# Patient Record
Sex: Male | Born: 1945 | Race: White | Hispanic: No | Marital: Married | Smoking: Former smoker
Health system: Southern US, Community
[De-identification: ages and names within clinical notes are randomized; demographics above are authoritative.]

## PROBLEM LIST (undated history)

## (undated) DIAGNOSIS — J449 Chronic obstructive pulmonary disease, unspecified: Secondary | ICD-10-CM

## (undated) DIAGNOSIS — I10 Essential (primary) hypertension: Secondary | ICD-10-CM

## (undated) DIAGNOSIS — I219 Acute myocardial infarction, unspecified: Secondary | ICD-10-CM

## (undated) DIAGNOSIS — G4730 Sleep apnea, unspecified: Secondary | ICD-10-CM

## (undated) DIAGNOSIS — I509 Heart failure, unspecified: Secondary | ICD-10-CM

## (undated) DIAGNOSIS — C3490 Malignant neoplasm of unspecified part of unspecified bronchus or lung: Secondary | ICD-10-CM

## (undated) DIAGNOSIS — K219 Gastro-esophageal reflux disease without esophagitis: Secondary | ICD-10-CM

## (undated) DIAGNOSIS — K2980 Duodenitis without bleeding: Secondary | ICD-10-CM

## (undated) DIAGNOSIS — M1990 Unspecified osteoarthritis, unspecified site: Secondary | ICD-10-CM

## (undated) DIAGNOSIS — I2510 Atherosclerotic heart disease of native coronary artery without angina pectoris: Secondary | ICD-10-CM

## (undated) DIAGNOSIS — C801 Malignant (primary) neoplasm, unspecified: Secondary | ICD-10-CM

## (undated) DIAGNOSIS — K5790 Diverticulosis of intestine, part unspecified, without perforation or abscess without bleeding: Secondary | ICD-10-CM

## (undated) DIAGNOSIS — E7800 Pure hypercholesterolemia, unspecified: Secondary | ICD-10-CM

## (undated) DIAGNOSIS — T8859XA Other complications of anesthesia, initial encounter: Secondary | ICD-10-CM

## (undated) DIAGNOSIS — K635 Polyp of colon: Secondary | ICD-10-CM

## (undated) DIAGNOSIS — I499 Cardiac arrhythmia, unspecified: Secondary | ICD-10-CM

## (undated) DIAGNOSIS — K648 Other hemorrhoids: Secondary | ICD-10-CM

## (undated) DIAGNOSIS — E119 Type 2 diabetes mellitus without complications: Secondary | ICD-10-CM

## (undated) HISTORY — PX: BACK SURGERY: SHX140

## (undated) HISTORY — PX: CORONARY ANGIOPLASTY: SHX604

---

## 2009-08-08 ENCOUNTER — Ambulatory Visit: Payer: Self-pay | Admitting: Family Medicine

## 2009-09-14 ENCOUNTER — Ambulatory Visit: Payer: Self-pay | Admitting: Oncology

## 2009-09-17 HISTORY — PX: OTHER SURGICAL HISTORY: SHX169

## 2009-09-17 HISTORY — PX: CORONARY ARTERY BYPASS GRAFT: SHX141

## 2009-09-19 ENCOUNTER — Ambulatory Visit: Payer: Self-pay | Admitting: Cardiovascular Disease

## 2009-10-10 LAB — CBC WITH DIFFERENTIAL (CANCER CENTER ONLY)
BASO#: 0 10*3/uL (ref 0.0–0.2)
BASO%: 0.5 % (ref 0.0–2.0)
EOS%: 4 % (ref 0.0–7.0)
Eosinophils Absolute: 0.3 10*3/uL (ref 0.0–0.5)
HCT: 36.5 % — ABNORMAL LOW (ref 38.7–49.9)
HGB: 12.6 g/dL — ABNORMAL LOW (ref 13.0–17.1)
LYMPH#: 1.4 10*3/uL (ref 0.9–3.3)
LYMPH%: 22.6 % (ref 14.0–48.0)
MCH: 29.6 pg (ref 28.0–33.4)
MCHC: 34.6 g/dL (ref 32.0–35.9)
MCV: 86 fL (ref 82–98)
MONO#: 0.4 10*3/uL (ref 0.1–0.9)
MONO%: 6.2 % (ref 0.0–13.0)
NEUT#: 4.2 10*3/uL (ref 1.5–6.5)
NEUT%: 66.7 % (ref 40.0–80.0)
Platelets: 361 10*3/uL (ref 145–400)
RBC: 4.26 10*6/uL (ref 4.20–5.70)
RDW: 13.6 % (ref 10.5–14.6)
WBC: 6.3 10*3/uL (ref 4.0–10.0)

## 2009-10-10 LAB — CMP (CANCER CENTER ONLY)
ALT(SGPT): 40 U/L (ref 10–47)
AST: 29 U/L (ref 11–38)
Albumin: 3.7 g/dL (ref 3.3–5.5)
Alkaline Phosphatase: 93 U/L — ABNORMAL HIGH (ref 26–84)
BUN, Bld: 25 mg/dL — ABNORMAL HIGH (ref 7–22)
CO2: 29 mEq/L (ref 18–33)
Calcium: 9.2 mg/dL (ref 8.0–10.3)
Chloride: 100 mEq/L (ref 98–108)
Creat: 0.8 mg/dl (ref 0.6–1.2)
Glucose, Bld: 142 mg/dL — ABNORMAL HIGH (ref 73–118)
Potassium: 4.8 mEq/L — ABNORMAL HIGH (ref 3.3–4.7)
Sodium: 147 mEq/L — ABNORMAL HIGH (ref 128–145)
Total Bilirubin: 0.4 mg/dl (ref 0.20–1.60)
Total Protein: 7.6 g/dL (ref 6.4–8.1)

## 2009-10-10 LAB — LACTATE DEHYDROGENASE: LDH: 172 U/L (ref 94–250)

## 2009-12-20 ENCOUNTER — Ambulatory Visit: Payer: Self-pay | Admitting: Oncology

## 2010-01-03 LAB — CBC WITH DIFFERENTIAL (CANCER CENTER ONLY)
BASO#: 0 10*3/uL (ref 0.0–0.2)
BASO%: 0.5 % (ref 0.0–2.0)
EOS%: 2.3 % (ref 0.0–7.0)
Eosinophils Absolute: 0.1 10*3/uL (ref 0.0–0.5)
HCT: 34.3 % — ABNORMAL LOW (ref 38.7–49.9)
HGB: 12.1 g/dL — ABNORMAL LOW (ref 13.0–17.1)
LYMPH#: 1.4 10*3/uL (ref 0.9–3.3)
LYMPH%: 23.9 % (ref 14.0–48.0)
MCH: 30.1 pg (ref 28.0–33.4)
MCHC: 35.3 g/dL (ref 32.0–35.9)
MCV: 85 fL (ref 82–98)
MONO#: 0.5 10*3/uL (ref 0.1–0.9)
MONO%: 9 % (ref 0.0–13.0)
NEUT#: 3.7 10*3/uL (ref 1.5–6.5)
NEUT%: 64.3 % (ref 40.0–80.0)
Platelets: 229 10*3/uL (ref 145–400)
RBC: 4.02 10*6/uL — ABNORMAL LOW (ref 4.20–5.70)
RDW: 13.5 % (ref 10.5–14.6)
WBC: 5.7 10*3/uL (ref 4.0–10.0)

## 2010-01-03 LAB — CMP (CANCER CENTER ONLY)
ALT(SGPT): 23 U/L (ref 10–47)
AST: 21 U/L (ref 11–38)
Albumin: 3.9 g/dL (ref 3.3–5.5)
Alkaline Phosphatase: 73 U/L (ref 26–84)
BUN, Bld: 18 mg/dL (ref 7–22)
CO2: 27 mEq/L (ref 18–33)
Calcium: 8.9 mg/dL (ref 8.0–10.3)
Chloride: 103 mEq/L (ref 98–108)
Creat: 0.9 mg/dl (ref 0.6–1.2)
Glucose, Bld: 263 mg/dL — ABNORMAL HIGH (ref 73–118)
Potassium: 4.7 mEq/L (ref 3.3–4.7)
Sodium: 137 mEq/L (ref 128–145)
Total Bilirubin: 0.5 mg/dl (ref 0.20–1.60)
Total Protein: 7 g/dL (ref 6.4–8.1)

## 2010-01-15 ENCOUNTER — Ambulatory Visit: Payer: Self-pay | Admitting: Oncology

## 2011-12-01 ENCOUNTER — Ambulatory Visit: Payer: Self-pay | Admitting: Rheumatology

## 2011-12-12 ENCOUNTER — Other Ambulatory Visit: Payer: Self-pay | Admitting: Neurological Surgery

## 2011-12-15 NOTE — Pre-Procedure Instructions (Signed)
19 Erik Collins  12/15/2011   Your procedure is scheduled on:  Wednesday October 30   Report to [facility] at 1:00 PM.  Call this number if you have problems the morning of surgery: [phone]   Remember:   Do not eat or drink:After Midnight.    Take these medicines the morning of surgery with A SIP OF WATER: Carvedilol (Coreg), omeprazole (Prilosec). Flexeril (cyclobenzaprine) and Ultram (tramadol) if needed.    Do not wear jewelry, make-up or nail polish.  Do not wear lotions, powders, or perfumes. You may wear deodorant.  Do not shave 48 hours prior to surgery. Men may shave face and neck.  Do not bring valuables to the hospital.  Contacts, dentures or bridgework may not be worn into surgery.  Leave suitcase in the car. After surgery it may be brought to your room.  For patients admitted to the hospital, checkout time is 11:00 AM the day of discharge.   Patients discharged the day of surgery will not be allowed to drive home.  Name and phone number of your driver: NA  Special Instructions: Shower using CHG 2 nights before surgery and the night before surgery.  If you shower the day of surgery use CHG.  Use special wash - you have one bottle of CHG for all showers.  You should use approximately 1/3 of the bottle for each shower.   Please read over the following fact sheets that you were given: Pain Booklet, Coughing and Deep Breathing and Surgical Site Infection Prevention

## 2011-12-16 ENCOUNTER — Encounter (HOSPITAL_COMMUNITY)
Admission: RE | Admit: 2011-12-16 | Discharge: 2011-12-16 | Disposition: A | Discharge status: Home / Self Care | Payer: Medicare Other | Source: Ambulatory Visit | Attending: Neurological Surgery | Admitting: Neurological Surgery

## 2011-12-16 ENCOUNTER — Encounter (HOSPITAL_COMMUNITY): Payer: Self-pay

## 2011-12-16 HISTORY — DX: Type 2 diabetes mellitus without complications: E11.9

## 2011-12-16 HISTORY — DX: Pure hypercholesterolemia, unspecified: E78.00

## 2011-12-16 HISTORY — DX: Chronic obstructive pulmonary disease, unspecified: J44.9

## 2011-12-16 HISTORY — DX: Atherosclerotic heart disease of native coronary artery without angina pectoris: I25.10

## 2011-12-16 HISTORY — DX: Essential (primary) hypertension: I10

## 2011-12-16 HISTORY — DX: Gastro-esophageal reflux disease without esophagitis: K21.9

## 2011-12-16 HISTORY — DX: Malignant (primary) neoplasm, unspecified: C80.1

## 2011-12-16 HISTORY — DX: Acute myocardial infarction, unspecified: I21.9

## 2011-12-16 LAB — CBC WITH DIFFERENTIAL/PLATELET
Basophils Absolute: 0 10*3/uL (ref 0.0–0.1)
Basophils Relative: 0 % (ref 0–1)
Eosinophils Absolute: 0.1 10*3/uL (ref 0.0–0.7)
Eosinophils Relative: 1 % (ref 0–5)
HCT: 36.9 % — ABNORMAL LOW (ref 39.0–52.0)
Hemoglobin: 12.4 g/dL — ABNORMAL LOW (ref 13.0–17.0)
Lymphocytes Relative: 29 % (ref 12–46)
Lymphs Abs: 1.8 10*3/uL (ref 0.7–4.0)
MCH: 30.2 pg (ref 26.0–34.0)
MCHC: 33.6 g/dL (ref 30.0–36.0)
MCV: 89.8 fL (ref 78.0–100.0)
Monocytes Absolute: 0.5 10*3/uL (ref 0.1–1.0)
Monocytes Relative: 8 % (ref 3–12)
Neutro Abs: 3.9 10*3/uL (ref 1.7–7.7)
Neutrophils Relative %: 62 % (ref 43–77)
Platelets: 188 10*3/uL (ref 150–400)
RBC: 4.11 MIL/uL — ABNORMAL LOW (ref 4.22–5.81)
RDW: 13.4 % (ref 11.5–15.5)
WBC: 6.2 10*3/uL (ref 4.0–10.5)

## 2011-12-16 LAB — PROTIME-INR
INR: 0.97 (ref 0.00–1.49)
Prothrombin Time: 12.8 seconds (ref 11.6–15.2)

## 2011-12-16 LAB — BASIC METABOLIC PANEL
BUN: 18 mg/dL (ref 6–23)
CO2: 26 mEq/L (ref 19–32)
Calcium: 9.3 mg/dL (ref 8.4–10.5)
Chloride: 99 mEq/L (ref 96–112)
Creatinine, Ser: 0.94 mg/dL (ref 0.50–1.35)
GFR calc Af Amer: >90 mL/min (ref >90)
GFR calc non Af Amer: 85 mL/min — ABNORMAL LOW (ref >90)
Glucose, Bld: 233 mg/dL — ABNORMAL HIGH (ref 70–99)
Potassium: 4.4 mEq/L (ref 3.5–5.1)
Sodium: 134 mEq/L — ABNORMAL LOW (ref 135–145)

## 2011-12-16 LAB — SURGICAL PCR SCREEN
MRSA, PCR: NEGATIVE
Staphylococcus aureus: POSITIVE — AB

## 2011-12-16 MED ORDER — DEXAMETHASONE SODIUM PHOSPHATE 10 MG/ML IJ SOLN: 10.0000 mg | INTRAMUSCULAR | Status: DC

## 2011-12-16 MED ORDER — CEFAZOLIN SODIUM-DEXTROSE 2-3 GM-% IV SOLR: 2.0000 g | INTRAVENOUS | Status: DC

## 2011-12-16 NOTE — Progress Notes (Addendum)
Refaxed request for medical records - [facility], Drs. Welton Flakes (PCP and cardiologist).  Will leave chart for review by anesthesia.   Records were eventually received from [facility], however EKG and CXR were both more than one year old.

## 2011-12-16 NOTE — Progress Notes (Addendum)
Pt states EKG and CXR done within last year. Requested records from [facility]: EKG, CXR, last OV, cardiac cath, ECHO, stress test as available.   Pt reported brief episode of sharp right sided chest pain several days ago, denied any sweating, SOB, or feeling unwell in any other way. States did not feel like his heart attack in 09/2009.

## 2011-12-16 NOTE — Consult Note (Signed)
Anesthesia Chart Review:  Patient is a 66 year old male scheduled for a right L5-S1 microdiskectomy on 12/17/11 (afternoon) by Dr. Yetta Barre.  His PAT appointment was on 12/16/11.  I was not asked to see him at his PAT visit.  I was given his chart to review at 1400; however I still do not have any cardiac records, EKG, or CXR to review.  (Records re-requested and a message was left for Erie Noe at Dr. Yetta Barre' office.)  There is no answer at the phone number listed for the patient.   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    Labs noted.  Glucose is 233.  I will follow-up with records when available.  Shonna Chock, PA-C 12/16/11 1401  Update: 12/16/11 1527 I just received most recent records from [facility] ([facility])/(Cardiologist Dr. Welton Flakes).  Patient was last seen on 03/11/11.  Notes indicate patient underwent CABG X 3 (LIMA to LAD, SVG to OM1, SVG to RCA) and "lobectomy" for stage I adenocarcinoma of the lung in August 2011 at [facility].  He had a stress and echo done at [facility] in January 2013 (see below).  Echo on 03/11/11 showed mildly dilated left atrium with rest of the chambers of normal size. Borderline normal LV systolic function, EF 50%. Normal wall motion. Mild left ventricular hypertrophy with mild diastolic dysfunction. Mild tricuspid regurgitation. Normal pulmonary artery pressure. Mild mitral regurgitation. Fibro-calcified aortic valve without evidence of stenosis. Mild aortic regurgitation. No pericardial effusion.  Nuclear stress test on 03/10/11 showed EF 50%. Fixed infero-apical defect most likely due to prior infarct versus diaphragmatic attenuation.  His last cardiac cath on 09/19/09 was done at [facility] prior to his CABG and showed three-vessel CAD with occluded proximal left circumflex with collaterals from the LAD, which itself has eccentric proximal high-grade lesion. RCA also  had 2 mid high-grade lesions with LVEF 40%.  His EKG and CXR from [facility] were both > 62 year old, so he will need both repeated on arrival.  I reviewed above with Anesthesiologist Dr. Katrinka Blazing.  Clinical correlation on the day of surgery.  If he is asymptomatic or at baseline from a cardiopulmonary standpoint then anticipate he can proceed.  (There is no record that he sees a Pulmonologist, but I did ask his Short Stay nurse to request these records tomorrow, if applicable.)  Shonna Chock, PA-C

## 2011-12-17 ENCOUNTER — Inpatient Hospital Stay (HOSPITAL_COMMUNITY): Payer: Medicare Other

## 2011-12-17 ENCOUNTER — Encounter (HOSPITAL_COMMUNITY): Payer: Self-pay | Admitting: Vascular Surgery

## 2011-12-17 ENCOUNTER — Inpatient Hospital Stay (HOSPITAL_COMMUNITY): Payer: Medicare Other | Admitting: Vascular Surgery

## 2011-12-17 ENCOUNTER — Encounter (HOSPITAL_COMMUNITY): Payer: Self-pay | Admitting: *Deleted

## 2011-12-17 ENCOUNTER — Encounter (HOSPITAL_COMMUNITY)
Admission: RE | Disposition: A | Discharge status: Home / Self Care | Payer: Self-pay | Source: Ambulatory Visit | Attending: Neurological Surgery

## 2011-12-17 ENCOUNTER — Inpatient Hospital Stay (HOSPITAL_COMMUNITY)
Admission: RE | Admit: 2011-12-17 | Discharge: 2011-12-17 | Disposition: A | Discharge status: Home / Self Care | Payer: Medicare Other | Source: Ambulatory Visit | Attending: Neurological Surgery | Admitting: Neurological Surgery

## 2011-12-17 ENCOUNTER — Inpatient Hospital Stay (HOSPITAL_COMMUNITY)
Admission: RE | Admit: 2011-12-17 | Discharge: 2011-12-17 | DRG: 491 | Disposition: A | Discharge status: Home / Self Care | Payer: Medicare Other | Source: Ambulatory Visit | Attending: Neurological Surgery | Admitting: Neurological Surgery

## 2011-12-17 ENCOUNTER — Encounter (HOSPITAL_COMMUNITY): Payer: Self-pay | Admitting: Neurological Surgery

## 2011-12-17 DIAGNOSIS — I10 Essential (primary) hypertension: Secondary | ICD-10-CM | POA: Diagnosis present

## 2011-12-17 DIAGNOSIS — J4489 Other specified chronic obstructive pulmonary disease: Secondary | ICD-10-CM | POA: Diagnosis present

## 2011-12-17 DIAGNOSIS — Z7982 Long term (current) use of aspirin: Secondary | ICD-10-CM

## 2011-12-17 DIAGNOSIS — Z85118 Personal history of other malignant neoplasm of bronchus and lung: Secondary | ICD-10-CM

## 2011-12-17 DIAGNOSIS — J449 Chronic obstructive pulmonary disease, unspecified: Secondary | ICD-10-CM | POA: Diagnosis present

## 2011-12-17 DIAGNOSIS — K219 Gastro-esophageal reflux disease without esophagitis: Secondary | ICD-10-CM | POA: Diagnosis present

## 2011-12-17 DIAGNOSIS — Z951 Presence of aortocoronary bypass graft: Secondary | ICD-10-CM

## 2011-12-17 DIAGNOSIS — E119 Type 2 diabetes mellitus without complications: Secondary | ICD-10-CM | POA: Diagnosis present

## 2011-12-17 DIAGNOSIS — I2510 Atherosclerotic heart disease of native coronary artery without angina pectoris: Secondary | ICD-10-CM | POA: Diagnosis present

## 2011-12-17 DIAGNOSIS — M5126 Other intervertebral disc displacement, lumbar region: Principal | ICD-10-CM | POA: Diagnosis present

## 2011-12-17 DIAGNOSIS — E7800 Pure hypercholesterolemia, unspecified: Secondary | ICD-10-CM | POA: Diagnosis present

## 2011-12-17 DIAGNOSIS — Z79899 Other long term (current) drug therapy: Secondary | ICD-10-CM

## 2011-12-17 DIAGNOSIS — Z01812 Encounter for preprocedural laboratory examination: Secondary | ICD-10-CM

## 2011-12-17 DIAGNOSIS — I252 Old myocardial infarction: Secondary | ICD-10-CM

## 2011-12-17 HISTORY — PX: LUMBAR LAMINECTOMY/DECOMPRESSION MICRODISCECTOMY: SHX5026

## 2011-12-17 LAB — GLUCOSE, CAPILLARY
Glucose-Capillary: 125 mg/dL — ABNORMAL HIGH (ref 70–99)
Glucose-Capillary: 83 mg/dL (ref 70–99)

## 2011-12-17 SURGERY — LUMBAR LAMINECTOMY/DECOMPRESSION MICRODISCECTOMY 1 LEVEL
Anesthesia: General | Site: Spine Lumbar | Laterality: Right | Wound class: Clean

## 2011-12-17 MED ORDER — MEPERIDINE HCL 25 MG/ML IJ SOLN: 6.2500 mg | INTRAMUSCULAR | Status: DC | PRN

## 2011-12-17 MED ORDER — ACETAMINOPHEN 325 MG PO TABS: 650.0000 mg | ORAL_TABLET | ORAL | Status: DC | PRN

## 2011-12-17 MED ORDER — OXYCODONE HCL 5 MG PO TABS: 5.0000 mg | ORAL_TABLET | Freq: Once | ORAL | Status: DC | PRN

## 2011-12-17 MED ORDER — ACETAMINOPHEN 650 MG RE SUPP: 650.0000 mg | RECTAL | Status: DC | PRN

## 2011-12-17 MED ORDER — ENALAPRIL MALEATE 5 MG PO TABS: 5.0000 mg | ORAL_TABLET | Freq: Every day | ORAL | Status: DC

## 2011-12-17 MED ORDER — PHENOL 1.4 % MT LIQD: 1.0000 | OROMUCOSAL | Status: DC | PRN

## 2011-12-17 MED ORDER — SODIUM CHLORIDE 0.9 % IV SOLN
INTRAVENOUS | Status: AC
  Filled 2011-12-17: qty 500

## 2011-12-17 MED ORDER — HYDROMORPHONE HCL PF 1 MG/ML IJ SOLN
0.2500 mg | INTRAMUSCULAR | Status: DC | PRN
  Administered 2011-12-17: 0.5 mg via INTRAVENOUS

## 2011-12-17 MED ORDER — SODIUM CHLORIDE 0.9 % IR SOLN
Status: DC | PRN
  Administered 2011-12-17: 15:00:00

## 2011-12-17 MED ORDER — CARVEDILOL 25 MG PO TABS
25.0000 mg | ORAL_TABLET | Freq: Two times a day (BID) | ORAL | Status: DC
  Filled 2011-12-17: qty 1

## 2011-12-17 MED ORDER — ONDANSETRON HCL 4 MG/2ML IJ SOLN
INTRAMUSCULAR | Status: DC | PRN
  Administered 2011-12-17: 4 mg via INTRAVENOUS

## 2011-12-17 MED ORDER — ARTIFICIAL TEARS OP OINT
TOPICAL_OINTMENT | OPHTHALMIC | Status: DC | PRN
  Administered 2011-12-17: 1 via OPHTHALMIC

## 2011-12-17 MED ORDER — METFORMIN HCL 500 MG PO TABS
1000.0000 mg | ORAL_TABLET | Freq: Two times a day (BID) | ORAL | Status: DC
  Filled 2011-12-17: qty 2

## 2011-12-17 MED ORDER — CEFAZOLIN SODIUM 1-5 GM-% IV SOLN
1.0000 g | Freq: Three times a day (TID) | INTRAVENOUS | Status: DC
  Filled 2011-12-17 (×2): qty 50

## 2011-12-17 MED ORDER — DEXAMETHASONE 4 MG PO TABS: 4.0000 mg | ORAL_TABLET | Freq: Four times a day (QID) | ORAL | Status: DC

## 2011-12-17 MED ORDER — HYDROCODONE-ACETAMINOPHEN 5-325 MG PO TABS
1.0000 | ORAL_TABLET | ORAL | Status: DC | PRN
  Administered 2011-12-17: 2 via ORAL
  Filled 2011-12-17: qty 2

## 2011-12-17 MED ORDER — MIDAZOLAM HCL 5 MG/5ML IJ SOLN
INTRAMUSCULAR | Status: DC | PRN
  Administered 2011-12-17: 1 mg via INTRAVENOUS

## 2011-12-17 MED ORDER — LACTATED RINGERS IV SOLN
INTRAVENOUS | Status: DC | PRN
  Administered 2011-12-17 (×2): via INTRAVENOUS

## 2011-12-17 MED ORDER — PROMETHAZINE HCL 25 MG/ML IJ SOLN: 6.2500 mg | INTRAMUSCULAR | Status: DC | PRN

## 2011-12-17 MED ORDER — ONDANSETRON HCL 4 MG/2ML IJ SOLN: 4.0000 mg | INTRAMUSCULAR | Status: DC | PRN

## 2011-12-17 MED ORDER — GLYCOPYRROLATE 0.2 MG/ML IJ SOLN
INTRAMUSCULAR | Status: DC | PRN
  Administered 2011-12-17: .6 mg via INTRAVENOUS

## 2011-12-17 MED ORDER — HYDROMORPHONE HCL PF 1 MG/ML IJ SOLN
INTRAMUSCULAR | Status: AC
  Filled 2011-12-17: qty 1

## 2011-12-17 MED ORDER — LIDOCAINE HCL (CARDIAC) 20 MG/ML IV SOLN
INTRAVENOUS | Status: DC | PRN
  Administered 2011-12-17: 50 mg via INTRAVENOUS

## 2011-12-17 MED ORDER — SODIUM CHLORIDE 0.9 % IJ SOLN: 3.0000 mL | Freq: Two times a day (BID) | INTRAMUSCULAR | Status: DC

## 2011-12-17 MED ORDER — ROCURONIUM BROMIDE 100 MG/10ML IV SOLN
INTRAVENOUS | Status: DC | PRN
  Administered 2011-12-17: 50 mg via INTRAVENOUS

## 2011-12-17 MED ORDER — MORPHINE SULFATE 2 MG/ML IJ SOLN
1.0000 mg | INTRAMUSCULAR | Status: DC | PRN
  Administered 2011-12-17: 2 mg via INTRAVENOUS
  Filled 2011-12-17: qty 1

## 2011-12-17 MED ORDER — CLONAZEPAM 0.5 MG PO TABS: 1.0000 mg | ORAL_TABLET | Freq: Every evening | ORAL | Status: DC | PRN

## 2011-12-17 MED ORDER — MENTHOL 3 MG MT LOZG: 1.0000 | LOZENGE | OROMUCOSAL | Status: DC | PRN

## 2011-12-17 MED ORDER — PANTOPRAZOLE SODIUM 40 MG PO TBEC: 40.0000 mg | DELAYED_RELEASE_TABLET | Freq: Every day | ORAL | Status: DC

## 2011-12-17 MED ORDER — ACETAMINOPHEN 10 MG/ML IV SOLN
INTRAVENOUS | Status: AC
  Administered 2011-12-17: 1000 mg via INTRAVENOUS
  Filled 2011-12-17: qty 100

## 2011-12-17 MED ORDER — HEMOSTATIC AGENTS (NO CHARGE) OPTIME
TOPICAL | Status: DC | PRN
  Administered 2011-12-17: 1 via TOPICAL

## 2011-12-17 MED ORDER — FENTANYL CITRATE 0.05 MG/ML IJ SOLN
INTRAMUSCULAR | Status: DC | PRN
  Administered 2011-12-17 (×3): 50 ug via INTRAVENOUS

## 2011-12-17 MED ORDER — NEOSTIGMINE METHYLSULFATE 1 MG/ML IJ SOLN
INTRAMUSCULAR | Status: DC | PRN
  Administered 2011-12-17: 3 mg via INTRAVENOUS

## 2011-12-17 MED ORDER — PROPOFOL 10 MG/ML IV BOLUS
INTRAVENOUS | Status: DC | PRN
  Administered 2011-12-17: 150 mg via INTRAVENOUS
  Administered 2011-12-17: 50 mg via INTRAVENOUS

## 2011-12-17 MED ORDER — OXYCODONE HCL 5 MG/5ML PO SOLN
5.0000 mg | Freq: Once | ORAL | Status: DC | PRN
Start: 2011-12-17 — End: 2011-12-17

## 2011-12-17 MED ORDER — POTASSIUM CHLORIDE IN NACL 20-0.9 MEQ/L-% IV SOLN
INTRAVENOUS | Status: DC
  Filled 2011-12-17 (×2): qty 1000

## 2011-12-17 MED ORDER — SODIUM CHLORIDE 0.9 % IJ SOLN: 3.0000 mL | INTRAMUSCULAR | Status: DC | PRN

## 2011-12-17 MED ORDER — THROMBIN 5000 UNITS EX KIT
PACK | CUTANEOUS | Status: DC | PRN
  Administered 2011-12-17 (×2): 5000 [IU] via TOPICAL

## 2011-12-17 MED ORDER — CEFAZOLIN SODIUM-DEXTROSE 2-3 GM-% IV SOLR
INTRAVENOUS | Status: AC
  Administered 2011-12-17: 2 g via INTRAVENOUS
  Filled 2011-12-17: qty 50

## 2011-12-17 MED ORDER — PHENYLEPHRINE HCL 10 MG/ML IJ SOLN
INTRAMUSCULAR | Status: DC | PRN
  Administered 2011-12-17 (×2): 80 ug via INTRAVENOUS

## 2011-12-17 MED ORDER — ACETAMINOPHEN 10 MG/ML IV SOLN
1000.0000 mg | Freq: Four times a day (QID) | INTRAVENOUS | Status: DC
Start: 2011-12-17 — End: 2011-12-17
  Administered 2011-12-17: 1000 mg via INTRAVENOUS
  Filled 2011-12-17 (×3): qty 100

## 2011-12-17 MED ORDER — DEXAMETHASONE SODIUM PHOSPHATE 4 MG/ML IJ SOLN
4.0000 mg | Freq: Four times a day (QID) | INTRAMUSCULAR | Status: DC
  Administered 2011-12-17: 4 mg via INTRAVENOUS
  Filled 2011-12-17: qty 1

## 2011-12-17 MED ORDER — 0.9 % SODIUM CHLORIDE (POUR BTL) OPTIME
TOPICAL | Status: DC | PRN
  Administered 2011-12-17: 1000 mL

## 2011-12-17 MED ORDER — BACITRACIN 50000 UNITS IM SOLR
INTRAMUSCULAR | Status: AC
  Filled 2011-12-17: qty 1

## 2011-12-17 MED ORDER — BUPIVACAINE HCL (PF) 0.25 % IJ SOLN
INTRAMUSCULAR | Status: DC | PRN
  Administered 2011-12-17: 2 mL
  Administered 2011-12-17: 10 mL

## 2011-12-17 SURGICAL SUPPLY — 45 items
BAG DECANTER FOR FLEXI CONT (MISCELLANEOUS) ×2 IMPLANT
BENZOIN TINCTURE PRP APPL 2/3 (GAUZE/BANDAGES/DRESSINGS) ×2 IMPLANT
BUR MATCHSTICK NEURO 3.0 LAGG (BURR) ×2 IMPLANT
CANISTER SUCTION 2500CC (MISCELLANEOUS) ×2 IMPLANT
CLOTH BEACON ORANGE TIMEOUT ST (SAFETY) ×2 IMPLANT
CONT SPEC 4OZ CLIKSEAL STRL BL (MISCELLANEOUS) ×2 IMPLANT
DERMABOND ADHESIVE PROPEN (GAUZE/BANDAGES/DRESSINGS) ×1
DERMABOND ADVANCED .7 DNX6 (GAUZE/BANDAGES/DRESSINGS) ×1 IMPLANT
DRAPE LAPAROTOMY 100X72X124 (DRAPES) ×2 IMPLANT
DRAPE MICROSCOPE LEICA (MISCELLANEOUS) ×2 IMPLANT
DRAPE MICROSCOPE ZEISS OPMI (DRAPES) ×2 IMPLANT
DRAPE POUCH INSTRU U-SHP 10X18 (DRAPES) ×2 IMPLANT
DRAPE SURG 17X23 STRL (DRAPES) ×2 IMPLANT
DRESSING TELFA 8X3 (GAUZE/BANDAGES/DRESSINGS) ×2 IMPLANT
DRSG OPSITE 4X5.5 SM (GAUZE/BANDAGES/DRESSINGS) ×2 IMPLANT
DURAPREP 26ML APPLICATOR (WOUND CARE) ×2 IMPLANT
ELECT REM PT RETURN 9FT ADLT (ELECTROSURGICAL) ×2
ELECTRODE REM PT RTRN 9FT ADLT (ELECTROSURGICAL) ×1 IMPLANT
GAUZE SPONGE 4X4 16PLY XRAY LF (GAUZE/BANDAGES/DRESSINGS) IMPLANT
GLOVE BIO SURGEON STRL SZ8 (GLOVE) ×2 IMPLANT
GLOVE BIOGEL PI IND STRL 8 (GLOVE) ×3 IMPLANT
GLOVE BIOGEL PI INDICATOR 8 (GLOVE) ×3
GLOVE ECLIPSE 7.5 STRL STRAW (GLOVE) ×8 IMPLANT
GOWN BRE IMP SLV AUR LG STRL (GOWN DISPOSABLE) ×2 IMPLANT
GOWN BRE IMP SLV AUR XL STRL (GOWN DISPOSABLE) ×2 IMPLANT
GOWN STRL REIN 2XL LVL4 (GOWN DISPOSABLE) ×2 IMPLANT
HEMOSTAT POWDER KIT SURGIFOAM (HEMOSTASIS) IMPLANT
KIT BASIN OR (CUSTOM PROCEDURE TRAY) ×2 IMPLANT
KIT ROOM TURNOVER OR (KITS) ×2 IMPLANT
NEEDLE HYPO 25X1 1.5 SAFETY (NEEDLE) ×2 IMPLANT
NEEDLE SPNL 20GX3.5 QUINCKE YW (NEEDLE) ×2 IMPLANT
NS IRRIG 1000ML POUR BTL (IV SOLUTION) ×2 IMPLANT
PACK LAMINECTOMY NEURO (CUSTOM PROCEDURE TRAY) ×2 IMPLANT
PAD ARMBOARD 7.5X6 YLW CONV (MISCELLANEOUS) ×10 IMPLANT
RUBBERBAND STERILE (MISCELLANEOUS) ×4 IMPLANT
SPONGE SURGIFOAM ABS GEL SZ50 (HEMOSTASIS) ×2 IMPLANT
STRIP CLOSURE SKIN 1/2X4 (GAUZE/BANDAGES/DRESSINGS) ×2 IMPLANT
SUT VIC AB 0 CT1 18XCR BRD8 (SUTURE) ×1 IMPLANT
SUT VIC AB 0 CT1 8-18 (SUTURE) ×1
SUT VIC AB 2-0 CP2 18 (SUTURE) ×2 IMPLANT
SUT VIC AB 3-0 SH 8-18 (SUTURE) ×2 IMPLANT
SYR 20ML ECCENTRIC (SYRINGE) ×2 IMPLANT
TOWEL OR 17X24 6PK STRL BLUE (TOWEL DISPOSABLE) ×2 IMPLANT
TOWEL OR 17X26 10 PK STRL BLUE (TOWEL DISPOSABLE) ×2 IMPLANT
WATER STERILE IRR 1000ML POUR (IV SOLUTION) ×2 IMPLANT

## 2011-12-17 NOTE — H&P (Signed)
Subjective: Patient is a 66 y.o. male admitted for R L5-S1 microdiskectomy. Onset of symptoms was several weeks ago, gradually worsening since that time.  The pain is rated severe, and is located at the across the lower back and radiates to RLE. The pain is described as aching and sharp and occurs all day. The symptoms have been progressive. Symptoms are exacerbated by exercise. MRI or CT showed HNP L5-S1 right.   Past Medical History  Diagnosis Date  . Hypertension   . COPD (chronic obstructive pulmonary disease)   . Diabetes mellitus without complication   . GERD (gastroesophageal reflux disease)   . Cancer     hx lung cancer  . Hypercholesterolemia   . Myocardial infarction     09/2009 f/up with Dr. Adrian Blackwater  . Coronary artery disease     s/p CABG    Past Surgical History  Procedure Date  . Left lower lobectomy 09/2009    at [facility]  . Coronary artery bypass graft 09/2009    quadruple, done at [facility]    Prior to Admission medications   Medication Sig Start Date End Date Taking? Authorizing Provider  acetaminophen (TYLENOL) 325 MG tablet Take 650 mg by mouth every 6 (six) hours as needed. For pain   Yes Historical Provider, MD  aspirin EC 81 MG tablet Take 81 mg by mouth daily.   Yes Historical Provider, MD  atorvastatin (LIPITOR) 20 MG tablet Take 20 mg by mouth daily.   Yes Historical Provider, MD  carvedilol (COREG) 25 MG tablet Take 25 mg by mouth 2 (two) times daily with a meal.   Yes Historical Provider, MD  clonazePAM (KLONOPIN) 1 MG tablet Take 1 mg by mouth at bedtime as needed. For sleep   Yes Historical Provider, MD  cyanocobalamin 100 MCG tablet Take 100 mcg by mouth daily.   Yes Historical Provider, MD  cyclobenzaprine (FLEXERIL) 5 MG tablet Take 5 mg by mouth 3 (three) times daily as needed. For muscle spasms   Yes Historical Provider, MD  enalapril (VASOTEC) 5 MG tablet Take 5 mg by mouth daily.   Yes Historical Provider, MD  fish oil-omega-3 fatty acids 1000 MG  capsule Take 1 g by mouth 3 (three) times daily.   Yes Historical Provider, MD  metFORMIN (GLUCOPHAGE) 500 MG tablet Take 1,000 mg by mouth 2 (two) times daily with a meal.   Yes Historical Provider, MD  mupirocin ointment (BACTROBAN) 2 % Place into the nose 2 (two) times daily. 12/16/11 12/20/11 Yes Historical Provider, MD  omeprazole (PRILOSEC) 20 MG capsule Take 20 mg by mouth daily.   Yes Historical Provider, MD  OVER THE COUNTER MEDICATION Take 1 tablet by mouth 2 (two) times daily. Glucosamine Triple Strength   Yes Historical Provider, MD  traMADol (ULTRAM) 50 MG tablet Take 50 mg by mouth 2 (two) times daily as needed. For pain   Yes Historical Provider, MD   No Known Allergies  History  Substance Use Topics  . Smoking status: Former Smoker    Quit date: 09/19/2009  . Smokeless tobacco: Not on file  . Alcohol Use: No    History reviewed. No pertinent family history.   Review of Systems  Positive ROS: neg  All other systems have been reviewed and were otherwise negative with the exception of those mentioned in the HPI and as above.  Objective: Vital signs in last 24 hours: Temp:  [97.9 F (36.6 C)] 97.9 F (36.6 C) (10/30 1215) Pulse Rate:  [67]  67  (10/30 1215) Resp:  [18] 18  (10/30 1215) BP: (128)/(78) 128/78 mmHg (10/30 1215) SpO2:  [95 %] 95 % (10/30 1215)  General Appearance: Alert, cooperative, no distress, appears stated age Head: Normocephalic, without obvious abnormality, atraumatic Eyes: PERRL, conjunctiva/corneas clear, EOM's intact, fundi benign, both eyes      Ears: Normal TM's and external ear canals, both ears Throat: Lips, mucosa, and tongue normal; teeth and gums normal Neck: Supple, symmetrical, trachea midline, no adenopathy; thyroid: No enlargement/tenderness/nodules; no carotid bruit or JVD Back: Symmetric, no curvature, ROM normal, no CVA tenderness Lungs: Clear to auscultation bilaterally, respirations unlabored Heart: Regular rate and rhythm, S1  and S2 normal, no murmur, rub or gallop Abdomen: Soft, non-tender, bowel sounds active all four quadrants, no masses, no organomegaly Extremities: Extremities normal, atraumatic, no cyanosis or edema Pulses: 2+ and symmetric all extremities Skin: Skin color, texture, turgor normal, no rashes or lesions  NEUROLOGIC:   Mental status: Alert and oriented x4,  no aphasia, good attention span, fund of knowledge, and memory Motor Exam - grossly normal Sensory Exam - grossly normal Reflexes: 1+ Coordination - grossly normal Gait - grossly normal Balance - grossly normal Cranial Nerves: I: smell Not tested  II: visual acuity  OS: nl    OD: nl  II: visual fields Full to confrontation  II: pupils Equal, round, reactive to light  III,VII: ptosis None  III,IV,VI: extraocular muscles  Full ROM  V: mastication Normal  V: facial light touch sensation  Normal  V,VII: corneal reflex  Present  VII: facial muscle function - upper  Normal  VII: facial muscle function - lower Normal  VIII: hearing Not tested  IX: soft palate elevation  Normal  IX,X: gag reflex Present  XI: trapezius strength  5/5  XI: sternocleidomastoid strength 5/5  XI: neck flexion strength  5/5  XII: tongue strength  Normal    Data Review Lab Results  Component Value Date   WBC 6.2 12/16/2011   HGB 12.4* 12/16/2011   HCT 36.9* 12/16/2011   MCV 89.8 12/16/2011   PLT 188 12/16/2011   Lab Results  Component Value Date   NA 134* 12/16/2011   K 4.4 12/16/2011   CL 99 12/16/2011   CO2 26 12/16/2011   BUN 18 12/16/2011   CREATININE 0.94 12/16/2011   GLUCOSE 233* 12/16/2011   Lab Results  Component Value Date   INR 0.97 12/16/2011    Assessment/Plan: Patient admitted for R L5-S1 microdiscectomy. Patient has failed conservative therapy.  I explained the condition and procedure to the patient and answered any questions.  Patient wishes to proceed with procedure as planned. Understands risks/ benefits and typical  outcomes of procedure.   Erik Collins S 12/17/2011 2:04 PM

## 2011-12-17 NOTE — Plan of Care (Signed)
Problem: Consults Goal: Diagnosis - Spinal Surgery Outcome: Completed/Met Date Met:  12/17/11 Microdiscectomy

## 2011-12-17 NOTE — Preoperative (Signed)
Beta Blockers   Reason not to administer Beta Blockers:Not Applicable 

## 2011-12-17 NOTE — Op Note (Signed)
12/17/2011  3:51 PM  PATIENT:  Erik Collins  66 y.o. 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 Newell Coral  ANESTHESIA:   General  EBL: Minimal ml  Total I/O In: 1000 [I.V.:1000] Out: 3 [Blood:3]  BLOOD ADMINISTERED:none  DRAINS: None   SPECIMEN:  No Specimen  INDICATION FOR PROCEDURE: This patient presented with a right S1 radiculopathy from a herniated disc L5-S1 the right. He tried medical management without relief. Recommended a microdiscectomy.  Patient understood the risks, benefits, and alternatives and potential outcomes and wished to proceed.  PROCEDURE DETAILS: The patient was taken to the operating room and after induction of adequate generalized endotracheal anesthesia, the patient was rolled into the prone position on the Wilson frame and all pressure points were padded. The lumbar region was cleaned and then prepped with DuraPrep and draped in the usual sterile fashion. 5 cc of local anesthesia was injected and then a dorsal midline incision was made and carried down to the lumbo sacral fascia. The fascia was opened and the paraspinous musculature was taken down in a subperiosteal fashion to expose L5-S1 on the right. Intraoperative x-ray confirmed my level, and then I used a combination of the high-speed drill and the Kerrison punches to perform a hemilaminectomy, medial facetectomy, and foraminotomy at L5-S1 on the right. The underlying yellow ligament was opened and removed in a piecemeal fashion to expose the underlying dura and exiting nerve root. I undercut the lateral recess and dissected down until I was medial to and distal to the pedicle. The nerve root was well decompressed. We then gently retracted the nerve  root medially with a retractor, coagulated the epidural venous vasculature, and incised the disc space. A performed a thorough intradiscal discectomy with pituitary rongeurs and curettes, until I had a nice decompression of the nerve root and the midline. I then palpated with a coronary dilator along the nerve root and into the foramen to assure adequate decompression. I felt no more compression of the nerve root. I irrigated with saline solution containing bacitracin. Achieved hemostasis with bipolar cautery, lined the dura with Gelfoam, and then closed the fascia with 0 Vicryl. I closed the subcutaneous tissues with 2-0 Vicryl and the subcuticular tissues with 3-0 Vicryl. The skin was then closed with benzoin and Steri-Strips. The drapes were removed, a sterile dressing was applied. The patient was awakened from general anesthesia and transferred to the recovery room in stable condition. At the end of the procedure all sponge, needle and instrument counts were correct.   PLAN OF CARE: Admit to inpatient   PATIENT DISPOSITION:  PACU - hemodynamically stable.   Delay start of Pharmacological VTE agent (>24hrs) due to surgical blood loss or risk of bleeding:  yes

## 2011-12-17 NOTE — Transfer of Care (Signed)
Immediate Anesthesia Transfer of Care Note  Patient: Erik Collins  Procedure(s) Performed: Procedure(s) (LRB) with comments: LUMBAR LAMINECTOMY/DECOMPRESSION MICRODISCECTOMY 1 LEVEL (Right) - Right Lumbar five-sacral one microdiscectomy  Patient Location: PACU  Anesthesia Type:General  Level of Consciousness: awake, alert  and oriented  Airway & Oxygen Therapy: Patient Spontanous Breathing and Patient connected to nasal cannula oxygen  Post-op Assessment: Report given to PACU RN, Post -op Vital signs reviewed and stable and Patient moving all extremities X 4  Post vital signs: Reviewed and stable  Complications: No apparent anesthesia complications

## 2011-12-17 NOTE — Anesthesia Preprocedure Evaluation (Addendum)
Anesthesia Evaluation  Patient identified by MRN, date of birth, ID band Patient awake    Reviewed: Allergy & Precautions, H&P , NPO status , Patient's Chart, lab work & pertinent test results, reviewed documented beta blocker date and time   Airway Mallampati: I TM Distance: >3 FB Neck ROM: full    Dental  (+) Edentulous Upper and Edentulous Lower   Pulmonary COPD breath sounds clear to auscultation        Cardiovascular hypertension, On Home Beta Blockers + CAD, + Past MI and + CABG Rhythm:Regular Rate:Normal     Neuro/Psych    GI/Hepatic GERD-  Medicated and Controlled,  Endo/Other  diabetes, Well Controlled, Type 2  Renal/GU      Musculoskeletal   Abdominal   Peds  Hematology   Anesthesia Other Findings   Reproductive/Obstetrics                          Anesthesia Physical Anesthesia Plan  ASA: III  Anesthesia Plan: General   Post-op Pain Management:    Induction: Intravenous  Airway Management Planned: Oral ETT  Additional Equipment:   Intra-op Plan:   Post-operative Plan: Extubation in OR  Informed Consent:   Dental Advisory Given  Plan Discussed with: Anesthesiologist, CRNA and Surgeon  Anesthesia Plan Comments:        Anesthesia Quick Evaluation

## 2011-12-17 NOTE — Anesthesia Postprocedure Evaluation (Signed)
  Anesthesia Post-op Note  Patient: Erik Collins  Procedure(s) Performed: Procedure(s) (LRB) with comments: LUMBAR LAMINECTOMY/DECOMPRESSION MICRODISCECTOMY 1 LEVEL (Right) - Right Lumbar five-sacral one microdiscectomy  Patient Location: PACU  Anesthesia Type:General  Level of Consciousness: awake  Airway and Oxygen Therapy: Patient Spontanous Breathing  Post-op Pain: mild  Post-op Assessment: Post-op Vital signs reviewed  Post-op Vital Signs: stable  Complications: No apparent anesthesia complications

## 2011-12-17 NOTE — Discharge Summary (Signed)
Physician Discharge Summary  Patient ID: Erik Collins MRN: [id number] DOB/AGE: [birth year] 66 y.o.  Admit date: 12/17/2011 Discharge date: 12/17/2011  Admission Diagnoses: Herniated disc L5-S1 on the right    Discharge Diagnoses: Same   Discharged Condition: good  Hospital Course: The patient was admitted on 12/17/2011 and taken to the operating room where the patient underwent right L5-S1 microdiscectomy. The patient tolerated the procedure well and was taken to the recovery room and then to the floor in stable condition. The hospital course was routine. There were no complications. The wound remained clean dry and intact. Pt had appropriate back soreness. No complaints of leg pain or new N/T/W. The patient remained afebrile with stable vital signs, and tolerated a regular diet. The patient continued to increase activities, and pain was well controlled with oral pain medications.   Consults: None  Significant Diagnostic Studies:  Results for orders placed during the hospital encounter of 12/17/11  GLUCOSE, CAPILLARY      Component Value Range   Glucose-Capillary 125 (*) 70 - 99 mg/dL    Chest 2 View  21/30/8657  *RADIOLOGY REPORT*  Clinical Data: Preop  CHEST - 2 VIEW  Comparison: None.  Findings: Cardiomediastinal silhouette is unremarkable.  The patient is status post CABG.  There is elevation of the left hemidiaphragm.  There is left basilar atelectasis or scarring. Mild degenerative changes thoracic spine. No acute infiltrate or pulmonary edema.  Atherosclerotic calcifications of the thoracic aorta.  IMPRESSION:  No active disease.  Elevation of the left hemidiaphragm.  Left basilar atelectasis or scarring.  No acute infiltrate or pulmonary edema.  Degenerative changes thoracic spine.   Original Report Authenticated By: Natasha Mead, M.D.    Dg Lumbar Spine 1 View  12/17/2011  *RADIOLOGY REPORT*  Clinical Data: Lumbar microdiskectomy.  LUMBAR SPINE - 1 VIEW  Comparison: None.   Findings: Single lateral view, labeled #1 at 1500 hours, demonstrates a surgical device projecting posterior to the lumbosacral junction.  Maintenance of vertebral body height.  IMPRESSION: Intraoperative localization of the lumbosacral junction.   Original Report Authenticated By: Consuello Bossier, M.D.     Antibiotics:  Anti-infectives     Start     Dose/Rate Route Frequency Ordered Stop   12/17/11 1504   bacitracin 50,000 Units in sodium chloride irrigation 0.9 % 500 mL irrigation  Status:  Discontinued          As needed 12/17/11 1504 12/17/11 1538   12/17/11 1415   ceFAZolin (ANCEF) 2-3 GM-% IVPB SOLR     Comments: MARTINELLI, JOHN: cabinet override         12/17/11 1415 12/17/11 1426   12/17/11 1401   bacitracin 84696 UNITS injection     Comments: DAY, DORY: cabinet override         12/17/11 1401 12/18/11 0214   12/17/11 0600   ceFAZolin (ANCEF) IVPB 2 g/50 mL premix        2 g 100 mL/hr over 30 Minutes Intravenous On call to O.R. 12/16/11 1443 12/18/11 0559          Discharge Exam: Blood pressure 128/78, pulse 67, temperature 97.8 F (36.6 C), temperature source Oral, resp. rate 18, SpO2 100.00%. Neurologic: Grossly normal Incision clean dry and intact  Discharge Medications:     Medication List     As of 12/17/2011  3:57 PM    ASK your doctor about these medications         acetaminophen 325 MG tablet   Commonly known  as: TYLENOL   Take 650 mg by mouth every 6 (six) hours as needed. For pain      aspirin EC 81 MG tablet   Take 81 mg by mouth daily.      atorvastatin 20 MG tablet   Commonly known as: LIPITOR   Take 20 mg by mouth daily.      carvedilol 25 MG tablet   Commonly known as: COREG   Take 25 mg by mouth 2 (two) times daily with a meal.      clonazePAM 1 MG tablet   Commonly known as: KLONOPIN   Take 1 mg by mouth at bedtime as needed. For sleep      cyanocobalamin 100 MCG tablet   Take 100 mcg by mouth daily.      cyclobenzaprine 5 MG  tablet   Commonly known as: FLEXERIL   Take 5 mg by mouth 3 (three) times daily as needed. For muscle spasms      enalapril 5 MG tablet   Commonly known as: VASOTEC   Take 5 mg by mouth daily.      fish oil-omega-3 fatty acids 1000 MG capsule   Take 1 g by mouth 3 (three) times daily.      metFORMIN 500 MG tablet   Commonly known as: GLUCOPHAGE   Take 1,000 mg by mouth 2 (two) times daily with a meal.      mupirocin ointment 2 %   Commonly known as: BACTROBAN   Place into the nose 2 (two) times daily.      omeprazole 20 MG capsule   Commonly known as: PRILOSEC   Take 20 mg by mouth daily.      OVER THE COUNTER MEDICATION   Take 1 tablet by mouth 2 (two) times daily. Glucosamine Triple Strength      traMADol 50 MG tablet   Commonly known as: ULTRAM   Take 50 mg by mouth 2 (two) times daily as needed. For pain        Disposition: Home  Final Dx: Microdiscectomy       Follow-up Information    Follow up with Oryn Casanova S, MD. Schedule an appointment as soon as possible for a visit in 2 weeks.   Contact information:   [address] [place] [address] [phone]           Signed: Tia Alert 12/17/2011, 3:57 PM

## 2011-12-17 NOTE — Anesthesia Procedure Notes (Signed)
Procedure Name: Intubation Date/Time: 12/17/2011 2:24 PM Performed by: Carmela Rima Pre-anesthesia Checklist: Patient identified, Timeout performed, Emergency Drugs available, Suction available and Patient being monitored Patient Re-evaluated:Patient Re-evaluated prior to inductionOxygen Delivery Method: Circle system utilized Preoxygenation: Pre-oxygenation with 100% oxygen Intubation Type: IV induction Ventilation: Mask ventilation without difficulty Laryngoscope Size: Mac and 3 Grade View: Grade I Tube type: Oral Tube size: 7.5 mm Number of attempts: 1 Placement Confirmation: ETT inserted through vocal cords under direct vision,  positive ETCO2 and breath sounds checked- equal and bilateral Secured at: 23 cm Tube secured with: Tape Dental Injury: Teeth and Oropharynx as per pre-operative assessment

## 2011-12-17 NOTE — Progress Notes (Signed)
Lumbar surgery notes given to patient.at discharged for home safety precautions. Pain meds given per patient's request for pain and discomfort of ride home.

## 2011-12-18 ENCOUNTER — Encounter (HOSPITAL_COMMUNITY): Payer: Self-pay | Admitting: Neurological Surgery

## 2014-09-11 ENCOUNTER — Telehealth: Payer: Self-pay

## 2014-09-11 NOTE — Telephone Encounter (Signed)
Error

## 2014-11-08 ENCOUNTER — Encounter: Payer: Self-pay | Admitting: *Deleted

## 2014-11-09 ENCOUNTER — Ambulatory Visit
Admission: RE | Admit: 2014-11-09 | Discharge: 2014-11-09 | Disposition: A | Discharge status: Home / Self Care | Payer: Medicare Other | Source: Ambulatory Visit | Attending: Gastroenterology | Admitting: Gastroenterology

## 2014-11-09 ENCOUNTER — Ambulatory Visit: Payer: Medicare Other | Admitting: Anesthesiology

## 2014-11-09 ENCOUNTER — Encounter
Admission: RE | Disposition: A | Discharge status: Home / Self Care | Payer: Self-pay | Source: Ambulatory Visit | Attending: Gastroenterology

## 2014-11-09 ENCOUNTER — Encounter: Payer: Self-pay | Admitting: *Deleted

## 2014-11-09 DIAGNOSIS — D123 Benign neoplasm of transverse colon: Secondary | ICD-10-CM | POA: Diagnosis not present

## 2014-11-09 DIAGNOSIS — I252 Old myocardial infarction: Secondary | ICD-10-CM | POA: Diagnosis not present

## 2014-11-09 DIAGNOSIS — Z87891 Personal history of nicotine dependence: Secondary | ICD-10-CM | POA: Diagnosis not present

## 2014-11-09 DIAGNOSIS — K2980 Duodenitis without bleeding: Secondary | ICD-10-CM | POA: Diagnosis not present

## 2014-11-09 DIAGNOSIS — K5730 Diverticulosis of large intestine without perforation or abscess without bleeding: Secondary | ICD-10-CM | POA: Insufficient documentation

## 2014-11-09 DIAGNOSIS — K640 First degree hemorrhoids: Secondary | ICD-10-CM | POA: Diagnosis not present

## 2014-11-09 DIAGNOSIS — Z85118 Personal history of other malignant neoplasm of bronchus and lung: Secondary | ICD-10-CM | POA: Diagnosis not present

## 2014-11-09 DIAGNOSIS — Z902 Acquired absence of lung [part of]: Secondary | ICD-10-CM | POA: Diagnosis not present

## 2014-11-09 DIAGNOSIS — D649 Anemia, unspecified: Secondary | ICD-10-CM | POA: Diagnosis not present

## 2014-11-09 DIAGNOSIS — K921 Melena: Secondary | ICD-10-CM | POA: Insufficient documentation

## 2014-11-09 DIAGNOSIS — I10 Essential (primary) hypertension: Secondary | ICD-10-CM | POA: Insufficient documentation

## 2014-11-09 DIAGNOSIS — Z79899 Other long term (current) drug therapy: Secondary | ICD-10-CM | POA: Insufficient documentation

## 2014-11-09 DIAGNOSIS — Z7982 Long term (current) use of aspirin: Secondary | ICD-10-CM | POA: Diagnosis not present

## 2014-11-09 DIAGNOSIS — E119 Type 2 diabetes mellitus without complications: Secondary | ICD-10-CM | POA: Insufficient documentation

## 2014-11-09 DIAGNOSIS — Z951 Presence of aortocoronary bypass graft: Secondary | ICD-10-CM | POA: Diagnosis not present

## 2014-11-09 DIAGNOSIS — D125 Benign neoplasm of sigmoid colon: Secondary | ICD-10-CM | POA: Diagnosis not present

## 2014-11-09 DIAGNOSIS — J449 Chronic obstructive pulmonary disease, unspecified: Secondary | ICD-10-CM | POA: Diagnosis not present

## 2014-11-09 DIAGNOSIS — E780 Pure hypercholesterolemia: Secondary | ICD-10-CM | POA: Insufficient documentation

## 2014-11-09 DIAGNOSIS — K219 Gastro-esophageal reflux disease without esophagitis: Secondary | ICD-10-CM | POA: Insufficient documentation

## 2014-11-09 DIAGNOSIS — M1990 Unspecified osteoarthritis, unspecified site: Secondary | ICD-10-CM | POA: Insufficient documentation

## 2014-11-09 DIAGNOSIS — I2510 Atherosclerotic heart disease of native coronary artery without angina pectoris: Secondary | ICD-10-CM | POA: Diagnosis not present

## 2014-11-09 HISTORY — DX: Heart failure, unspecified: I50.9

## 2014-11-09 HISTORY — DX: Malignant neoplasm of unspecified part of unspecified bronchus or lung: C34.90

## 2014-11-09 HISTORY — PX: ESOPHAGOGASTRODUODENOSCOPY (EGD) WITH PROPOFOL: SHX5813

## 2014-11-09 HISTORY — PX: COLONOSCOPY WITH PROPOFOL: SHX5780

## 2014-11-09 HISTORY — DX: Unspecified osteoarthritis, unspecified site: M19.90

## 2014-11-09 LAB — GLUCOSE, CAPILLARY: Glucose-Capillary: 140 mg/dL — ABNORMAL HIGH (ref 65–99)

## 2014-11-09 SURGERY — ESOPHAGOGASTRODUODENOSCOPY (EGD) WITH PROPOFOL
Anesthesia: General

## 2014-11-09 MED ORDER — FENTANYL CITRATE (PF) 100 MCG/2ML IJ SOLN
INTRAMUSCULAR | Status: DC | PRN
  Administered 2014-11-09: 50 ug via INTRAVENOUS

## 2014-11-09 MED ORDER — MIDAZOLAM HCL 2 MG/2ML IJ SOLN
INTRAMUSCULAR | Status: DC | PRN
  Administered 2014-11-09: 1 mg via INTRAVENOUS

## 2014-11-09 MED ORDER — PHENYLEPHRINE HCL 10 MG/ML IJ SOLN
INTRAMUSCULAR | Status: DC | PRN
  Administered 2014-11-09: 50 ug via INTRAVENOUS

## 2014-11-09 MED ORDER — SODIUM CHLORIDE 0.9 % IV SOLN
INTRAVENOUS | Status: DC
Start: 2014-11-09 — End: 2014-11-09
  Administered 2014-11-09: 09:00:00 via INTRAVENOUS

## 2014-11-09 MED ORDER — PROPOFOL 10 MG/ML IV BOLUS
INTRAVENOUS | Status: DC | PRN
  Administered 2014-11-09: 20 mg via INTRAVENOUS
  Administered 2014-11-09: 10 mg via INTRAVENOUS
  Administered 2014-11-09: 20 mg via INTRAVENOUS

## 2014-11-09 MED ORDER — EPHEDRINE SULFATE 50 MG/ML IJ SOLN
INTRAMUSCULAR | Status: DC | PRN
  Administered 2014-11-09 (×2): 5 mg via INTRAVENOUS

## 2014-11-09 MED ORDER — SODIUM CHLORIDE 0.9 % IV SOLN: INTRAVENOUS | Status: DC

## 2014-11-09 MED ORDER — PROPOFOL INFUSION 10 MG/ML OPTIME
INTRAVENOUS | Status: DC | PRN
  Administered 2014-11-09: 120 ug/kg/min via INTRAVENOUS

## 2014-11-09 NOTE — Op Note (Signed)
[facility] Gastroenterology Patient Name: Erik Collins Procedure Date: 11/09/2014 8:54 AM MRN: [id number] Account #: [id number] Date of Birth: [birth year] Admit Type: Outpatient Age: 69 Room: [facility] ENDO ROOM 3 Gender: Male Note Status: Finalized Procedure:         Upper GI endoscopy Indications:       Anemia, Heme positive stool, Melena Patient Profile:   This is a 69 year old male. Providers:         Gerrit Heck. Rayann Heman, MD Referring MD:      [facility] Medicines:         Propofol per Anesthesia Complications:     No immediate complications. Procedure:         Pre-Anesthesia Assessment:                    - Prior to the procedure, a History and Physical was                     performed, and patient medications, allergies and                     sensitivities were reviewed. The patient's tolerance of                     previous anesthesia was reviewed.                    After obtaining informed consent, the endoscope was passed                     under direct vision. Throughout the procedure, the                     patient's blood pressure, pulse, and oxygen saturations                     were monitored continuously. The Endoscope was introduced                     through the mouth, and advanced to the second part of                     duodenum. The upper GI endoscopy was accomplished without                     difficulty. The patient tolerated the procedure well. Findings:      The esophagus was normal.      The stomach was normal.      Mildly erythematous mucosa was found in the duodenal bulb.      Biopsies were taken with a cold forceps in the duodenal bulb and in the       2nd part of the duodenum for histology. Impression:        - Normal esophagus.                    - Normal stomach.                    - Erythematous duodenopathy.                    - Biopsies were taken with a cold forceps for histology in                     the duodenal  bulb and  2nd part of the duodenum. Recommendation:    - Resume regular diet.                    - Perform a colonoscopy today.                    - Await pathology results.                    - The findings and recommendations were discussed with the                     patient.                    - The findings and recommendations were discussed with the                     patient's family. Procedure Code(s): --- Professional ---                    [phone], Esophagogastroduodenoscopy, flexible, transoral;                     with biopsy, single or multiple CPT copyright 2014 American Medical Association. All rights reserved. The codes documented in this report are preliminary and upon coder review may  be revised to meet current compliance requirements. Mellody Life, MD 11/09/2014 9:12:21 AM This report has been signed electronically. Number of Addenda: 0 Note Initiated On: 11/09/2014 8:54 AM      [facility]

## 2014-11-09 NOTE — Anesthesia Postprocedure Evaluation (Signed)
  Anesthesia Post-op Note  Patient: Erik Collins  Procedure(s) Performed: Procedure(s): ESOPHAGOGASTRODUODENOSCOPY (EGD) WITH PROPOFOL (N/A) COLONOSCOPY WITH PROPOFOL (N/A)  Anesthesia type:General  Patient location: PACU  Post pain: Pain level controlled  Post assessment: Post-op Vital signs reviewed, Patient's Cardiovascular Status Stable, Respiratory Function Stable, Patent Airway and No signs of Nausea or vomiting  Post vital signs: Reviewed and stable  Last Vitals:  Filed Vitals:   11/09/14 0802  BP: 142/75  Pulse: 65  Temp: 36.6 C  Resp: 14    Level of consciousness: awake, alert  and patient cooperative  Complications: No apparent anesthesia complications

## 2014-11-09 NOTE — Op Note (Addendum)
[facility] Gastroenterology Patient Name: Erik Collins Procedure Date: 11/09/2014 9:12 AM MRN: [id number] Account #: [id number] Date of Birth: 1946/01/03 Admit Type: Outpatient Age: 69 Room: [facility] ENDO ROOM 3 Gender: Male Note Status: Finalized Procedure:         Colonoscopy Indications:       Heme positive stool, Melena, Personal history of colonic                     polyps(hyperplastic only) Patient Profile:   This is a 69 year old male. Providers:         Gerrit Heck. Rayann Heman, MD Referring MD:      [facility] Medicines:         Propofol per Anesthesia Complications:     No immediate complications. Procedure:         Pre-Anesthesia Assessment:                    - Prior to the procedure, a History and Physical was                     performed, and patient medications, allergies and                     sensitivities were reviewed. The patient's tolerance of                     previous anesthesia was reviewed.                    After obtaining informed consent, the colonoscope was                     passed under direct vision. Throughout the procedure, the                     patient's blood pressure, pulse, and oxygen saturations                     were monitored continuously. The Olympus CF-H180AL                     colonoscope ( S#: [id number] ) was introduced through the                     anus and advanced to the the terminal ileum. The                     colonoscopy was performed without difficulty. The patient                     tolerated the procedure well. The quality of the bowel                     preparation was good. Findings:      The perianal and digital rectal examinations were normal.      A 6 mm polyp was found in the proximal sigmoid colon. The polyp was       sessile. The polyp was removed with a hot snare. Resection was complete,       but the polyp tissue was not retrieved.      A 3 mm polyp was found in the distal sigmoid colon.  The polyp was       sessile. The polyp was removed with a jumbo cold forceps. Resection  and       retrieval were complete.      A few large-mouthed diverticula were found in the sigmoid colon.      A single large-mouthed diverticulum was found in the ascending colon.      Internal hemorrhoids were found during retroflexion. The hemorrhoids       were Grade I (internal hemorrhoids that do not prolapse).      The exam was otherwise without abnormality. Impression:        ( ? shatttered on suctioning).                    - One 6 mm polyp in the proximal sigmoid colon. Complete                     resection. Polyp tissue not retrieved.( ? shattered upon                     suctioning).                    - One 3 mm polyp in the distal sigmoid colon. Resected and                     retrieved.                    - Diverticulosis in the sigmoid colon.                    - Diverticulosis in the ascending colon.                    - Internal hemorrhoids.                    - The examination was otherwise normal. Recommendation:    - Observe patient in GI recovery unit.                    - Resume regular diet.                    - Continue present medications.                    - Await pathology results.                    - Repeat colonoscopy in 5 years for surveillance.                    - Return to referring physician.                    - Consider capsule endoscopy for further melena or                     worsening anemia.                    - The findings and recommendations were discussed with the                     patient.                    - The findings and recommendations were discussed with the                     patient's family. Procedure  Code(s): --- Professional ---                    [phone], Colonoscopy, flexible; with removal of tumor(s),                     polyp(s), or other lesion(s) by snare technique                    [id number], 59, Colonoscopy, flexible; with  biopsy, single or                     multiple CPT copyright 2014 American Medical Association. All rights reserved. The codes documented in this report are preliminary and upon coder review may  be revised to meet current compliance requirements. Mellody Life, MD 11/09/2014 9:45:14 AM This report has been signed electronically. Number of Addenda: 0 Note Initiated On: 11/09/2014 9:12 AM Scope Withdrawal Time: 0 hours 13 minutes 40 seconds  Total Procedure Duration: 0 hours 19 minutes 28 seconds       [facility]

## 2014-11-09 NOTE — Discharge Instructions (Signed)

## 2014-11-09 NOTE — Anesthesia Preprocedure Evaluation (Signed)
Anesthesia Evaluation  Patient identified by MRN, date of birth, ID band Patient awake    Reviewed: Allergy & Precautions, NPO status , Patient's Chart, lab work & pertinent test results, reviewed documented beta blocker date and time   Airway Mallampati: II  TM Distance: >3 FB Neck ROM: Full    Dental  (+) Upper Dentures   Pulmonary COPD,  COPD inhaler, former smoker,  lobectomy   Pulmonary exam normal breath sounds clear to auscultation       Cardiovascular hypertension, Pt. on medications and Pt. on home beta blockers + CAD, + Past MI and +CHF  Normal cardiovascular exam     Neuro/Psych negative neurological ROS  negative psych ROS   GI/Hepatic Neg liver ROS, GERD  Medicated and Controlled,  Endo/Other  diabetes, Well Controlled, Type 2, Oral Hypoglycemic Agents  Renal/GU negative Renal ROS     Musculoskeletal  (+) Arthritis , Osteoarthritis,    Abdominal Normal abdominal exam  (+)   Peds negative pediatric ROS (+)  Hematology negative hematology ROS (+)   Anesthesia Other Findings   Reproductive/Obstetrics                             Anesthesia Physical Anesthesia Plan  ASA: III  Anesthesia Plan: General   Post-op Pain Management:    Induction: Intravenous  Airway Management Planned: Nasal Cannula  Additional Equipment:   Intra-op Plan:   Post-operative Plan:   Informed Consent: I have reviewed the patients History and Physical, chart, labs and discussed the procedure including the risks, benefits and alternatives for the proposed anesthesia with the patient or authorized representative who has indicated his/her understanding and acceptance.   Dental advisory given  Plan Discussed with: CRNA and Surgeon  Anesthesia Plan Comments:         Anesthesia Quick Evaluation

## 2014-11-09 NOTE — Transfer of Care (Signed)
Immediate Anesthesia Transfer of Care Note  Patient: Erik Collins  Procedure(s) Performed: Procedure(s): ESOPHAGOGASTRODUODENOSCOPY (EGD) WITH PROPOFOL (N/A) COLONOSCOPY WITH PROPOFOL (N/A)  Patient Location: PACU  Anesthesia Type:General  Level of Consciousness: awake, alert  and oriented  Airway & Oxygen Therapy: Patient connected to nasal cannula oxygen  Post-op Assessment: Report given to RN and Post -op Vital signs reviewed and stable  Post vital signs: Reviewed and stable  Last Vitals:  Filed Vitals:   11/09/14 0802  BP: 142/75  Pulse: 65  Temp: 36.6 C  Resp: 14    Complications: No apparent anesthesia complications

## 2014-11-09 NOTE — H&P (Signed)
Primary Care Physician:  Perrin Maltese, MD  Pre-Procedure History & Physical: HPI:  Erik Collins is a 69 y.o. male is here for an colonoscopy / EGD   Past Medical History  Diagnosis Date  . Hypertension   . COPD (chronic obstructive pulmonary disease)   . Diabetes mellitus without complication   . GERD (gastroesophageal reflux disease)   . Cancer     hx lung cancer  . Hypercholesterolemia   . Myocardial infarction     09/2009 f/up with Dr. Neoma Laming  . Coronary artery disease     s/p CABG  . Arthritis   . Lung cancer   . CHF (congestive heart failure)     Past Surgical History  Procedure Laterality Date  . Left lower lobectomy  09/2009    at [facility]  . Coronary artery bypass graft  09/2009    quadruple, done at [facility]  . Lumbar laminectomy/decompression microdiscectomy  12/17/2011    Procedure: LUMBAR LAMINECTOMY/DECOMPRESSION MICRODISCECTOMY 1 LEVEL;  Surgeon: Eustace Moore, MD;  Location: [facility] NEURO ORS;  Service: Neurosurgery;  Laterality: Right;  Right Lumbar five-sacral one microdiscectomy  . Back surgery    . Coronary angioplasty      Prior to Admission medications   Medication Sig Start Date End Date Taking? Authorizing Provider  acetaminophen (TYLENOL) 325 MG tablet Take 650 mg by mouth every 6 (six) hours as needed. For pain   Yes Historical Provider, MD  aspirin EC 81 MG tablet Take 81 mg by mouth daily.   Yes Historical Provider, MD  atorvastatin (LIPITOR) 20 MG tablet Take 20 mg by mouth daily.   Yes Historical Provider, MD  carvedilol (COREG) 25 MG tablet Take 25 mg by mouth 2 (two) times daily with a meal.   Yes Historical Provider, MD  clonazePAM (KLONOPIN) 1 MG tablet Take 1 mg by mouth at bedtime as needed. For sleep   Yes Historical Provider, MD  cyanocobalamin 100 MCG tablet Take 100 mcg by mouth daily.   Yes Historical Provider, MD  cyclobenzaprine (FLEXERIL) 5 MG tablet Take 5 mg by mouth 3 (three) times daily as needed. For muscle spasms   Yes Historical  Provider, MD  enalapril (VASOTEC) 5 MG tablet Take 5 mg by mouth daily.   Yes Historical Provider, MD  fish oil-omega-3 fatty acids 1000 MG capsule Take 1 g by mouth 3 (three) times daily.   Yes Historical Provider, MD  glimepiride (AMARYL) 4 MG tablet Take 4 mg by mouth 2 (two) times daily.   Yes Historical Provider, MD  loratadine (CLARITIN) 10 MG tablet Take 10 mg by mouth daily.   Yes Historical Provider, MD  losartan (COZAAR) 25 MG tablet Take 25 mg by mouth daily.   Yes Historical Provider, MD  magnesium oxide (MAG-OX) 400 MG tablet Take 400 mg by mouth daily.   Yes Historical Provider, MD  metFORMIN (GLUCOPHAGE) 500 MG tablet Take 1,000 mg by mouth 2 (two) times daily with a meal.   Yes Historical Provider, MD  omeprazole (PRILOSEC) 20 MG capsule Take 20 mg by mouth daily.   Yes Historical Provider, MD  tizanidine (ZANAFLEX) 2 MG capsule Take 2 mg by mouth 3 (three) times daily.   Yes Historical Provider, MD  traMADol (ULTRAM) 50 MG tablet Take 50 mg by mouth 2 (two) times daily as needed. For pain   Yes Historical Provider, MD  OVER THE COUNTER MEDICATION Take 1 tablet by mouth 2 (two) times daily. Glucosamine Triple Strength  Historical Provider, MD    Allergies as of 10/17/2014  . (No Known Allergies)    History reviewed. No pertinent family history.  Social History   Social History  . Marital Status: Married    Spouse Name: N/A  . Number of Children: N/A  . Years of Education: N/A   Occupational History  . Not on file.   Social History Main Topics  . Smoking status: Former Smoker    Quit date: 09/19/2009  . Smokeless tobacco: Never Used  . Alcohol Use: No  . Drug Use: No  . Sexual Activity: Not on file   Other Topics Concern  . Not on file   Social History Narrative     Physical Exam: BP 142/75 mmHg  Pulse 65  Temp(Src) 97.8 F (36.6 C) (Oral)  Resp 14  Ht '5\' 11"'$  (1.803 m)  Wt 89.812 kg (198 lb)  BMI 27.63 kg/m2  SpO2 98% General:   Alert,   pleasant and cooperative in NAD Head:  Normocephalic and atraumatic. Neck:  Supple; no masses or thyromegaly. Lungs:  Clear throughout to auscultation.    Heart:  Regular rate and rhythm. Abdomen:  Soft, nontender and nondistended. Normal bowel sounds, without guarding, and without rebound, + obese abd Neurologic:  Alert and  oriented x4;  grossly normal neurologically.  Impression/Plan: Erik Collins is here for an colonoscopy to be performed for melena, + FOBT, anemia, hx polyps,   EGD for melena, anemia, + FOBT  Risks, benefits, limitations, and alternatives regarding  Colonoscopy / EGD  have been reviewed with the patient.  Questions have been answered.  All parties agreeable.   Josefine Class, MD  11/09/2014, 8:51 AM

## 2014-11-10 LAB — SURGICAL PATHOLOGY

## 2015-09-08 ENCOUNTER — Other Ambulatory Visit
Admission: RE | Admit: 2015-09-08 | Discharge: 2015-09-08 | Disposition: A | Discharge status: Home / Self Care | Payer: Medicare Other | Source: Ambulatory Visit | Attending: Ophthalmology | Admitting: Ophthalmology

## 2015-09-08 DIAGNOSIS — H53139 Sudden visual loss, unspecified eye: Secondary | ICD-10-CM | POA: Insufficient documentation

## 2015-09-08 DIAGNOSIS — R51 Headache: Secondary | ICD-10-CM | POA: Diagnosis present

## 2015-09-08 DIAGNOSIS — H47011 Ischemic optic neuropathy, right eye: Secondary | ICD-10-CM | POA: Insufficient documentation

## 2015-09-08 LAB — CBC WITH DIFFERENTIAL/PLATELET
Basophils Absolute: 0 10*3/uL (ref 0–0.1)
Basophils Relative: 0 %
Eosinophils Absolute: 0.1 10*3/uL (ref 0–0.7)
Eosinophils Relative: 2 %
HCT: 32.8 % — ABNORMAL LOW (ref 40.0–52.0)
Hemoglobin: 11.4 g/dL — ABNORMAL LOW (ref 13.0–18.0)
Lymphocytes Relative: 20 %
Lymphs Abs: 1.3 10*3/uL (ref 1.0–3.6)
MCH: 30.9 pg (ref 26.0–34.0)
MCHC: 34.7 g/dL (ref 32.0–36.0)
MCV: 89.1 fL (ref 80.0–100.0)
Monocytes Absolute: 0.6 10*3/uL (ref 0.2–1.0)
Monocytes Relative: 10 %
Neutro Abs: 4.3 10*3/uL (ref 1.4–6.5)
Neutrophils Relative %: 68 %
Platelets: 173 10*3/uL (ref 150–440)
RBC: 3.69 MIL/uL — ABNORMAL LOW (ref 4.40–5.90)
RDW: 13.9 % (ref 11.5–14.5)
WBC: 6.3 10*3/uL (ref 3.8–10.6)

## 2015-09-08 LAB — C-REACTIVE PROTEIN: CRP: 0.6 mg/dL (ref <1.0)

## 2015-09-08 LAB — SEDIMENTATION RATE: Sed Rate: 17 mm/hr (ref 0–20)

## 2015-09-11 ENCOUNTER — Other Ambulatory Visit: Payer: Self-pay | Admitting: Ophthalmology

## 2015-09-11 DIAGNOSIS — H5340 Unspecified visual field defects: Secondary | ICD-10-CM

## 2015-09-11 DIAGNOSIS — H4710 Unspecified papilledema: Secondary | ICD-10-CM

## 2015-09-24 ENCOUNTER — Ambulatory Visit
Admission: RE | Admit: 2015-09-24 | Discharge: 2015-09-24 | Disposition: A | Discharge status: Home / Self Care | Payer: Medicare Other | Source: Ambulatory Visit | Attending: Ophthalmology | Admitting: Ophthalmology

## 2015-09-24 DIAGNOSIS — I6521 Occlusion and stenosis of right carotid artery: Secondary | ICD-10-CM | POA: Diagnosis not present

## 2015-09-24 DIAGNOSIS — I639 Cerebral infarction, unspecified: Secondary | ICD-10-CM | POA: Diagnosis not present

## 2015-09-24 DIAGNOSIS — H47011 Ischemic optic neuropathy, right eye: Secondary | ICD-10-CM | POA: Diagnosis present

## 2015-09-24 DIAGNOSIS — H4710 Unspecified papilledema: Secondary | ICD-10-CM

## 2015-09-24 DIAGNOSIS — H5340 Unspecified visual field defects: Secondary | ICD-10-CM | POA: Diagnosis present

## 2015-09-24 DIAGNOSIS — J320 Chronic maxillary sinusitis: Secondary | ICD-10-CM | POA: Insufficient documentation

## 2016-10-01 ENCOUNTER — Other Ambulatory Visit (INDEPENDENT_AMBULATORY_CARE_PROVIDER_SITE_OTHER): Payer: Self-pay | Admitting: Vascular Surgery

## 2016-10-01 DIAGNOSIS — I739 Peripheral vascular disease, unspecified: Principal | ICD-10-CM

## 2016-10-01 DIAGNOSIS — I779 Disorder of arteries and arterioles, unspecified: Secondary | ICD-10-CM

## 2016-10-02 ENCOUNTER — Ambulatory Visit (INDEPENDENT_AMBULATORY_CARE_PROVIDER_SITE_OTHER): Payer: Medicare Other | Admitting: Vascular Surgery

## 2016-10-02 ENCOUNTER — Ambulatory Visit (INDEPENDENT_AMBULATORY_CARE_PROVIDER_SITE_OTHER): Payer: Medicare Other

## 2016-10-02 ENCOUNTER — Encounter (INDEPENDENT_AMBULATORY_CARE_PROVIDER_SITE_OTHER): Payer: Self-pay | Admitting: Vascular Surgery

## 2016-10-02 DIAGNOSIS — I6523 Occlusion and stenosis of bilateral carotid arteries: Secondary | ICD-10-CM

## 2016-10-02 DIAGNOSIS — I739 Peripheral vascular disease, unspecified: Principal | ICD-10-CM

## 2016-10-02 DIAGNOSIS — I10 Essential (primary) hypertension: Secondary | ICD-10-CM

## 2016-10-02 DIAGNOSIS — E782 Mixed hyperlipidemia: Secondary | ICD-10-CM

## 2016-10-02 DIAGNOSIS — I779 Disorder of arteries and arterioles, unspecified: Secondary | ICD-10-CM | POA: Diagnosis not present

## 2016-10-02 DIAGNOSIS — E1151 Type 2 diabetes mellitus with diabetic peripheral angiopathy without gangrene: Secondary | ICD-10-CM | POA: Diagnosis not present

## 2016-10-05 DIAGNOSIS — E1122 Type 2 diabetes mellitus with diabetic chronic kidney disease: Secondary | ICD-10-CM | POA: Insufficient documentation

## 2016-10-05 DIAGNOSIS — I6529 Occlusion and stenosis of unspecified carotid artery: Secondary | ICD-10-CM | POA: Insufficient documentation

## 2016-10-05 DIAGNOSIS — E119 Type 2 diabetes mellitus without complications: Secondary | ICD-10-CM | POA: Insufficient documentation

## 2016-10-05 DIAGNOSIS — E785 Hyperlipidemia, unspecified: Secondary | ICD-10-CM | POA: Insufficient documentation

## 2016-10-05 DIAGNOSIS — I10 Essential (primary) hypertension: Secondary | ICD-10-CM | POA: Insufficient documentation

## 2016-10-05 DIAGNOSIS — I152 Hypertension secondary to endocrine disorders: Secondary | ICD-10-CM | POA: Insufficient documentation

## 2016-10-05 NOTE — Progress Notes (Signed)
MRN : [id number]  Erik Collins is a 71 y.o. (Apr 25, 1945) male who presents with chief complaint of  Chief Complaint  Patient presents with  . ultrasound follow up  .  History of Present Illness:The patient is seen for follow up evaluation of carotid stenosis. The carotid stenosis followed by ultrasound.   The patient denies amaurosis fugax. There is no recent history of TIA symptoms or focal motor deficits. There is no prior documented CVA.  The patient is taking enteric-coated aspirin 81 mg daily.  There is no history of migraine headaches. There is no history of seizures.  The patient has a history of coronary artery disease, no recent episodes of angina or shortness of breath. The patient denies PAD or claudication symptoms. There is a history of hyperlipidemia which is being treated with a statin.    Carotid Duplex done today shows known occlusion of the RICA and 94-17% LICA.    Current Meds  Medication Sig  . acetaminophen (TYLENOL) 325 MG tablet Take 650 mg by mouth every 6 (six) hours as needed. For pain  . aspirin EC 81 MG tablet Take 81 mg by mouth daily.  . carvedilol (COREG) 25 MG tablet Take 25 mg by mouth 2 (two) times daily with a meal.  . clonazePAM (KLONOPIN) 1 MG tablet Take 1 mg by mouth at bedtime as needed. For sleep  . fenofibrate 160 MG tablet   . LANTUS SOLOSTAR 100 UNIT/ML Solostar Pen   . metFORMIN (GLUCOPHAGE) 500 MG tablet Take 1,000 mg by mouth 2 (two) times daily with a meal.  . omeprazole (PRILOSEC) 20 MG capsule Take 20 mg by mouth daily.  [name] OVER THE COUNTER MEDICATION Take 1 tablet by mouth 2 (two) times daily. Glucosamine Triple Strength  . tizanidine (ZANAFLEX) 2 MG capsule Take 2 mg by mouth 3 (three) times daily.  . traMADol (ULTRAM) 50 MG tablet Take 50 mg by mouth 2 (two) times daily as needed. For pain    Past Medical History:  Diagnosis Date  . Arthritis   . Cancer ([place])    hx lung cancer  . CHF (congestive heart failure) ([place])     . COPD (chronic obstructive pulmonary disease) ([place])   . Coronary artery disease    s/p CABG  . Diabetes mellitus without complication ([place])   . GERD (gastroesophageal reflux disease)   . Hypercholesterolemia   . Hypertension   . Lung cancer ([place])   . Myocardial infarction [facility])    09/2009 f/up with Dr. Neoma Laming    Past Surgical History:  Procedure Laterality Date  . BACK SURGERY    . COLONOSCOPY WITH PROPOFOL N/A 11/09/2014   Procedure: COLONOSCOPY WITH PROPOFOL;  Surgeon: Josefine Class, MD;  Location: [facility] ENDOSCOPY;  Service: Endoscopy;  Laterality: N/A;  . CORONARY ANGIOPLASTY    . CORONARY ARTERY BYPASS GRAFT  09/2009   quadruple, done at [facility]  . ESOPHAGOGASTRODUODENOSCOPY (EGD) WITH PROPOFOL N/A 11/09/2014   Procedure: ESOPHAGOGASTRODUODENOSCOPY (EGD) WITH PROPOFOL;  Surgeon: Josefine Class, MD;  Location: [facility] ENDOSCOPY;  Service: Endoscopy;  Laterality: N/A;  . left lower lobectomy  09/2009   at [facility]  . LUMBAR LAMINECTOMY/DECOMPRESSION MICRODISCECTOMY  12/17/2011   Procedure: LUMBAR LAMINECTOMY/DECOMPRESSION MICRODISCECTOMY 1 LEVEL;  Surgeon: Eustace Moore, MD;  Location: [place] NEURO ORS;  Service: Neurosurgery;  Laterality: Right;  Right Lumbar five-sacral one microdiscectomy    Social History Social History  Substance Use Topics  . Smoking status: Former Smoker  Quit date: 09/19/2009  . Smokeless tobacco: Never Used  . Alcohol use No    Family History Family History  Problem Relation Age of Onset  . Heart disease Mother   . Diabetes Mother   . Heart disease Father   . Stroke Father   . Heart attack Father     No Known Allergies   REVIEW OF SYSTEMS (Negative unless checked)  Constitutional: [] Weight loss  [] Fever  [] Chills Cardiac: [] Chest pain   [] Chest pressure   [] Palpitations   [] Shortness of breath when laying flat   [] Shortness of breath with exertion. Vascular:  [] Pain in legs with walking   [] Pain in legs at rest  [] History of DVT    [] Phlebitis   [] Swelling in legs   [] Varicose veins   [] Non-healing ulcers Pulmonary:   [] Uses home oxygen   [] Productive cough   [] Hemoptysis   [] Wheeze  [] COPD   [] Asthma Neurologic:  [] Dizziness   [] Seizures   [] History of stroke   [] History of TIA  [] Aphasia   [] Vissual changes   [] Weakness or numbness in arm   [] Weakness or numbness in leg Musculoskeletal:   [] Joint swelling   [] Joint pain   [] Low back pain Hematologic:  [] Easy bruising  [] Easy bleeding   [] Hypercoagulable state   [] Anemic Gastrointestinal:  [] Diarrhea   [] Vomiting  [] Gastroesophageal reflux/heartburn   [] Difficulty swallowing. Genitourinary:  [] Chronic kidney disease   [] Difficult urination  [] Frequent urination   [] Blood in urine Skin:  [] Rashes   [] Ulcers  Psychological:  [] History of anxiety   []  History of major depression.  Physical Examination  Vitals:   10/02/16 1619  BP: 128/66  Pulse: 66  Resp: 16  Weight: 88.5 kg (195 lb)   Body mass index is 27.2 kg/m. Gen: WD/WN, NAD Head: [place]/AT, No temporalis wasting.  Ear/Nose/Throat: Hearing grossly intact, nares w/o erythema or drainage Eyes: PER, EOMI, sclera nonicteric.  Neck: Supple, no large masses.   Pulmonary:  Good air movement, no audible wheezing bilaterally, no use of accessory muscles.  Cardiac: RRR, no JVD Vascular:  Bilateral carotid bruit Vessel Right Left  Radial Palpable Palpable  Ulnar Palpable Palpable  Brachial Palpable Palpable  Carotid Palpable Palpable  Gastrointestinal: Non-distended. No guarding/no peritoneal signs.  Musculoskeletal: M/S 5/5 throughout.  No deformity or atrophy.  Neurologic: CN 2-12 intact. Symmetrical.  Speech is fluent. Motor exam as listed above. Psychiatric: Judgment intact, Mood & affect appropriate for pt's clinical situation. Dermatologic: No rashes or ulcers noted.  No changes consistent with cellulitis. Lymph : No lichenification or skin changes of chronic lymphedema.  CBC Lab Results  Component Value  Date   WBC 6.3 09/08/2015   HGB 11.4 (L) 09/08/2015   HCT 32.8 (L) 09/08/2015   MCV 89.1 09/08/2015   PLT 173 09/08/2015    BMET    Component Value Date/Time   NA 134 (L) 12/16/2011 0835   NA 137 01/03/2010 1019   K 4.4 12/16/2011 0835   K 4.7 01/03/2010 1019   CL 99 12/16/2011 0835   CL 103 01/03/2010 1019   CO2 26 12/16/2011 0835   CO2 27 01/03/2010 1019   GLUCOSE 233 (H) 12/16/2011 0835   GLUCOSE 263 (H) 01/03/2010 1019   BUN 18 12/16/2011 0835   BUN 18 01/03/2010 1019   CREATININE 0.94 12/16/2011 0835   CREATININE 0.9 01/03/2010 1019   CALCIUM 9.3 12/16/2011 0835   CALCIUM 8.9 01/03/2010 1019   GFRNONAA 85 (L) 12/16/2011 0835   GFRAA >90 12/16/2011 6045  CrCl cannot be calculated (Patient's most recent lab result is older than the maximum 21 days allowed.).  COAG Lab Results  Component Value Date   INR 0.97 12/16/2011    Radiology No results found.  Assessment/Plan 1. Bilateral carotid artery stenosis Recommend:  Given the patient's asymptomatic subcritical stenosis no further invasive testing or surgery at this time.  Continue antiplatelet therapy as prescribed Continue management of CAD, HTN and Hyperlipidemia Healthy heart diet,  encouraged exercise at least 4 times per week Follow up in 2 years with duplex ultrasound and physical exam based on <50% stenosis of the LICA carotid artery   - VAS US CAROTID; Future  2. Essential hypertension Continue antihypertensive medications as already ordered, these medications have been reviewed and there are no changes at this time.   3. Mixed hyperlipidemia Continue statin as ordered and reviewed, no changes at this time   4. Type 2 diabetes mellitus with diabetic peripheral angiopathy without gangrene, without long-term current use of insulin (HCC) Continue hypoglycemic medications as already ordered, these medications have been reviewed and there are no changes at this time.  Hgb A1C to be monitored as  already arranged by primary service   Hortencia Pilar, MD  10/05/2016 2:20 PM

## 2017-06-17 ENCOUNTER — Other Ambulatory Visit: Payer: Self-pay | Admitting: Specialist

## 2017-06-25 ENCOUNTER — Encounter
Admission: RE | Admit: 2017-06-25 | Discharge: 2017-06-25 | Disposition: A | Discharge status: Home / Self Care | Payer: Medicare Other | Source: Ambulatory Visit | Attending: Specialist | Admitting: Specialist

## 2017-06-25 ENCOUNTER — Other Ambulatory Visit: Payer: Self-pay

## 2017-06-25 DIAGNOSIS — Z01812 Encounter for preprocedural laboratory examination: Secondary | ICD-10-CM | POA: Insufficient documentation

## 2017-06-25 HISTORY — DX: Sleep apnea, unspecified: G47.30

## 2017-06-25 LAB — CBC WITH DIFFERENTIAL/PLATELET
Basophils Absolute: 0 10*3/uL (ref 0–0.1)
Basophils Relative: 0 %
Eosinophils Absolute: 0.1 10*3/uL (ref 0–0.7)
Eosinophils Relative: 1 %
HCT: 40 % (ref 40.0–52.0)
Hemoglobin: 13.6 g/dL (ref 13.0–18.0)
Lymphocytes Relative: 18 %
Lymphs Abs: 1.3 10*3/uL (ref 1.0–3.6)
MCH: 30.5 pg (ref 26.0–34.0)
MCHC: 34.1 g/dL (ref 32.0–36.0)
MCV: 89.4 fL (ref 80.0–100.0)
Monocytes Absolute: 0.7 10*3/uL (ref 0.2–1.0)
Monocytes Relative: 10 %
Neutro Abs: 5 10*3/uL (ref 1.4–6.5)
Neutrophils Relative %: 71 %
Platelets: 225 10*3/uL (ref 150–440)
RBC: 4.47 MIL/uL (ref 4.40–5.90)
RDW: 14.6 % — ABNORMAL HIGH (ref 11.5–14.5)
WBC: 7 10*3/uL (ref 3.8–10.6)

## 2017-06-25 LAB — URINALYSIS, ROUTINE W REFLEX MICROSCOPIC
Bacteria, UA: NONE SEEN
Bilirubin Urine: NEGATIVE
Glucose, UA: >=500 mg/dL — AB
Hgb urine dipstick: NEGATIVE
Ketones, ur: NEGATIVE mg/dL
Leukocytes, UA: NEGATIVE
Nitrite: NEGATIVE
Protein, ur: NEGATIVE mg/dL
Specific Gravity, Urine: 1.022 (ref 1.005–1.030)
pH: 5 (ref 5.0–8.0)

## 2017-06-25 LAB — COMPREHENSIVE METABOLIC PANEL
ALT: 16 U/L — ABNORMAL LOW (ref 17–63)
AST: 21 U/L (ref 15–41)
Albumin: 4.4 g/dL (ref 3.5–5.0)
Alkaline Phosphatase: 50 U/L (ref 38–126)
Anion gap: 8 (ref 5–15)
BUN: 34 mg/dL — ABNORMAL HIGH (ref 6–20)
CO2: 25 mmol/L (ref 22–32)
Calcium: 9.7 mg/dL (ref 8.9–10.3)
Chloride: 106 mmol/L (ref 101–111)
Creatinine, Ser: 1.16 mg/dL (ref 0.61–1.24)
GFR calc Af Amer: >60 mL/min (ref >60)
GFR calc non Af Amer: >60 mL/min (ref >60)
Glucose, Bld: 113 mg/dL — ABNORMAL HIGH (ref 65–99)
Potassium: 4.2 mmol/L (ref 3.5–5.1)
Sodium: 139 mmol/L (ref 135–145)
Total Bilirubin: 0.5 mg/dL (ref 0.3–1.2)
Total Protein: 8 g/dL (ref 6.5–8.1)

## 2017-06-25 LAB — PROTIME-INR
INR: 0.92
Prothrombin Time: 12.3 seconds (ref 11.4–15.2)

## 2017-06-25 LAB — TYPE AND SCREEN
ABO/RH(D): AB POS
Antibody Screen: NEGATIVE

## 2017-06-25 LAB — SURGICAL PCR SCREEN
MRSA, PCR: NEGATIVE
Staphylococcus aureus: NEGATIVE

## 2017-06-25 LAB — HEMOGLOBIN A1C
Hgb A1c MFr Bld: 6.9 % — ABNORMAL HIGH (ref 4.8–5.6)
Mean Plasma Glucose: 151.33 mg/dL

## 2017-06-25 NOTE — Patient Instructions (Signed)
Your procedure is scheduled on: Wednesday 07/08/17 Report to [place]. To find out your arrival time please call [phone] between 1PM - 3PM on Tuesday 07/07/17.  Remember: Instructions that are not followed completely may result in serious medical risk, up to and including death, or upon the discretion of your surgeon and anesthesiologist your surgery may need to be rescheduled.     _X__ 1. Do not eat food after midnight the night before your procedure.                 No gum chewing or hard candies. You may drink clear liquids up to 2 hours                 before you are scheduled to arrive for your surgery- DO not drink clear                 liquids within 2 hours of the start of your surgery.                 Clear Liquids include:  water, apple juice without pulp, clear carbohydrate                 drink such as Clearfast or Gatorade, Black Coffee or Tea (Do not add                 anything to coffee or tea).  __X__2.  On the morning of surgery brush your teeth with toothpaste and water, you                 may rinse your mouth with mouthwash if you wish.  Do not swallow any              toothpaste of mouthwash.     _X__ 3.  No Alcohol for 24 hours before or after surgery.   _X__ 4.  Do Not Smoke or use e-cigarettes For 24 Hours Prior to Your Surgery.                 Do not use any chewable tobacco products for at least 6 hours prior to                 surgery.  ____  5.  Bring all medications with you on the day of surgery if instructed.   __X__  6.  Notify your doctor if there is any change in your medical condition      (cold, fever, infections).     Do not wear jewelry, make-up, hairpins, clips or nail polish. Do not wear lotions, powders, or perfumes.  Do not shave 48 hours prior to surgery. Men may shave face and neck. Do not bring valuables to the hospital.    [facility] is not responsible for any belongings or  valuables.  Contacts, dentures/partials or body piercings may not be worn into surgery. Bring a case for your contacts, glasses or hearing aids, a denture cup will be supplied. Leave your suitcase in the car. After surgery it may be brought to your room. For patients admitted to the hospital, discharge time is determined by your treatment team.   Patients discharged the day of surgery will not be allowed to drive home.   Please read over the following fact sheets that you were given:   MRSA Information  __X__ Take these medicines the morning of surgery with A SIP OF WATER:  1. CARVEDILOL  2. EZETIMIBE  3. OMEPRAZOLE  4.  5.  6.  ____ Fleet Enema (as directed)   __X__ Use CHG Soap/SAGE wipes as directed  ____ Use inhalers on the day of surgery  __X__ Stop metformin/Janumet/Farxiga 2 days prior to surgery    __X__ Take 1/2 of usual insulin dose the night before surgery. No insulin the morning          of surgery.   __X__ Stop Blood Thinners Coumadin/Plavix/Xarelto/Pleta/Pradaxa/Eliquis/Effient/Aspirin  on 1 WEEK PRIOR TO SURGERY  Or contact your Surgeon, Cardiologist or Medical Doctor regarding  ability to stop your blood thinners  __X__ Stop Anti-inflammatories 7 days before surgery such as Advil, Ibuprofen, Motrin,  BC or Goodies Powder, Naprosyn, Naproxen, Aleve, Aspirin    __X__ Stop all herbal supplements, fish oil or vitamin E until after surgery.    ____ Bring C-Pap to the hospital.

## 2017-07-07 MED ORDER — VANCOMYCIN HCL IN DEXTROSE 1-5 GM/200ML-% IV SOLN
1000.0000 mg | INTRAVENOUS | Status: AC
  Administered 2017-07-08: 1000 mg via INTRAVENOUS

## 2017-07-08 ENCOUNTER — Encounter: Payer: Self-pay | Admitting: *Deleted

## 2017-07-08 ENCOUNTER — Inpatient Hospital Stay: Payer: Medicare Other

## 2017-07-08 ENCOUNTER — Inpatient Hospital Stay: Payer: Medicare Other | Admitting: Certified Registered Nurse Anesthetist

## 2017-07-08 ENCOUNTER — Other Ambulatory Visit: Payer: Self-pay

## 2017-07-08 ENCOUNTER — Inpatient Hospital Stay
Admission: RE | Admit: 2017-07-08 | Discharge: 2017-07-12 | DRG: 470 | Disposition: A | Discharge status: Home Health | Payer: Medicare Other | Attending: Internal Medicine | Admitting: Internal Medicine

## 2017-07-08 ENCOUNTER — Encounter
Admission: RE | Disposition: A | Discharge status: Home Health | Payer: Self-pay | Source: Home / Self Care | Attending: Specialist

## 2017-07-08 DIAGNOSIS — M25762 Osteophyte, left knee: Secondary | ICD-10-CM | POA: Diagnosis present

## 2017-07-08 DIAGNOSIS — Z902 Acquired absence of lung [part of]: Secondary | ICD-10-CM

## 2017-07-08 DIAGNOSIS — Z7952 Long term (current) use of systemic steroids: Secondary | ICD-10-CM

## 2017-07-08 DIAGNOSIS — Z79891 Long term (current) use of opiate analgesic: Secondary | ICD-10-CM

## 2017-07-08 DIAGNOSIS — J449 Chronic obstructive pulmonary disease, unspecified: Secondary | ICD-10-CM | POA: Diagnosis present

## 2017-07-08 DIAGNOSIS — Z96659 Presence of unspecified artificial knee joint: Secondary | ICD-10-CM

## 2017-07-08 DIAGNOSIS — Z79899 Other long term (current) drug therapy: Secondary | ICD-10-CM

## 2017-07-08 DIAGNOSIS — E1151 Type 2 diabetes mellitus with diabetic peripheral angiopathy without gangrene: Secondary | ICD-10-CM | POA: Diagnosis present

## 2017-07-08 DIAGNOSIS — Z7982 Long term (current) use of aspirin: Secondary | ICD-10-CM

## 2017-07-08 DIAGNOSIS — R001 Bradycardia, unspecified: Secondary | ICD-10-CM | POA: Diagnosis not present

## 2017-07-08 DIAGNOSIS — G4730 Sleep apnea, unspecified: Secondary | ICD-10-CM | POA: Diagnosis present

## 2017-07-08 DIAGNOSIS — Z85118 Personal history of other malignant neoplasm of bronchus and lung: Secondary | ICD-10-CM

## 2017-07-08 DIAGNOSIS — M1712 Unilateral primary osteoarthritis, left knee: Secondary | ICD-10-CM | POA: Diagnosis present

## 2017-07-08 DIAGNOSIS — E785 Hyperlipidemia, unspecified: Secondary | ICD-10-CM | POA: Diagnosis present

## 2017-07-08 DIAGNOSIS — I2510 Atherosclerotic heart disease of native coronary artery without angina pectoris: Secondary | ICD-10-CM | POA: Diagnosis present

## 2017-07-08 DIAGNOSIS — Z951 Presence of aortocoronary bypass graft: Secondary | ICD-10-CM | POA: Diagnosis not present

## 2017-07-08 DIAGNOSIS — I110 Hypertensive heart disease with heart failure: Secondary | ICD-10-CM | POA: Diagnosis present

## 2017-07-08 DIAGNOSIS — Z833 Family history of diabetes mellitus: Secondary | ICD-10-CM

## 2017-07-08 DIAGNOSIS — Z8261 Family history of arthritis: Secondary | ICD-10-CM | POA: Diagnosis not present

## 2017-07-08 DIAGNOSIS — K219 Gastro-esophageal reflux disease without esophagitis: Secondary | ICD-10-CM | POA: Diagnosis present

## 2017-07-08 DIAGNOSIS — Z794 Long term (current) use of insulin: Secondary | ICD-10-CM | POA: Diagnosis not present

## 2017-07-08 DIAGNOSIS — Z87891 Personal history of nicotine dependence: Secondary | ICD-10-CM | POA: Diagnosis not present

## 2017-07-08 DIAGNOSIS — Z8249 Family history of ischemic heart disease and other diseases of the circulatory system: Secondary | ICD-10-CM | POA: Diagnosis not present

## 2017-07-08 DIAGNOSIS — R509 Fever, unspecified: Secondary | ICD-10-CM

## 2017-07-08 DIAGNOSIS — I252 Old myocardial infarction: Secondary | ICD-10-CM | POA: Diagnosis not present

## 2017-07-08 DIAGNOSIS — I5022 Chronic systolic (congestive) heart failure: Secondary | ICD-10-CM | POA: Diagnosis present

## 2017-07-08 HISTORY — PX: TOTAL KNEE ARTHROPLASTY: SHX125

## 2017-07-08 LAB — CREATININE, SERUM
Creatinine, Ser: 0.93 mg/dL (ref 0.61–1.24)
GFR calc Af Amer: >60 mL/min (ref >60)
GFR calc non Af Amer: >60 mL/min (ref >60)

## 2017-07-08 LAB — CBC
HCT: 34.3 % — ABNORMAL LOW (ref 40.0–52.0)
Hemoglobin: 11.5 g/dL — ABNORMAL LOW (ref 13.0–18.0)
MCH: 30.2 pg (ref 26.0–34.0)
MCHC: 33.5 g/dL (ref 32.0–36.0)
MCV: 90.3 fL (ref 80.0–100.0)
Platelets: 182 10*3/uL (ref 150–440)
RBC: 3.8 MIL/uL — ABNORMAL LOW (ref 4.40–5.90)
RDW: 14.5 % (ref 11.5–14.5)
WBC: 7.8 10*3/uL (ref 3.8–10.6)

## 2017-07-08 LAB — GLUCOSE, CAPILLARY
Glucose-Capillary: 129 mg/dL — ABNORMAL HIGH (ref 65–99)
Glucose-Capillary: 98 mg/dL (ref 65–99)
Glucose-Capillary: 96 mg/dL (ref 65–99)
Glucose-Capillary: 100 mg/dL — ABNORMAL HIGH (ref 65–99)

## 2017-07-08 LAB — ABO/RH: ABO/RH(D): AB POS

## 2017-07-08 SURGERY — ARTHROPLASTY, KNEE, TOTAL
Anesthesia: Spinal | Site: Knee | Laterality: Left | Wound class: Clean

## 2017-07-08 MED ORDER — MELOXICAM 7.5 MG PO TABS
15.0000 mg | ORAL_TABLET | Freq: Once | ORAL | Status: AC
  Administered 2017-07-08: 15 mg via ORAL
  Filled 2017-07-08: qty 2

## 2017-07-08 MED ORDER — SODIUM CHLORIDE FLUSH 0.9 % IV SOLN
INTRAVENOUS | Status: AC
  Filled 2017-07-08: qty 40

## 2017-07-08 MED ORDER — BUPIVACAINE HCL (PF) 0.5 % IJ SOLN
INTRAMUSCULAR | Status: DC | PRN
  Administered 2017-07-08: 3 mL

## 2017-07-08 MED ORDER — CHLORHEXIDINE GLUCONATE CLOTH 2 % EX PADS: 6.0000 | MEDICATED_PAD | Freq: Once | CUTANEOUS | Status: DC

## 2017-07-08 MED ORDER — ACETAMINOPHEN 10 MG/ML IV SOLN
INTRAVENOUS | Status: DC | PRN
  Administered 2017-07-08: 1000 mg via INTRAVENOUS

## 2017-07-08 MED ORDER — SODIUM CHLORIDE 0.9 % IV SOLN
INTRAVENOUS | Status: DC
  Administered 2017-07-08 (×2): via INTRAVENOUS

## 2017-07-08 MED ORDER — PROPOFOL 10 MG/ML IV BOLUS
INTRAVENOUS | Status: AC
  Filled 2017-07-08: qty 20

## 2017-07-08 MED ORDER — ONDANSETRON HCL 4 MG/2ML IJ SOLN: 4.0000 mg | Freq: Once | INTRAMUSCULAR | Status: DC | PRN

## 2017-07-08 MED ORDER — HYDROCODONE-ACETAMINOPHEN 7.5-325 MG PO TABS
1.0000 | ORAL_TABLET | ORAL | Status: DC | PRN
  Administered 2017-07-08 – 2017-07-09 (×4): 1 via ORAL
  Filled 2017-07-08 (×4): qty 1

## 2017-07-08 MED ORDER — METOCLOPRAMIDE HCL 10 MG PO TABS
5.0000 mg | ORAL_TABLET | Freq: Three times a day (TID) | ORAL | Status: DC | PRN
  Filled 2017-07-08: qty 1

## 2017-07-08 MED ORDER — MENTHOL 3 MG MT LOZG
1.0000 | LOZENGE | OROMUCOSAL | Status: DC | PRN
  Filled 2017-07-08: qty 9

## 2017-07-08 MED ORDER — FENTANYL CITRATE (PF) 100 MCG/2ML IJ SOLN: 25.0000 ug | INTRAMUSCULAR | Status: DC | PRN

## 2017-07-08 MED ORDER — ENOXAPARIN SODIUM 30 MG/0.3ML ~~LOC~~ SOLN
30.0000 mg | SUBCUTANEOUS | Status: DC
  Administered 2017-07-09: 30 mg via SUBCUTANEOUS
  Filled 2017-07-08: qty 0.3

## 2017-07-08 MED ORDER — SODIUM CHLORIDE 0.9 % IV SOLN
INTRAVENOUS | Status: DC | PRN
  Administered 2017-07-08: 60 mL

## 2017-07-08 MED ORDER — VANCOMYCIN HCL IN DEXTROSE 1-5 GM/200ML-% IV SOLN
1000.0000 mg | Freq: Two times a day (BID) | INTRAVENOUS | Status: AC
  Administered 2017-07-08 – 2017-07-09 (×3): 1000 mg via INTRAVENOUS
  Filled 2017-07-08 (×3): qty 200

## 2017-07-08 MED ORDER — CANAGLIFLOZIN 100 MG PO TABS
100.0000 mg | ORAL_TABLET | Freq: Every day | ORAL | Status: DC
  Filled 2017-07-08 (×2): qty 1

## 2017-07-08 MED ORDER — PROPOFOL 500 MG/50ML IV EMUL
INTRAVENOUS | Status: DC | PRN
  Administered 2017-07-08: 60 ug/kg/min via INTRAVENOUS

## 2017-07-08 MED ORDER — CELECOXIB 200 MG PO CAPS
ORAL_CAPSULE | ORAL | Status: AC
  Filled 2017-07-08: qty 1

## 2017-07-08 MED ORDER — PHENOL 1.4 % MT LIQD: 1.0000 | OROMUCOSAL | Status: DC | PRN

## 2017-07-08 MED ORDER — TRANEXAMIC ACID 1000 MG/10ML IV SOLN
INTRAVENOUS | Status: AC
  Filled 2017-07-08: qty 10

## 2017-07-08 MED ORDER — EPHEDRINE SULFATE 50 MG/ML IJ SOLN
INTRAMUSCULAR | Status: DC | PRN
  Administered 2017-07-08: 5 mg via INTRAVENOUS
  Administered 2017-07-08: 10 mg via INTRAVENOUS
  Administered 2017-07-08 (×3): 5 mg via INTRAVENOUS

## 2017-07-08 MED ORDER — PNEUMOCOCCAL VAC POLYVALENT 25 MCG/0.5ML IJ INJ
0.5000 mL | INJECTION | INTRAMUSCULAR | Status: DC
Start: 2017-07-09 — End: 2017-07-12

## 2017-07-08 MED ORDER — MORPHINE SULFATE (PF) 4 MG/ML IV SOLN
INTRAVENOUS | Status: AC
Start: 2017-07-08 — End: ?
  Filled 2017-07-08: qty 1

## 2017-07-08 MED ORDER — GABAPENTIN 300 MG PO CAPS
300.0000 mg | ORAL_CAPSULE | ORAL | Status: AC
  Administered 2017-07-08: 300 mg via ORAL

## 2017-07-08 MED ORDER — FLEET ENEMA 7-19 GM/118ML RE ENEM: 1.0000 | ENEMA | Freq: Once | RECTAL | Status: DC | PRN

## 2017-07-08 MED ORDER — CLONAZEPAM 1 MG PO TABS
1.0000 mg | ORAL_TABLET | Freq: Every day | ORAL | Status: DC
  Administered 2017-07-08 – 2017-07-11 (×4): 1 mg via ORAL
  Filled 2017-07-08 (×4): qty 1

## 2017-07-08 MED ORDER — ONDANSETRON HCL 4 MG/2ML IJ SOLN
INTRAMUSCULAR | Status: DC | PRN
  Administered 2017-07-08: 4 mg via INTRAVENOUS

## 2017-07-08 MED ORDER — KETOROLAC TROMETHAMINE 30 MG/ML IJ SOLN
INTRAMUSCULAR | Status: DC | PRN
  Administered 2017-07-08: 30 mg via INTRAVENOUS

## 2017-07-08 MED ORDER — ALUM & MAG HYDROXIDE-SIMETH 200-200-20 MG/5ML PO SUSP
30.0000 mL | ORAL | Status: DC | PRN
  Administered 2017-07-10: 30 mL via ORAL
  Filled 2017-07-08: qty 30

## 2017-07-08 MED ORDER — BISACODYL 10 MG RE SUPP: 10.0000 mg | Freq: Every day | RECTAL | Status: DC | PRN

## 2017-07-08 MED ORDER — MORPHINE SULFATE (PF) 4 MG/ML IV SOLN
INTRAVENOUS | Status: DC | PRN
  Administered 2017-07-08: 4 mg via INTRAMUSCULAR

## 2017-07-08 MED ORDER — PROPOFOL 500 MG/50ML IV EMUL
INTRAVENOUS | Status: AC
  Filled 2017-07-08: qty 50

## 2017-07-08 MED ORDER — PANTOPRAZOLE SODIUM 40 MG PO TBEC
40.0000 mg | DELAYED_RELEASE_TABLET | Freq: Every day | ORAL | Status: DC
  Administered 2017-07-08 – 2017-07-12 (×5): 40 mg via ORAL
  Filled 2017-07-08 (×5): qty 1

## 2017-07-08 MED ORDER — SODIUM CHLORIDE 0.45 % IV SOLN
INTRAVENOUS | Status: DC
  Administered 2017-07-08 – 2017-07-09 (×3): via INTRAVENOUS

## 2017-07-08 MED ORDER — TRANEXAMIC ACID 1000 MG/10ML IV SOLN
1000.0000 mg | Freq: Once | INTRAVENOUS | Status: AC
  Administered 2017-07-08: 1000 mg via INTRAVENOUS
  Filled 2017-07-08: qty 1100

## 2017-07-08 MED ORDER — BUPIVACAINE HCL (PF) 0.25 % IJ SOLN
INTRAMUSCULAR | Status: DC | PRN
  Administered 2017-07-08: 30 mL

## 2017-07-08 MED ORDER — PHENYLEPHRINE HCL 10 MG/ML IJ SOLN
INTRAMUSCULAR | Status: DC | PRN
  Administered 2017-07-08 (×3): 100 ug via INTRAVENOUS

## 2017-07-08 MED ORDER — BUPIVACAINE HCL (PF) 0.5 % IJ SOLN
INTRAMUSCULAR | Status: AC
Start: 2017-07-08 — End: ?
  Filled 2017-07-08: qty 30

## 2017-07-08 MED ORDER — GABAPENTIN 300 MG PO CAPS
ORAL_CAPSULE | ORAL | Status: AC
  Filled 2017-07-08: qty 1

## 2017-07-08 MED ORDER — DOCUSATE SODIUM 100 MG PO CAPS
100.0000 mg | ORAL_CAPSULE | Freq: Two times a day (BID) | ORAL | Status: DC
  Administered 2017-07-08 – 2017-07-12 (×8): 100 mg via ORAL
  Filled 2017-07-08 (×8): qty 1

## 2017-07-08 MED ORDER — TIZANIDINE HCL 4 MG PO TABS
4.0000 mg | ORAL_TABLET | Freq: Every day | ORAL | Status: DC
  Administered 2017-07-08 – 2017-07-11 (×3): 4 mg via ORAL
  Filled 2017-07-08 (×7): qty 1

## 2017-07-08 MED ORDER — METOCLOPRAMIDE HCL 5 MG/ML IJ SOLN: 5.0000 mg | Freq: Three times a day (TID) | INTRAMUSCULAR | Status: DC | PRN

## 2017-07-08 MED ORDER — CARVEDILOL 25 MG PO TABS
25.0000 mg | ORAL_TABLET | Freq: Two times a day (BID) | ORAL | Status: DC
  Administered 2017-07-09: 25 mg via ORAL
  Filled 2017-07-08: qty 1

## 2017-07-08 MED ORDER — INSULIN ASPART 100 UNIT/ML ~~LOC~~ SOLN
0.0000 [IU] | Freq: Three times a day (TID) | SUBCUTANEOUS | Status: DC
  Administered 2017-07-09: 2 [IU] via SUBCUTANEOUS
  Administered 2017-07-09: 3 [IU] via SUBCUTANEOUS
  Administered 2017-07-10 – 2017-07-11 (×4): 2 [IU] via SUBCUTANEOUS
  Administered 2017-07-12: 3 [IU] via SUBCUTANEOUS
  Filled 2017-07-08 (×7): qty 1

## 2017-07-08 MED ORDER — METHOCARBAMOL 500 MG PO TABS
500.0000 mg | ORAL_TABLET | Freq: Four times a day (QID) | ORAL | Status: DC | PRN
  Administered 2017-07-09 – 2017-07-11 (×3): 500 mg via ORAL
  Filled 2017-07-08 (×3): qty 1

## 2017-07-08 MED ORDER — GLYCOPYRROLATE 0.2 MG/ML IJ SOLN
INTRAMUSCULAR | Status: DC | PRN
  Administered 2017-07-08: 0.2 mg via INTRAVENOUS

## 2017-07-08 MED ORDER — BUPIVACAINE LIPOSOME 1.3 % IJ SUSP
INTRAMUSCULAR | Status: AC
  Filled 2017-07-08: qty 20

## 2017-07-08 MED ORDER — VANCOMYCIN HCL IN DEXTROSE 1-5 GM/200ML-% IV SOLN
INTRAVENOUS | Status: AC
Start: 2017-07-08 — End: 2017-07-08
  Filled 2017-07-08: qty 200

## 2017-07-08 MED ORDER — ACETAMINOPHEN 10 MG/ML IV SOLN
INTRAVENOUS | Status: AC
  Filled 2017-07-08: qty 100

## 2017-07-08 MED ORDER — EZETIMIBE 10 MG PO TABS
10.0000 mg | ORAL_TABLET | Freq: Every day | ORAL | Status: DC
  Administered 2017-07-08 – 2017-07-12 (×5): 10 mg via ORAL
  Filled 2017-07-08 (×6): qty 1

## 2017-07-08 MED ORDER — ZOLPIDEM TARTRATE 5 MG PO TABS: 5.0000 mg | ORAL_TABLET | Freq: Every evening | ORAL | Status: DC | PRN

## 2017-07-08 MED ORDER — NEOMYCIN-POLYMYXIN B GU 40-200000 IR SOLN
Status: AC
  Filled 2017-07-08: qty 20

## 2017-07-08 MED ORDER — METHOCARBAMOL 1000 MG/10ML IJ SOLN: 500.0000 mg | Freq: Four times a day (QID) | INTRAVENOUS | Status: DC | PRN

## 2017-07-08 MED ORDER — CHLORHEXIDINE GLUCONATE CLOTH 2 % EX PADS
6.0000 | MEDICATED_PAD | Freq: Once | CUTANEOUS | Status: AC
  Administered 2017-07-08: 6 via TOPICAL

## 2017-07-08 MED ORDER — METFORMIN HCL 500 MG PO TABS
1000.0000 mg | ORAL_TABLET | Freq: Two times a day (BID) | ORAL | Status: DC
  Administered 2017-07-08 – 2017-07-10 (×4): 1000 mg via ORAL
  Filled 2017-07-08 (×5): qty 2

## 2017-07-08 MED ORDER — ONDANSETRON HCL 4 MG/2ML IJ SOLN
4.0000 mg | Freq: Four times a day (QID) | INTRAMUSCULAR | Status: DC | PRN
Start: 2017-07-08 — End: 2017-07-12

## 2017-07-08 MED ORDER — FENTANYL CITRATE (PF) 100 MCG/2ML IJ SOLN
INTRAMUSCULAR | Status: DC | PRN
  Administered 2017-07-08: 50 ug via INTRAVENOUS

## 2017-07-08 MED ORDER — CYANOCOBALAMIN 500 MCG PO TABS
500.0000 ug | ORAL_TABLET | Freq: Every day | ORAL | Status: DC
  Administered 2017-07-08 – 2017-07-12 (×5): 500 ug via ORAL
  Filled 2017-07-08 (×6): qty 1

## 2017-07-08 MED ORDER — SODIUM CHLORIDE FLUSH 0.9 % IV SOLN
INTRAVENOUS | Status: AC
  Filled 2017-07-08: qty 10

## 2017-07-08 MED ORDER — CELECOXIB 200 MG PO CAPS
200.0000 mg | ORAL_CAPSULE | ORAL | Status: AC
  Administered 2017-07-08: 200 mg via ORAL

## 2017-07-08 MED ORDER — BUPIVACAINE HCL (PF) 0.25 % IJ SOLN
INTRAMUSCULAR | Status: AC
  Filled 2017-07-08: qty 30

## 2017-07-08 MED ORDER — GABAPENTIN 400 MG PO CAPS
400.0000 mg | ORAL_CAPSULE | ORAL | Status: AC
  Administered 2017-07-09: 400 mg via ORAL
  Filled 2017-07-08: qty 1

## 2017-07-08 MED ORDER — ONDANSETRON HCL 4 MG PO TABS
4.0000 mg | ORAL_TABLET | Freq: Four times a day (QID) | ORAL | Status: DC | PRN
Start: 2017-07-08 — End: 2017-07-12

## 2017-07-08 MED ORDER — FERROUS SULFATE 325 (65 FE) MG PO TABS
325.0000 mg | ORAL_TABLET | Freq: Three times a day (TID) | ORAL | Status: DC
  Administered 2017-07-08 – 2017-07-12 (×11): 325 mg via ORAL
  Filled 2017-07-08 (×11): qty 1

## 2017-07-08 MED ORDER — NEOMYCIN-POLYMYXIN B GU 40-200000 IR SOLN
Status: DC | PRN
  Administered 2017-07-08: 14 mL

## 2017-07-08 MED ORDER — DIPHENHYDRAMINE HCL 12.5 MG/5ML PO ELIX
12.5000 mg | ORAL_SOLUTION | ORAL | Status: DC | PRN
  Filled 2017-07-08: qty 10

## 2017-07-08 MED ORDER — INSULIN GLARGINE 100 UNIT/ML ~~LOC~~ SOLN: 0.0000 [IU] | SUBCUTANEOUS | Status: DC

## 2017-07-08 MED ORDER — TRANEXAMIC ACID 1000 MG/10ML IV SOLN
INTRAVENOUS | Status: DC | PRN
  Administered 2017-07-08: 1000 mg via INTRAVENOUS

## 2017-07-08 MED ORDER — FENTANYL CITRATE (PF) 100 MCG/2ML IJ SOLN
INTRAMUSCULAR | Status: AC
  Filled 2017-07-08: qty 2

## 2017-07-08 MED ORDER — MAGNESIUM HYDROXIDE 400 MG/5ML PO SUSP: 30.0000 mL | Freq: Every day | ORAL | Status: DC | PRN

## 2017-07-08 SURGICAL SUPPLY — 60 items
AUTOTRANSFUS HAS 1/8 (MISCELLANEOUS) ×3
BLADE DEBAKEY 8.0 (BLADE) ×4 IMPLANT
BLADE DEBAKEY 8.0MM (BLADE) ×2
BLADE SAGITTAL WIDE XTHICK NO (BLADE) ×3 IMPLANT
CANISTER SUCT 1200ML W/VALVE (MISCELLANEOUS) ×3 IMPLANT
CANISTER SUCT 3000ML PPV (MISCELLANEOUS) ×3 IMPLANT
CAP KNEE TOTAL 3 SIGMA ×3 IMPLANT
CEMENT HV SMART SET (Cement) ×6 IMPLANT
CHLORAPREP W/TINT 26ML (MISCELLANEOUS) ×12 IMPLANT
COOLER POLAR GLACIER W/PUMP (MISCELLANEOUS) ×3 IMPLANT
CUFF TOURN 24 STER (MISCELLANEOUS) IMPLANT
CUFF TOURN 30 STER DUAL PORT (MISCELLANEOUS) ×3 IMPLANT
DRAPE INCISE IOBAN 66X60 STRL (DRAPES) ×3 IMPLANT
DRAPE SHEET LG 3/4 BI-LAMINATE (DRAPES) ×9 IMPLANT
DRSG AQUACEL AG ADV 3.5X10 (GAUZE/BANDAGES/DRESSINGS) IMPLANT
DRSG AQUACEL AG ADV 3.5X14 (GAUZE/BANDAGES/DRESSINGS) ×3 IMPLANT
ELECT REM PT RETURN 9FT ADLT (ELECTROSURGICAL) ×3
ELECTRODE REM PT RTRN 9FT ADLT (ELECTROSURGICAL) ×1 IMPLANT
GLOVE BIO SURGEON STRL SZ7 (GLOVE) ×3 IMPLANT
GLOVE BIO SURGEON STRL SZ7.5 (GLOVE) ×6 IMPLANT
GLOVE BIO SURGEON STRL SZ8 (GLOVE) ×6 IMPLANT
GLOVE BIOGEL PI IND STRL 8 (GLOVE) ×2 IMPLANT
GLOVE BIOGEL PI IND STRL 8.5 (GLOVE) ×2 IMPLANT
GLOVE BIOGEL PI INDICATOR 8 (GLOVE) ×4
GLOVE BIOGEL PI INDICATOR 8.5 (GLOVE) ×4
GLOVE INDICATOR 8.0 STRL GRN (GLOVE) ×3 IMPLANT
GLOVE SURG ORTHO 8.5 STRL (GLOVE) ×3 IMPLANT
GOWN STRL REUS W/ TWL LRG LVL4 (GOWN DISPOSABLE) ×2 IMPLANT
GOWN STRL REUS W/TWL LRG LVL4 (GOWN DISPOSABLE) ×7 IMPLANT
HOLDER FOLEY CATH W/STRAP (MISCELLANEOUS) ×3 IMPLANT
IMMBOLIZER KNEE 19 BLUE UNIV (SOFTGOODS) ×3 IMPLANT
KIT TURNOVER KIT A (KITS) ×3 IMPLANT
NEEDLE SPNL 20GX3.5 QUINCKE YW (NEEDLE) ×3 IMPLANT
NS IRRIG 1000ML POUR BTL (IV SOLUTION) ×3 IMPLANT
PACK TOTAL KNEE (MISCELLANEOUS) ×3 IMPLANT
PAD DE MAYO PRESSURE PROTECT (MISCELLANEOUS) ×3 IMPLANT
PAD WRAPON POLAR KNEE (MISCELLANEOUS) ×1 IMPLANT
PULSAVAC PLUS IRRIG FAN TIP (DISPOSABLE) ×3
SOL .9 NS 3000ML IRR  AL (IV SOLUTION) ×2
SOL .9 NS 3000ML IRR UROMATIC (IV SOLUTION) ×1 IMPLANT
SPONGE DRAIN TRACH 4X4 STRL 2S (GAUZE/BANDAGES/DRESSINGS) ×3 IMPLANT
SPONGE LAP 18X18 RF (DISPOSABLE) IMPLANT
STAPLER SKIN PROX 35W (STAPLE) ×3 IMPLANT
SUCTION FRAZIER HANDLE 10FR (MISCELLANEOUS) ×2
SUCTION TUBE FRAZIER 10FR DISP (MISCELLANEOUS) ×1 IMPLANT
SUT BONE WAX W31G (SUTURE) ×3 IMPLANT
SUT DVC 2 QUILL PDO  T11 36X36 (SUTURE) ×2
SUT DVC 2 QUILL PDO T11 36X36 (SUTURE) ×1 IMPLANT
SUT QUILL PDO 0 36 36 VIOLET (SUTURE) ×3 IMPLANT
SYR 20CC LL (SYRINGE) ×9 IMPLANT
SYR 3ML 18GX1 1/2 (SYRINGE) ×3 IMPLANT
SYSTEM AUTOTRANSFUS DUAL TROCR (MISCELLANEOUS) ×1 IMPLANT
Self adhering, sterile post operative electrodes ×3 IMPLANT
TAPE MICROFOAM 4IN (TAPE) ×3 IMPLANT
TAPE TRANSPORE STRL 2 31045 (GAUZE/BANDAGES/DRESSINGS) ×3 IMPLANT
TIP FAN IRRIG PULSAVAC PLUS (DISPOSABLE) ×1 IMPLANT
TOWER CARTRIDGE SMART MIX (DISPOSABLE) ×3 IMPLANT
TRAY FOLEY MTR SLVR 16FR STAT (SET/KITS/TRAYS/PACK) ×3 IMPLANT
TUBE SUCT KAM VAC (TUBING) ×3 IMPLANT
WRAPON POLAR PAD KNEE (MISCELLANEOUS) ×3

## 2017-07-08 NOTE — NC FL2 (Signed)
[place] LEVEL OF CARE SCREENING TOOL     IDENTIFICATION  Patient Name: Erik Collins Birthdate: 1945/11/30 Sex: male Admission Date (Current Location): 07/08/2017  [place] and [place] Number:  Engineering geologist and Address:  [facility], [address], [place], [place] [address]      Provider Number: [id number]  Attending Physician Name and Address:  Earnestine Leys, MD  Relative Name and Phone Number:       Current Level of Care: Hospital Recommended Level of Care: [place] Prior Approval Number:    Date Approved/Denied:   PASRR Number: [id number])  Discharge Plan: SNF    Current Diagnoses: Patient Active Problem List   Diagnosis Date Noted  . 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 Carotid stenosis 10/05/2016  . Essential hypertension 10/05/2016  . Hyperlipidemia 10/05/2016  . Diabetes ([place]) 10/05/2016    Orientation RESPIRATION BLADDER Height & Weight     Self, Time, Situation, Place  Normal Continent Weight: 189 lb (85.7 kg) Height:  5\' 11"  (180.3 cm)  BEHAVIORAL SYMPTOMS/MOOD NEUROLOGICAL BOWEL NUTRITION STATUS      Continent Diet(Diet: Regular )  AMBULATORY STATUS COMMUNICATION OF NEEDS Skin   Extensive Assist Verbally Surgical wounds(Incision: Left Knee. )                       Personal Care Assistance Level of Assistance  Bathing, Feeding, Dressing Bathing Assistance: Limited assistance Feeding assistance: Independent Dressing Assistance: Limited assistance     Functional Limitations Info  Sight, Hearing, Speech Sight Info: Adequate Hearing Info: Adequate Speech Info: Adequate    SPECIAL CARE FACTORS FREQUENCY  PT (By licensed PT), OT (By licensed OT)     PT Frequency: (5) OT Frequency: (5)            Contractures      Additional Factors Info  Code Status, Allergies Code Status Info: (Full Code. ) Allergies Info: (No Known Allergies. )           Current  Medications (07/08/2017):  This is the current hospital active medication list Current Facility-Administered Medications  Medication Dose Route Frequency Provider Last Rate Last Dose  . 0.45 % sodium chloride infusion   Intravenous Continuous Earnestine Leys, MD 100 mL/hr at 07/08/17 1314    . alum & mag hydroxide-simeth (MAALOX/MYLANTA) 200-200-20 MG/5ML suspension 30 mL  30 mL Oral Q4H PRN Earnestine Leys, MD      . bisacodyl (DULCOLAX) suppository 10 mg  10 mg Rectal Daily PRN Earnestine Leys, MD      . Derrill Memo ON 07/09/2017] canagliflozin [facility]) tablet 100 mg  100 mg Oral QAC breakfast Earnestine Leys, MD      . carvedilol (COREG) tablet 25 mg  25 mg Oral BID Earnestine Leys, MD      . celecoxib (CELEBREX) 200 MG capsule           . clonazePAM (KLONOPIN) tablet 1 mg  1 mg Oral QHS Earnestine Leys, MD      . diphenhydrAMINE (BENADRYL) 12.5 MG/5ML elixir 12.5-25 mg  12.5-25 mg Oral Q4H PRN Earnestine Leys, MD      . docusate sodium (COLACE) capsule 100 mg  100 mg Oral BID Earnestine Leys, MD      . Derrill Memo ON 07/09/2017] enoxaparin (LOVENOX) injection 30 mg  30 mg Subcutaneous Q24H Earnestine Leys, MD      . ezetimibe (ZETIA) tablet 10 mg  10 mg Oral Daily Earnestine Leys, MD      .  ferrous sulfate tablet 325 mg  325 mg Oral TID Loni Dolly, MD      . gabapentin (NEURONTIN) 300 MG capsule           . [START ON 07/09/2017] gabapentin (NEURONTIN) capsule 400 mg  400 mg Oral On Call to OR Earnestine Leys, MD      . HYDROcodone-acetaminophen [facility]) 7.5-325 MG per tablet 1 tablet  1 tablet Oral Q4H PRN Earnestine Leys, MD      . insulin aspart (novoLOG) injection 0-15 Units  0-15 Units Subcutaneous TID WC Earnestine Leys, MD      . insulin glargine (LANTUS) injection 0-40 Units  0-40 Units Subcutaneous See admin instructions Earnestine Leys, MD      . magnesium hydroxide (MILK OF MAGNESIA) suspension 30 mL  30 mL Oral Daily PRN Earnestine Leys, MD      . meloxicam [facility]) tablet 15 mg  15 mg Oral Once  Earnestine Leys, MD      . menthol-cetylpyridinium (CEPACOL) lozenge 3 mg  1 lozenge Oral PRN Earnestine Leys, MD       Or  . phenol (CHLORASEPTIC) mouth spray 1 spray  1 spray Mouth/Throat PRN Earnestine Leys, MD      . metFORMIN (GLUCOPHAGE) tablet 1,000 mg  1,000 mg Oral BID WC Earnestine Leys, MD      . methocarbamol (ROBAXIN) tablet 500 mg  500 mg Oral Q6H PRN Earnestine Leys, MD       Or  . methocarbamol (ROBAXIN) 500 mg in dextrose 5 % 50 mL IVPB  500 mg Intravenous Q6H PRN Earnestine Leys, MD      . metoCLOPramide (REGLAN) tablet 5-10 mg  5-10 mg Oral Q8H PRN Earnestine Leys, MD       Or  . metoCLOPramide (REGLAN) injection 5-10 mg  5-10 mg Intravenous Q8H PRN Earnestine Leys, MD      . ondansetron [facility]) tablet 4 mg  4 mg Oral Q6H PRN Earnestine Leys, MD       Or  . ondansetron [facility]) injection 4 mg  4 mg Intravenous Q6H PRN Earnestine Leys, MD      . pantoprazole (PROTONIX) EC tablet 40 mg  40 mg Oral Daily Earnestine Leys, MD      . Derrill Memo ON 07/09/2017] pneumococcal 23 valent vaccine (PNU-IMMUNE) injection 0.5 mL  0.5 mL Intramuscular Tomorrow-1000 Earnestine Leys, MD      . sodium chloride flush 0.9 % injection           . sodium phosphate (FLEET) 7-19 GM/118ML enema 1 enema  1 enema Rectal Once PRN Earnestine Leys, MD      . tiZANidine (ZANAFLEX) tablet 4 mg  4 mg Oral QHS Earnestine Leys, MD      . vancomycin [facility]) 1-5 GM/200ML-% IVPB           . vancomycin (VANCOCIN) IVPB 1000 mg/200 mL premix  1,000 mg Intravenous Q12H Earnestine Leys, MD      . vitamin B-12 (CYANOCOBALAMIN) tablet 500 mcg  500 mcg Oral Daily Earnestine Leys, MD      . zolpidem Lorrin Mais) tablet 5 mg  5 mg Oral QHS PRN Earnestine Leys, MD         Discharge Medications: Please see discharge summary for a list of discharge medications.  Relevant Imaging Results:  Relevant Lab Results:   Additional Information (SSN: [id number])  Debe Anfinson, Veronia Beets, LCSW

## 2017-07-08 NOTE — H&P (Signed)
THE PATIENT WAS SEEN PRIOR TO SURGERY TODAY.  HISTORY, ALLERGIES, HOME MEDICATIONS AND OPERATIVE PROCEDURE WERE REVIEWED. RISKS AND BENEFITS OF SURGERY DISCUSSED WITH PATIENT AGAIN.  NO CHANGES FROM INITIAL HISTORY AND PHYSICAL NOTED.    

## 2017-07-08 NOTE — Anesthesia Procedure Notes (Signed)
Spinal  Patient location during procedure: OR Start time: 07/08/2017 7:48 AM Staffing Resident/CRNA: Willette Alma, CRNA Performed: resident/CRNA  Preanesthetic Checklist Completed: patient identified, site marked, surgical consent, pre-op evaluation, timeout performed, IV checked, risks and benefits discussed and monitors and equipment checked Spinal Block Patient position: sitting Prep: ChloraPrep Patient monitoring: heart rate, continuous pulse ox and blood pressure Approach: midline Location: L3-4 Injection technique: single-shot Needle Needle type: Pencan  Needle gauge: 24 G Needle length: 12.7 cm Assessment Sensory level: T10 Additional Notes Pt tolerated procedure well. VSS.

## 2017-07-08 NOTE — Evaluation (Signed)
Physical Therapy Evaluation Patient Details Name: Erik Collins MRN: [id number] DOB: 27-Oct-1945 Today's Date: 07/08/2017   History of Present Illness  Patient is a 72 year old male admitted s/p L TKA.  PMH includes MI, lung CA, DM, CA and arthritis.  Clinical Impression  Patient is a 72 year old male who lives in a one story home with his wife.  He is independent without AD and able to work part time at baseline.  He is in bed upon PT arrival and reports 2/10 pain in L knee.  He is able to perform bed mobility with min A for sequencing and initiation of movement.  Pt is able to sit at EOB with good balance and presented with good overall strength with knee flexion and extension being limited in L LE due to pain.  He reports no sensation loss in feet with the exception of mild chronic neuropathy. Pt able to stand with RW and ambulate to chair and back to bed with VC's for management of RW and WB status.  PT provided education concerning use of Polar Care, prevention of DVT and assisted nursing in donning CPM.  Pt able to achieve 0-70 degrees of L knee extension/flexion in supine.  Pt will benefit from skilled PT with focus on strength, knee ROM, pain management, functional mobility and management of RW.    Follow Up Recommendations Home health PT    Equipment Recommendations  Rolling walker with 5" wheels    Recommendations for Other Services       Precautions / Restrictions Precautions Precautions: Knee;Fall Restrictions Weight Bearing Restrictions: Yes LLE Weight Bearing: Partial weight bearing LLE Partial Weight Bearing Percentage or Pounds: 50%      Mobility  Bed Mobility Overal bed mobility: Needs Assistance Bed Mobility: Supine to Sit;Sit to Supine     Supine to sit: Min assist Sit to supine: Min assist   General bed mobility comments: Able to perform bed mobility with min A for placement of LE.  Transfers Overall transfer level: Needs assistance Equipment used: Rolling  walker (2 wheeled) Transfers: Sit to/from Stand Sit to Stand: Min guard         General transfer comment: Able to stand without physical assist.  Provided VC's for proper use of RW and management of PWB status.  Ambulation/Gait Ambulation/Gait assistance: Min guard Ambulation Distance (Feet): 8 Feet Assistive device: Rolling walker (2 wheeled)     Gait velocity interpretation: <1.8 ft/sec, indicate of risk for recurrent falls General Gait Details: Low foot clearance and antalgic gait but able to achieve step through gait pattern.  PT provided VC's for sequencing with RW.  Stairs            Wheelchair Mobility    Modified Rankin (Stroke Patients Only)       Balance Overall balance assessment: Modified Independent                                           Pertinent Vitals/Pain Pain Assessment: 0-10 Pain Score: 2  Pain Location: L knee Pain Intervention(s): Monitored during session    Home Living Family/patient expects to be discharged to:: Private residence Living Arrangements: Spouse/significant other Available Help at Discharge: Family;Available 24 hours/day Type of Home: House Home Access: Stairs to enter Entrance Stairs-Rails: None Entrance Stairs-Number of Steps: 2 Home Layout: One level Home Equipment: None  Prior Function Level of Independence: Independent               Hand Dominance   Dominant Hand: Right    Extremity/Trunk Assessment   Upper Extremity Assessment Upper Extremity Assessment: Overall WFL for tasks assessed(Grossly 4/5 bilaterally)    Lower Extremity Assessment Lower Extremity Assessment: Overall WFL for tasks assessed(Grossly 4+/5 with the exception of L knee mobility limited by pain.)    Cervical / Trunk Assessment Cervical / Trunk Assessment: Normal  Communication   Communication: No difficulties  Cognition Arousal/Alertness: Awake/alert Behavior During Therapy: WFL for tasks  assessed/performed Overall Cognitive Status: Within Functional Limits for tasks assessed                                 General Comments: A&O x4.  Able to follow commands consistently.      General Comments      Exercises Total Joint Exercises Goniometric ROM: L knee ext/flex: 0-70 degrees   Assessment/Plan    PT Assessment Patient needs continued PT services  PT Problem List Decreased strength;Decreased mobility;Decreased range of motion;Decreased coordination;Decreased activity tolerance;Decreased balance;Decreased knowledge of use of DME;Pain;Decreased knowledge of precautions       PT Treatment Interventions Therapeutic activities;DME instruction;Gait training;Therapeutic exercise;Stair training;Balance training;Functional mobility training;Neuromuscular re-education;Patient/family education    PT Goals (Current goals can be found in the Care Plan section)  Acute Rehab PT Goals Patient Stated Goal: To return home and back to work as soon as possible. PT Goal Formulation: With patient Time For Goal Achievement: 07/22/17 Potential to Achieve Goals: Good    Frequency BID   Barriers to discharge        Co-evaluation               AM-PAC PT "6 Clicks" Daily Activity  Outcome Measure Difficulty turning over in bed (including adjusting bedclothes, sheets and blankets)?: A Little Difficulty moving from lying on back to sitting on the side of the bed? : A Little Difficulty sitting down on and standing up from a chair with arms (e.g., wheelchair, bedside commode, etc,.)?: A Little Help needed moving to and from a bed to chair (including a wheelchair)?: A Little Help needed walking in hospital room?: A Little Help needed climbing 3-5 steps with a railing? : A Little 6 Click Score: 18    End of Session Equipment Utilized During Treatment: Gait belt Activity Tolerance: Patient tolerated treatment well Patient left: in bed;with bed alarm set;with  nursing/sitter in room;with call bell/phone within reach;with family/visitor present Nurse Communication: Mobility status PT Visit Diagnosis: Muscle weakness (generalized) (M62.81);Pain Pain - Right/Left: Left Pain - part of body: Knee    Time: 1600-1640 PT Time Calculation (min) (ACUTE ONLY): 40 min   Charges:   PT Evaluation $PT Eval Low Complexity: 1 Low PT Treatments $Therapeutic Activity: 8-22 mins   PT G Codes:   PT G-Codes **NOT FOR INPATIENT CLASS** Functional Assessment Tool Used: AM-PAC 6 Clicks Basic Mobility    Roxanne Gates, PT, DPT   Roxanne Gates 07/08/2017, 4:56 PM

## 2017-07-08 NOTE — Anesthesia Post-op Follow-up Note (Signed)
Anesthesia QCDR form completed.        

## 2017-07-08 NOTE — Op Note (Signed)
DATE OF SURGERY:  07/08/2017 TIME: 10:36 AM  PATIENT NAME:  Erik Collins   AGE: 72 y.o.    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  PREOPERATIVE INDICATIONS:  Erik Collins is a 72 y.o. year old male with end stage bone on bone degenerative arthritis of the knee who failed conservative treatment, including injections, antiinflammatories, activity modification, and assistive devices, and had significant impairment of their activities of daily living, and elected for Total Knee Arthroplasty.   The risks, benefits, and alternatives were discussed at length including but not limited to the risks of infection, bleeding, nerve injury, stiffness, blood clots, the need for revision surgery, cardiopulmonary complications, among others, and they were willing to proceed.  OPERATIVE FINDINGS AND UNIQUE ASPECTS OF THE CASE:  SEVERE ARTHRITIS AND SPURRING THROUGHOUT KNEE  OPERATIVE DESCRIPTION:   The patient was brought to the operative room and placed in a supine position. Spinal anesthesia was administered. IV VANCOMYCIN antibiotics were given. The lower extremity was prepped and draped in the usual sterile fashion. Time out was performed. The leg was elevated and exsanguinated and the tourniquet was inflated to 350 mmHg  An anterior midline incision was made.  Anterior quadriceps tendon splitting approach was performed. The patella was everted and osteophytes were removed. The anterior horn of the medial and lateral meniscus was removed.  Then the extramedullary tibial cutting jig was utilized making the appropriate cut using the anterior tibial  crest as a reference building in appropriate posterior slope. Care was taken during the cut to protect the medial and collateral ligaments. The proximal tibia was removed along with the posterior horns of the menisci. The PCL was sacrificed.  The distal femur was sized as above . Medial release was carried out. The anterior femoral cutting guide was aligned and centering hole made. The rotation guide was inserted and the anterior cutting guide pinned in place, and was in excellent alignment. The posterior femoral cuts were made. The flexion gap was established. The distal femoral cutting guide was introduced at 4 of valgus. This was pinned and the distal femoral cut made. The extension gap was established and was stable. The finishing guide was applied and finishing cuts made. The Mchale retractor was inserted and the keeled tibial trial was pinned in place. Centering hole was made and the keel inserted. The femoral component was inserted along with a polyethylene  insert and the knee articulated.  Extension and flexion showed good stability throughout. The patella was then sized and cut made for the patellar component. Centering holes were made. The trial was inserted and the knee articulated nicely with no need for lateral release. The trials were all removed and the knee thoroughly irrigated with pulsed lavage. Exparil was injected. The knee was dried and the cement mixed. The  keeled tibial component,  femoral component and patellar components were all cemented in place and excess cement was removed. The cement was allowed to harden for 10 minutes. Further irrigation was carried out. Bone wax was applied to all raw bony surfaces. Autovac drains were inserted. Quarter percent plain Marcaine, Toradol and morphine were injected. The capsule was closed with #  2 Quill suture, and the subcutaneous tissues were closed with 0 Quill suture. The skin was closed with staples.Sponge and needle counts were correct.   Aquacel dressing with TENS pads and a dry sterile dressing were applied. Polar Care and knee immobilizer were applied. Tourniquet was deflated with excellent return of blood flow to foot. Patient was transferred to a hospital bed and taken to the recovery room in good condition.  Park Breed, MD

## 2017-07-08 NOTE — Anesthesia Preprocedure Evaluation (Signed)
Anesthesia Evaluation  Patient identified by MRN, date of birth, ID band Patient awake    Reviewed: Allergy & Precautions, H&P , NPO status , Patient's Chart, lab work & pertinent test results, reviewed documented beta blocker date and time   Airway Mallampati: I  TM Distance: >3 FB Neck ROM: full    Dental  (+) Edentulous Upper, Edentulous Lower   Pulmonary sleep apnea , COPD, former smoker,    breath sounds clear to auscultation       Cardiovascular hypertension, On Home Beta Blockers + CAD, + Past MI, + CABG and + Peripheral Vascular Disease   Rhythm:Regular Rate:Normal     Neuro/Psych    GI/Hepatic Neg liver ROS, GERD  Medicated and Controlled,  Endo/Other  diabetes, Well Controlled, Type 2  Renal/GU negative Renal ROS     Musculoskeletal  (+) Arthritis , Osteoarthritis,    Abdominal   Peds  Hematology   Anesthesia Other Findings   Reproductive/Obstetrics                             Anesthesia Physical  Anesthesia Plan  ASA: III  Anesthesia Plan: Spinal   Post-op Pain Management:    Induction: Intravenous  PONV Risk Score and Plan:   Airway Management Planned: Nasal Cannula  Additional Equipment:   Intra-op Plan:   Post-operative Plan:   Informed Consent: I have reviewed the patients History and Physical, chart, labs and discussed the procedure including the risks, benefits and alternatives for the proposed anesthesia with the patient or authorized representative who has indicated his/her understanding and acceptance.   Dental Advisory Given  Plan Discussed with: Anesthesiologist, CRNA and Surgeon  Anesthesia Plan Comments:         Anesthesia Quick Evaluation

## 2017-07-08 NOTE — Transfer of Care (Signed)
Immediate Anesthesia Transfer of Care Note  Patient: Erik Collins  Procedure(s) Performed: TOTAL KNEE ARTHROPLASTY (Left Knee)  Patient Location: PACU  Anesthesia Type:MAC and Spinal  Level of Consciousness: awake, alert , oriented and patient cooperative  Airway & Oxygen Therapy: Patient Spontanous Breathing and Patient connected to nasal cannula oxygen  Post-op Assessment: Report given to RN and Post -op Vital signs reviewed and stable  Post vital signs: stable  Last Vitals:  Vitals Value Taken Time  BP 120/62 07/08/2017 10:14 AM  Temp    Pulse 58 07/08/2017 10:17 AM  Resp 15 07/08/2017 10:17 AM  SpO2 99 % 07/08/2017 10:17 AM  Vitals shown include unvalidated device data.  Last Pain:  Vitals:   07/08/17 0604  TempSrc: Tympanic  PainSc: 5          Complications: No apparent anesthesia complications

## 2017-07-09 LAB — CBC
HCT: 31.7 % — ABNORMAL LOW (ref 40.0–52.0)
Hemoglobin: 10.7 g/dL — ABNORMAL LOW (ref 13.0–18.0)
MCH: 30.2 pg (ref 26.0–34.0)
MCHC: 33.7 g/dL (ref 32.0–36.0)
MCV: 89.4 fL (ref 80.0–100.0)
Platelets: 167 10*3/uL (ref 150–440)
RBC: 3.54 MIL/uL — ABNORMAL LOW (ref 4.40–5.90)
RDW: 13.9 % (ref 11.5–14.5)
WBC: 7.7 10*3/uL (ref 3.8–10.6)

## 2017-07-09 LAB — BASIC METABOLIC PANEL
Anion gap: 4 — ABNORMAL LOW (ref 5–15)
BUN: 21 mg/dL — ABNORMAL HIGH (ref 6–20)
CO2: 28 mmol/L (ref 22–32)
Calcium: 8.4 mg/dL — ABNORMAL LOW (ref 8.9–10.3)
Chloride: 105 mmol/L (ref 101–111)
Creatinine, Ser: 1.03 mg/dL (ref 0.61–1.24)
GFR calc Af Amer: >60 mL/min (ref >60)
GFR calc non Af Amer: >60 mL/min (ref >60)
Glucose, Bld: 116 mg/dL — ABNORMAL HIGH (ref 65–99)
Potassium: 4.5 mmol/L (ref 3.5–5.1)
Sodium: 137 mmol/L (ref 135–145)

## 2017-07-09 LAB — GLUCOSE, CAPILLARY
Glucose-Capillary: 113 mg/dL — ABNORMAL HIGH (ref 65–99)
Glucose-Capillary: 122 mg/dL — ABNORMAL HIGH (ref 65–99)
Glucose-Capillary: 131 mg/dL — ABNORMAL HIGH (ref 65–99)

## 2017-07-09 MED ORDER — HYDROCODONE-ACETAMINOPHEN 7.5-325 MG PO TABS
1.0000 | ORAL_TABLET | Freq: Four times a day (QID) | ORAL | Status: DC | PRN
Start: 2017-07-09 — End: 2017-07-09
  Administered 2017-07-09: 2 via ORAL
  Filled 2017-07-09: qty 2

## 2017-07-09 MED ORDER — ENOXAPARIN SODIUM 40 MG/0.4ML ~~LOC~~ SOLN
40.0000 mg | SUBCUTANEOUS | Status: DC
  Administered 2017-07-10 – 2017-07-12 (×3): 40 mg via SUBCUTANEOUS
  Filled 2017-07-09 (×3): qty 0.4

## 2017-07-09 MED ORDER — OXYCODONE HCL 5 MG PO TABS
5.0000 mg | ORAL_TABLET | Freq: Four times a day (QID) | ORAL | Status: DC | PRN
  Administered 2017-07-10: 5 mg via ORAL
  Administered 2017-07-10: 10 mg via ORAL
  Administered 2017-07-10 – 2017-07-12 (×5): 5 mg via ORAL
  Filled 2017-07-09 (×6): qty 1
  Filled 2017-07-09: qty 2
  Filled 2017-07-09: qty 1

## 2017-07-09 MED ORDER — ATROPINE SULFATE 1 MG/10ML IJ SOSY
0.5000 mg | PREFILLED_SYRINGE | Freq: Once | INTRAMUSCULAR | Status: AC
  Administered 2017-07-09: 0.5 mg via INTRAVENOUS
  Filled 2017-07-09: qty 10

## 2017-07-09 MED ORDER — IPRATROPIUM-ALBUTEROL 0.5-2.5 (3) MG/3ML IN SOLN
3.0000 mL | Freq: Four times a day (QID) | RESPIRATORY_TRACT | Status: DC
Start: 2017-07-09 — End: 2017-07-11
  Administered 2017-07-09 – 2017-07-10 (×3): 3 mL via RESPIRATORY_TRACT
  Filled 2017-07-09 (×5): qty 3

## 2017-07-09 MED ORDER — EMPAGLIFLOZIN 10 MG PO TABS
10.0000 mg | ORAL_TABLET | Freq: Every day | ORAL | Status: DC
  Administered 2017-07-09 – 2017-07-12 (×2): 10 mg via ORAL
  Filled 2017-07-09 (×2): qty 10

## 2017-07-09 MED ORDER — MORPHINE SULFATE (PF) 2 MG/ML IV SOLN
1.0000 mg | INTRAVENOUS | Status: DC | PRN
  Administered 2017-07-09 (×2): 1 mg via INTRAVENOUS
  Filled 2017-07-09 (×2): qty 1

## 2017-07-09 MED ORDER — ALBUTEROL SULFATE (2.5 MG/3ML) 0.083% IN NEBU: 2.5000 mg | INHALATION_SOLUTION | RESPIRATORY_TRACT | Status: DC | PRN

## 2017-07-09 NOTE — Progress Notes (Signed)
lovenox dose increased to 40mg  daily due to crcl >28ml/min  Charmaine Placido D Rayleigh Gillyard, Pharm.D, BCPS Clinical Pharmacist

## 2017-07-09 NOTE — Progress Notes (Signed)
Patient's heart rate dropped down to 31-49 as reported by RN.  Patient is asymptomatic but she noticed these changes after Vicodin was given.  Will discontinue Vicodin and Coreg.  Patient is started on oxycodone as needed for pain management we will continue close monitoring.  We will consult cardiology if patient becomes symptomatic.  Will give atropine as needed

## 2017-07-09 NOTE — Care Management (Signed)
Bundle patient. Scheduled for OP PT Tuesday, 5/28 @ 7am at the [place] office

## 2017-07-09 NOTE — Progress Notes (Signed)
Clinical Social Worker (CSW) received SNF consult. PT is recommending home health. RN case manager aware of above. Please reconsult if future social work needs arise. CSW signing off.   Yesha Muchow, LCSW [phone] 

## 2017-07-09 NOTE — Progress Notes (Signed)
Pt had 3.3ec pause, asymptomatic. Notified md. Cont to monitor.

## 2017-07-09 NOTE — Consult Note (Signed)
Reason for Consult: Bradycardia Referring Physician: [name] is an 72 y.o. male.  With history of COPD, CHF, coronary artery disease status post CABG, lung cancer status post lobectomy, COPD and other medical problems was admitted and had left total knee arthroplasty by [name].  Hospitalist consulted for bradycardia.  Patient is a symptomatic.  Denies any dizziness or palpitations or chest pain resting comfortably during my examination.  Family members at bedside  Past Medical History:  Diagnosis Date  . Arthritis   . Cancer ([place])    hx lung cancer  . CHF (congestive heart failure) ([place])   . COPD (chronic obstructive pulmonary disease) ([place])   . Coronary artery disease    s/p CABG  . Diabetes mellitus without complication ([place])   . GERD (gastroesophageal reflux disease)   . Hypercholesterolemia   . Hypertension   . Lung cancer ([place])   . Myocardial infarction [facility])    09/2009 f/up with Dr. Neoma Collins  . Sleep apnea     Past Surgical History:  Procedure Laterality Date  . BACK SURGERY    . COLONOSCOPY WITH PROPOFOL N/A 11/09/2014   Procedure: COLONOSCOPY WITH PROPOFOL;  Surgeon: Erik Class, MD;  Location: [facility] ENDOSCOPY;  Service: Endoscopy;  Laterality: N/A;  . CORONARY ANGIOPLASTY    . CORONARY ARTERY BYPASS GRAFT  09/2009   quadruple, done at [facility]  . ESOPHAGOGASTRODUODENOSCOPY (EGD) WITH PROPOFOL N/A 11/09/2014   Procedure: ESOPHAGOGASTRODUODENOSCOPY (EGD) WITH PROPOFOL;  Surgeon: Erik Class, MD;  Location: [facility] ENDOSCOPY;  Service: Endoscopy;  Laterality: N/A;  . left lower lobectomy  09/2009   at [facility]  . LUMBAR LAMINECTOMY/DECOMPRESSION MICRODISCECTOMY  12/17/2011   Procedure: LUMBAR LAMINECTOMY/DECOMPRESSION MICRODISCECTOMY 1 LEVEL;  Surgeon: Erik Moore, MD;  Location: [place] NEURO ORS;  Service: Neurosurgery;  Laterality: Right;  Right Lumbar five-sacral one microdiscectomy  . TOTAL KNEE ARTHROPLASTY Left 07/08/2017   Procedure: TOTAL  KNEE ARTHROPLASTY;  Surgeon: Erik Leys, MD;  Location: [facility] ORS;  Service: Orthopedics;  Laterality: Left;    Family History  Problem Relation Age of Onset  . Heart disease Mother   . Diabetes Mother   . Heart disease Father   . Stroke Father   . Heart attack Father     Social History:  reports that he quit smoking about 7 years ago. He has never used smokeless tobacco. He reports that he does not drink alcohol or use drugs.  Allergies: No Known Allergies  Medications: I have reviewed the patient's current medications.  Results for orders placed or performed during the hospital encounter of 07/08/17 (from the past 48 hour(s))  Glucose, capillary     Status: Abnormal   Collection Time: 07/08/17  6:04 AM  Result Value Ref Range   Glucose-Capillary 129 (H) 65 - 99 mg/dL  ABO/Rh     Status: None   Collection Time: 07/08/17  6:18 AM  Result Value Ref Range   ABO/RH(D)      AB POS Performed at [facility], [address] [place], [place] [address]   Glucose, capillary     Status: None   Collection Time: 07/08/17 10:17 AM  Result Value Ref Range   Glucose-Capillary 98 65 - 99 mg/dL  CBC     Status: Abnormal   Collection Time: 07/08/17  1:04 PM  Result Value Ref Range   WBC 7.8 3.8 - 10.6 K/uL   RBC 3.80 (L) 4.40 - 5.90 MIL/uL   Hemoglobin 11.5 (L) 13.0 -  18.0 g/dL   HCT 34.3 (L) 40.0 - 52.0 %   MCV 90.3 80.0 - 100.0 fL   MCH 30.2 26.0 - 34.0 pg   MCHC 33.5 32.0 - 36.0 g/dL   RDW 14.5 11.5 - 14.5 %   Platelets 182 150 - 440 K/uL    Comment: Performed at [facility], [address] [place], [place] [address]  Creatinine, serum     Status: None   Collection Time: 07/08/17  1:04 PM  Result Value Ref Range   Creatinine, Ser 0.93 0.61 - 1.24 mg/dL   GFR calc non Af Amer >60 >60 mL/min   GFR calc Af Amer >60 >60 mL/min    Comment: (NOTE) The eGFR has been calculated using the CKD EPI equation. This calculation has not been validated in all  clinical situations. eGFR's persistently <60 mL/min signify possible Chronic Kidney Disease. Performed at [facility], [address] [place], [place] [address]   Glucose, capillary     Status: None   Collection Time: 07/08/17  5:19 PM  Result Value Ref Range   Glucose-Capillary 96 65 - 99 mg/dL   Comment 1 Notify RN   Glucose, capillary     Status: Abnormal   Collection Time: 07/08/17  9:12 PM  Result Value Ref Range   Glucose-Capillary 100 (H) 65 - 99 mg/dL  CBC     Status: Abnormal   Collection Time: 07/09/17  3:35 AM  Result Value Ref Range   WBC 7.7 3.8 - 10.6 K/uL   RBC 3.54 (L) 4.40 - 5.90 MIL/uL   Hemoglobin 10.7 (L) 13.0 - 18.0 g/dL   HCT 31.7 (L) 40.0 - 52.0 %   MCV 89.4 80.0 - 100.0 fL   MCH 30.2 26.0 - 34.0 pg   MCHC 33.7 32.0 - 36.0 g/dL   RDW 13.9 11.5 - 14.5 %   Platelets 167 150 - 440 K/uL    Comment: Performed at [facility], [address] [place], [place] [address]  Basic metabolic panel     Status: Abnormal   Collection Time: 07/09/17  3:35 AM  Result Value Ref Range   Sodium 137 135 - 145 mmol/L   Potassium 4.5 3.5 - 5.1 mmol/L   Chloride 105 101 - 111 mmol/L   CO2 28 22 - 32 mmol/L   Glucose, Bld 116 (H) 65 - 99 mg/dL   BUN 21 (H) 6 - 20 mg/dL   Creatinine, Ser 1.03 0.61 - 1.24 mg/dL   Calcium 8.4 (L) 8.9 - 10.3 mg/dL   GFR calc non Af Amer >60 >60 mL/min   GFR calc Af Amer >60 >60 mL/min    Comment: (NOTE) The eGFR has been calculated using the CKD EPI equation. This calculation has not been validated in all clinical situations. eGFR's persistently <60 mL/min signify possible Chronic Kidney Disease.    Anion gap 4 (L) 5 - 15    Comment: Performed at [facility], [place], [place] [address]  Glucose, capillary     Status: Abnormal   Collection Time: 07/09/17  7:44 AM  Result Value Ref Range   Glucose-Capillary 113 (H) 65 - 99 mg/dL  Glucose, capillary     Status: Abnormal   Collection Time:  07/09/17 11:43 AM  Result Value Ref Range   Glucose-Capillary 122 (H) 65 - 99 mg/dL    Dg Knee Left Port  Result Date: 07/08/2017 CLINICAL DATA:  Postop left total knee arthroplasty. EXAM: PORTABLE LEFT  KNEE - 1-2 VIEW COMPARISON:  None FINDINGS: The hardware components of a left total knee arthroplasty device are identified. No periprosthetic fracture noted. Two surgical drains overlie the distal femur. Gas noted within the surrounding soft tissues. Anterior midline skin staples noted. IMPRESSION: 1. Status post left total knee arthroplasty. 2. No complications. Electronically Signed   By: Erik Moors M.D.   On: 07/08/2017 11:13    ROS:  CONSTITUTIONAL: Denies fevers, chills. Denies any fatigue, weakness.  EYES: Denies blurry vision, double vision, eye pain. EARS, NOSE, THROAT: Denies tinnitus, ear pain, hearing loss. RESPIRATORY: Denies cough, wheeze, shortness of breath.  CARDIOVASCULAR: Denies chest pain, palpitations, edema.  GASTROINTESTINAL: Denies nausea, vomiting, diarrhea, abdominal pain. Denies bright red blood per rectum. GENITOURINARY: Denies dysuria, hematuria. ENDOCRINE: Denies nocturia or thyroid problems. HEMATOLOGIC AND LYMPHATIC: Denies easy bruising or bleeding. SKIN: Denies rash or lesion. MUSCULOSKELETAL: Reporting left knee pain  nEUROLOGIC: Denies paralysis, paresthesias.  PSYCHIATRIC: Denies anxiety or depressive symptoms. Blood pressure (!) 127/92, pulse (!) 114, temperature (!) 97.5 F (36.4 C), temperature source Oral, resp. rate 16, height _0  (1.803 m), weight 85.7 kg (189 lb), SpO2 98 %.   PHYSICAL EXAMINATION:  GENERAL: Well-nourished, well-developed ,currently in no acute distress.  HEAD: Normocephalic, atraumatic.  EYES: Pupils equal, round, and reactive to light. Extraocular muscles intact. No scleral icterus.  MOUTH: Moist mucosal membranes. Dentition intact. No abscess noted. EARS, NOSE, THROAT: Clear without exudates. No external lesions.   NECK: Supple. No thyromegaly. No nodules. No JVD.  PULMONARY: Clear to auscultation bilaterally without wheezes, rales, or rhonchi. No use of accessory muscles. Good respiratory effort. CHEST: Nontender to palpation.  CARDIOVASCULAR: S1, S2, regular rate and rhythm. No murmurs, rubs, or gallops.  GASTROINTESTINAL: Soft, nontender, nondistended. No masses. Positive bowel sounds. No hepatosplenomegaly. MUSCULOSKELETAL: Left knee i pain dressing n  nEUROLOGIC: Cranial nerves II-XII intact. No gross focal neurological deficits. Sensation intact. Reflexes intact. SKIN: No ulcerations, lesions, rash, cyanosis. Skin warm, dry. Turgor intact. PSYCHIATRIC: Mood, affect within normal limits. Patient awake, alert, oriented x 3. Insight and judgment intact.   Assessment/Plan:   #ASymptomatic sinus bradycardia Continue to close monitor the patient on remote telemetry  we will check TSH and echocardiogram Patient is a symptomatic Electrolytes are in the normal range  #Chronic history of COPD no exacerbation at this time Will provide breathing treatments with duo nebs and albuterol as needed  #Essential hypertension Continue Coreg at this time as the patient's heart rate is around 55 and patient is asymptomatic  #Hyperlipidemia-check fasting lipid panel in a.m.   #Left total knee arthroplasty pain management Postop care by orthopedics    Thank you [name] for consulting hospitalist team to take care of this patient   TOTAL TIME TAKING CARE OF THIS PATIENT: 50mnutes.   Note: This dictation was prepared with Dragon dictation along with smaller phrase technology. Any transcriptional errors that result from this process are unintentional.   _1 @ Pager - [phone]/23/2019, 1:37 PM

## 2017-07-09 NOTE — Care Management Note (Signed)
Case Management Note  Patient Details  Name: Erik Collins MRN: [id number] Date of Birth: 1945/12/06  Subjective/Objective:  Spoke with patient. He is aware he has an outpatient appointment set up for next Tuesday, May 28 at 7 am. He denies difficulty getting to appointment. He will need a walker. Ordered from [facility]. He will discharge home on ASA.                    Action/Plan:   Expected Discharge Date:  07/10/17               Expected Discharge Plan:  OP Rehab  In-House Referral:     Discharge planning Services  CM Consult  Post Acute Care Choice:  Durable Medical Equipment Choice offered to:  Patient  DME Arranged:  [facility] rolling DME Agency:  [place]:    [facility] Agency:     Status of Service:  In process, will continue to follow  If discussed at Long Length of Stay Meetings, dates discussed:    Additional Comments:  Jolly Mango, RN 07/09/2017, 3:26 PM

## 2017-07-09 NOTE — Progress Notes (Signed)
Subjective: 1 Day Post-Op Procedure(s) (LRB): TOTAL KNEE ARTHROPLASTY (Left)   OOB in chair.  NOt much pain last night but some today.  Had some bradycardia earlier and Hospitalist is here to check him.  Pulse 119 now.    Patient reports pain as mild.  Objective:   VITALS:   Vitals:   07/09/17 0959 07/09/17 1116  BP:  (!) 127/92  Pulse:  (!) 114  Resp:    Temp: 99.1 F (37.3 C) (!) 97.5 F (36.4 C)  SpO2:  98%    Neurologically intact ABD soft Neurovascular intact Sensation intact distally Intact pulses distally Dorsiflexion/Plantar flexion intact Incision: dressing C/D/I  LABS Recent Labs    07/08/17 1304 07/09/17 0335  HGB 11.5* 10.7*  HCT 34.3* 31.7*  WBC 7.8 7.7  PLT 182 167    Recent Labs    07/08/17 1304 07/09/17 0335  NA  --  137  K  --  4.5  BUN  --  21*  CREATININE 0.93 1.03  GLUCOSE  --  116*    No results for input(s): LABPT, INR in the last 72 hours.   Assessment/Plan: 1 Day Post-Op Procedure(s) (LRB): TOTAL KNEE ARTHROPLASTY (Left)   [facility] diet Up with therapy D/C IV fluids Plan for discharge tomorrow   Start OPPT next week [facility] ASA 325 mg bid for 6 weeks post discharge.

## 2017-07-09 NOTE — Progress Notes (Signed)
Notified by [facility] tele that pts HR has been fluctuating from 31 to 49.  Pt asymptomatic. Will notifiy md.  Tele strip saved

## 2017-07-09 NOTE — Progress Notes (Signed)
Physical Therapy Treatment Patient Details Name: Erik Collins MRN: [id number] DOB: 10/23/1945 Today's Date: 07/09/2017    History of Present Illness 72 year old male admitted s/p L TKA.  PMH includes MI, lung CA, DM, CA and arthritis.    PT Comments    Participated in exercises as described below.  Pt stood with mis assist from chair.  Some difficulty from lower height chair vs bed and required min assist for balance and to stand fully.  After standing he was able to ambulate 100' PWB LLE.  He did require frequent verbal cues not to step too far into walker box several times during session.  Overall did well but continues to require +1 assist for safety.  Will need stair training prior to discharge.    Follow Up Recommendations  Home health PT     Equipment Recommendations  Rolling walker with 5" wheels    Recommendations for Other Services       Precautions / Restrictions Precautions Precautions: Knee;Fall Restrictions Weight Bearing Restrictions: Yes LLE Weight Bearing: Partial weight bearing LLE Partial Weight Bearing Percentage or Pounds: 50%    Mobility  Bed Mobility Overal bed mobility: Modified Independent Bed Mobility: Sit to Supine     Supine to sit: Min guard Sit to supine: Min guard   General bed mobility comments: Pt was able to get to EOB w/o direct assist  Transfers Overall transfer level: Needs assistance Equipment used: Rolling walker (2 wheeled) Transfers: Sit to/from Stand Sit to Stand: Min guard;Min assist         General transfer comment: slightly unsteady standing from chair this attempt.    Ambulation/Gait Ambulation/Gait assistance: Min guard Ambulation Distance (Feet): 100 Feet Assistive device: Rolling walker (2 wheeled) Gait Pattern/deviations: Step-to pattern   Gait velocity interpretation: <1.8 ft/sec, indicate of risk for recurrent falls General Gait Details: verbal cues not to get too close to walker.     Stairs              Wheelchair Mobility    Modified Rankin (Stroke Patients Only)       Balance Overall balance assessment: Needs assistance Sitting-balance support: Feet supported Sitting balance-Leahy Scale: Normal     Standing balance support: Bilateral upper extremity supported Standing balance-Leahy Scale: Fair Standing balance comment: heavy reliance on walker, at times gets too close during gait which affects balance.                            Cognition Arousal/Alertness: Awake/alert Behavior During Therapy: WFL for tasks assessed/performed Overall Cognitive Status: Within Functional Limits for tasks assessed                                        Exercises Total Joint Exercises Ankle Circles/Pumps: AROM;10 reps Quad Sets: Strengthening;10 reps Gluteal Sets: Strengthening;10 reps Short Arc Quad: Strengthening;AROM;10 reps Heel Slides: Strengthening;10 reps Hip ABduction/ADduction: Strengthening;10 reps Straight Leg Raises: AROM;10 reps Knee Flexion: 5 reps;PROM Goniometric ROM: 2-94 Other Exercises Other Exercises: supine ankle pumps, heel slides, quad sets and SLR  x 10 A/AAROM in supine, seated knee flexion x 5 and LAQ x 10    General Comments        Pertinent Vitals/Pain Pain Assessment: 0-10 Pain Score: 2  Pain Location: L knee Pain Intervention(s): Limited activity within patient's tolerance;Premedicated before session;Ice applied  Home Living                      Prior Function            PT Goals (current goals can now be found in the care plan section) Progress towards PT goals: Progressing toward goals    Frequency    BID      PT Plan Current plan remains appropriate    Co-evaluation              AM-PAC PT "6 Clicks" Daily Activity  Outcome Measure  Difficulty turning over in bed (including adjusting bedclothes, sheets and blankets)?: None Difficulty moving from lying on back to sitting on the  side of the bed? : None Difficulty sitting down on and standing up from a chair with arms (e.g., wheelchair, bedside commode, etc,.)?: Unable Help needed moving to and from a bed to chair (including a wheelchair)?: A Little Help needed walking in hospital room?: A Little Help needed climbing 3-5 steps with a railing? : A Little 6 Click Score: 18    End of Session Equipment Utilized During Treatment: Gait belt Activity Tolerance: Patient tolerated treatment well Patient left: in bed;with bed alarm set;with call bell/phone within reach;with family/visitor present;with nursing/sitter in room   PT Visit Diagnosis: Muscle weakness (generalized) (M62.81);Pain Pain - Right/Left: Left Pain - part of body: Knee     Time: 6962-9528 PT Time Calculation (min) (ACUTE ONLY): 23 min  Charges:  $Gait Training: 8-22 mins $Therapeutic Exercise: 8-22 mins                    G Codes:       Chesley Noon, PTA 07/09/17, 1:50 PM

## 2017-07-09 NOTE — Progress Notes (Signed)
Assessment done. Pt awake in bed with family members at bedside. Ice therapy intact to left knee and leg straight with heel off bed. C/o surgical pain 5/10. Medicated per mar for pain. Call bell in reach, instructed to call for needs. Family member will be staying the night with pt at bedside.

## 2017-07-09 NOTE — Anesthesia Postprocedure Evaluation (Signed)
Anesthesia Post Note  Patient: Erik Collins  Procedure(s) Performed: TOTAL KNEE ARTHROPLASTY (Left Knee)  Patient location during evaluation: Nursing Unit Anesthesia Type: Spinal Level of consciousness: oriented and awake and alert Pain management: pain level controlled Vital Signs Assessment: post-procedure vital signs reviewed and stable Respiratory status: spontaneous breathing and respiratory function stable Cardiovascular status: blood pressure returned to baseline and stable Postop Assessment: no headache, no backache, no apparent nausea or vomiting and spinal receding Anesthetic complications: no     Last Vitals:  Vitals:   07/09/17 0451 07/09/17 0743  BP: 110/63 115/62  Pulse: 65 65  Resp: 17 16  Temp: 37.9 C (!) 38 C  SpO2: 97% 94%    Last Pain:  Vitals:   07/09/17 0743  TempSrc: Oral  PainSc:                  Brantley Fling

## 2017-07-09 NOTE — Progress Notes (Addendum)
Physical Therapy Treatment Patient Details Name: Erik Collins MRN: [id number] DOB: 10-18-1945 Today's Date: 07/09/2017    History of Present Illness 72 year old male admitted s/p L TKA.  PMH includes MI, lung CA, DM, CA and arthritis.    PT Comments    Pt did well with PT session and was able to perform bed mobility and some in-hall ambulation w/o direct assist. Pt with some pain hesitancy and guarding t/o the session but ultimately functional and showed good effort t/o activities.  He was able to do SLRs and has some quad control, but does lack full extension (lacking about 2 degrees with gentle overpressure stretches).    Follow Up Recommendations  Home health PT     Equipment Recommendations  Rolling walker with 5" wheels    Recommendations for Other Services       Precautions / Restrictions Precautions Precautions: Knee;Fall Restrictions Weight Bearing Restrictions: Yes LLE Weight Bearing: Partial weight bearing LLE Partial Weight Bearing Percentage or Pounds: 50%    Mobility  Bed Mobility Overal bed mobility: Modified Independent Bed Mobility: Sit to Supine     Supine to sit: Min guard Sit to supine: Min guard   General bed mobility comments: Pt was able to get to EOB w/o direct assist  Transfers Overall transfer level: Needs assistance Equipment used: Rolling walker (2 wheeled) Transfers: Sit to/from Stand Sit to Stand: Min guard         General transfer comment: Pt able to rise w/o direct assist from slightly elevated bed height.  Repeated cuing for hand placement and set up.  Ambulation/Gait Ambulation/Gait assistance: Min guard Ambulation Distance (Feet): 65 Feet Assistive device: Rolling walker (2 wheeled) Gait Pattern/deviations: Step-to pattern   Gait velocity interpretation: <1.8 ft/sec, indicate of risk for recurrent falls General Gait Details: PT initially with very hesitant steps-to cadence but with cuing and gait training assist was able to  increase speed/cadence while still maintaining PWBing.              Wheelchair Mobility    Modified Rankin (Stroke Patients Only)       Balance Overall balance assessment: Needs assistance Sitting-balance support: Feet supported Sitting balance-Leahy Scale: Normal     Standing balance support: Bilateral upper extremity supported Standing balance-Leahy Scale: Fair Standing balance comment: heavy reliance on walker, at times gets too close during gait which affects balance.                            Cognition Arousal/Alertness: Awake/alert Behavior During Therapy: WFL for tasks assessed/performed Overall Cognitive Status: Within Functional Limits for tasks assessed                                        Exercises Total Joint Exercises Ankle Circles/Pumps: AROM;10 reps Quad Sets: Strengthening;10 reps Gluteal Sets: Strengthening;10 reps Short Arc Quad: Strengthening;AROM;10 reps Heel Slides: Strengthening;10 reps Hip ABduction/ADduction: Strengthening;10 reps Straight Leg Raises: AROM;10 reps Knee Flexion: 5 reps;PROM Goniometric ROM: 2-94    General Comments        Pertinent Vitals/Pain Pain Assessment: 0-10 Pain Score: 6  Pain Location: L knee Pain Intervention(s): Limited activity within patient's tolerance;Premedicated before session;Ice applied    Home Living                      Prior Function  PT Goals (current goals can now be found in the care plan section) Progress towards PT goals: Progressing toward goals    Frequency    BID      PT Plan Current plan remains appropriate    Co-evaluation              AM-PAC PT "6 Clicks" Daily Activity  Outcome Measure  Difficulty turning over in bed (including adjusting bedclothes, sheets and blankets)?: None Difficulty moving from lying on back to sitting on the side of the bed? : None Difficulty sitting down on and standing up from a chair  with arms (e.g., wheelchair, bedside commode, etc,.)?: Unable Help needed moving to and from a bed to chair (including a wheelchair)?: A Little Help needed walking in hospital room?: A Little Help needed climbing 3-5 steps with a railing? : A Little 6 Click Score: 18    End of Session Equipment Utilized During Treatment: Gait belt Activity Tolerance: Patient tolerated treatment well Patient left: in bed;with bed alarm set;with call bell/phone within reach;with family/visitor present;with nursing/sitter in room   PT Visit Diagnosis: Muscle weakness (generalized) (M62.81);Pain Pain - Right/Left: Left Pain - part of body: Knee     Time: [phone] PT Time Calculation (min) (ACUTE ONLY): 40 min  Charges:  $Gait Training: 8-22 mins $Therapeutic Exercise: 23-37 mins                    G Codes:       Erik Collins, DPT 07/09/2017, 1:48 PM

## 2017-07-09 NOTE — Progress Notes (Signed)
Notified Dr. Sabra Heck that patient was having some bradycardia. Order received for medical consult and telemetry

## 2017-07-10 ENCOUNTER — Inpatient Hospital Stay
Admission: RE | Admit: 2017-07-10 | Discharge: 2017-07-10 | Disposition: A | Discharge status: Home / Self Care | Payer: Medicare Other | Source: Ambulatory Visit | Attending: Internal Medicine | Admitting: Internal Medicine

## 2017-07-10 ENCOUNTER — Encounter
Admission: RE | Disposition: A | Discharge status: Home Health | Payer: Self-pay | Source: Home / Self Care | Attending: Specialist

## 2017-07-10 ENCOUNTER — Inpatient Hospital Stay: Payer: Medicare Other

## 2017-07-10 ENCOUNTER — Encounter: Payer: Self-pay | Admitting: Cardiology

## 2017-07-10 DIAGNOSIS — R001 Bradycardia, unspecified: Secondary | ICD-10-CM

## 2017-07-10 HISTORY — PX: TEMPORARY PACEMAKER: CATH118268

## 2017-07-10 LAB — URINALYSIS, ROUTINE W REFLEX MICROSCOPIC
Bacteria, UA: NONE SEEN
Bilirubin Urine: NEGATIVE
Glucose, UA: >=500 mg/dL — AB
Ketones, ur: NEGATIVE mg/dL
Leukocytes, UA: NEGATIVE
Nitrite: NEGATIVE
Protein, ur: NEGATIVE mg/dL
Specific Gravity, Urine: 1.006 (ref 1.005–1.030)
pH: 5 (ref 5.0–8.0)

## 2017-07-10 LAB — GLUCOSE, CAPILLARY
Glucose-Capillary: 139 mg/dL — ABNORMAL HIGH (ref 65–99)
Glucose-Capillary: 116 mg/dL — ABNORMAL HIGH (ref 65–99)
Glucose-Capillary: 131 mg/dL — ABNORMAL HIGH (ref 65–99)
Glucose-Capillary: 110 mg/dL — ABNORMAL HIGH (ref 65–99)
Glucose-Capillary: 138 mg/dL — ABNORMAL HIGH (ref 65–99)

## 2017-07-10 LAB — CBC
HCT: 31 % — ABNORMAL LOW (ref 40.0–52.0)
Hemoglobin: 10.5 g/dL — ABNORMAL LOW (ref 13.0–18.0)
MCH: 30.2 pg (ref 26.0–34.0)
MCHC: 33.8 g/dL (ref 32.0–36.0)
MCV: 89.3 fL (ref 80.0–100.0)
Platelets: 149 10*3/uL — ABNORMAL LOW (ref 150–440)
RBC: 3.48 MIL/uL — ABNORMAL LOW (ref 4.40–5.90)
RDW: 14.4 % (ref 11.5–14.5)
WBC: 8.6 10*3/uL (ref 3.8–10.6)

## 2017-07-10 LAB — ECHOCARDIOGRAM COMPLETE
Height: 71 in
Weight: 3024 oz

## 2017-07-10 LAB — TSH: TSH: 5.13 u[IU]/mL — ABNORMAL HIGH (ref 0.350–4.500)

## 2017-07-10 LAB — T4, FREE: Free T4: 0.92 ng/dL (ref 0.82–1.77)

## 2017-07-10 SURGERY — TEMPORARY PACEMAKER
Anesthesia: Moderate Sedation | Laterality: Right

## 2017-07-10 MED ORDER — SODIUM CHLORIDE 0.9 % IV SOLN
INTRAVENOUS | Status: DC
  Administered 2017-07-10: 11:00:00 via INTRAVENOUS

## 2017-07-10 MED ORDER — CEFAZOLIN SODIUM-DEXTROSE 2-4 GM/100ML-% IV SOLN
2.0000 g | INTRAVENOUS | Status: DC
  Filled 2017-07-10: qty 100

## 2017-07-10 MED ORDER — THIAMINE HCL 100 MG/ML IJ SOLN
100.0000 mg | Freq: Every day | INTRAMUSCULAR | Status: DC
  Administered 2017-07-10 – 2017-07-12 (×3): 100 mg via INTRAVENOUS
  Filled 2017-07-10 (×3): qty 2

## 2017-07-10 MED ORDER — ACETAMINOPHEN 325 MG PO TABS
650.0000 mg | ORAL_TABLET | Freq: Four times a day (QID) | ORAL | Status: DC | PRN
  Administered 2017-07-10 – 2017-07-11 (×4): 650 mg via ORAL
  Filled 2017-07-10 (×4): qty 2

## 2017-07-10 MED ORDER — LIDOCAINE HCL (PF) 1 % IJ SOLN
INTRAMUSCULAR | Status: AC
Start: 2017-07-10 — End: ?
  Filled 2017-07-10: qty 30

## 2017-07-10 MED ORDER — SODIUM CHLORIDE 0.9 % IV SOLN
80.0000 mg | INTRAVENOUS | Status: DC
  Filled 2017-07-10: qty 2

## 2017-07-10 SURGICAL SUPPLY — 8 items
CABLE ADAPT CONN TEMP 6FT (ADAPTER) ×3 IMPLANT
GUIDEWIRE EMER 3M J .025X150CM (WIRE) ×3 IMPLANT
NEEDLE PERC 18GX7CM (NEEDLE) ×3 IMPLANT
PACK CARDIAC CATH (CUSTOM PROCEDURE TRAY) ×3 IMPLANT
SHEATH AVANTI 6FR X 11CM (SHEATH) ×3 IMPLANT
SLEEVE REPOSITIONING LENGTH 30 (MISCELLANEOUS) ×6 IMPLANT
SUT SILK 0 FSL (SUTURE) ×3 IMPLANT
WIRE PACING TEMP ST TIP 5 (CATHETERS) ×3 IMPLANT

## 2017-07-10 NOTE — Progress Notes (Signed)
Spoke with Dr Jodell Cipro on phone and given bladder scan results. Order for in/ouit cath obtained.

## 2017-07-10 NOTE — Progress Notes (Signed)
*  PRELIMINARY RESULTS* Echocardiogram 2D Echocardiogram has been performed.  Sherrie Sport 07/10/2017, 9:05 AM

## 2017-07-10 NOTE — Progress Notes (Signed)
Report from bedside RN that patient had eaten breakfast this am including omlette, notified Dr Saralyn Pilar with orders to proceed with procedure.

## 2017-07-10 NOTE — Progress Notes (Signed)
Pt bladder scanned again; pt has only had 125cc voided this shift. Scanned for >900cc. hopitalist paged.

## 2017-07-10 NOTE — Consult Note (Signed)
[facility] Consultation      Name: Erik Collins MRN: [id number] DOB: 23-May-1945    ADMISSION DATE:  07/08/2017 CONSULTATION DATE:  07/10/2017  REFERRING MD :  Dr. Sabra Heck   CHIEF COMPLAINT:     Bradycardia   HISTORY OF PRESENT ILLNESS   Erik Collins is an 72 y.o. gentleman with history of COPD, CHF, coronary artery disease status post CABG, lung cancer status post lobectomy, COPD amongst other  medical problems.  He was admitted for left total knee arthroplasty by Dr. Sabra Heck.  Hospitalist consulted for bradycardia.  Patient is a symptomatic.  Denies any dizziness or palpitations or chest pain resting comfortably during my examination.   He arrived in the ICU with Right IJ transvenous temporary pacemaker.     SIGNIFICANT EVENTS   5/22 Left total knee arthroplasty 5/24 transferred to ICU because of asymptomatic bradycardia    PAST MEDICAL HISTORY     Past Medical History:  Diagnosis Date  . Arthritis   . Cancer ([place])    hx lung cancer  . CHF (congestive heart failure) ([place])   . COPD (chronic obstructive pulmonary disease) ([place])   . Coronary artery disease    s/p CABG  . Diabetes mellitus without complication ([place])   . GERD (gastroesophageal reflux disease)   . Hypercholesterolemia   . Hypertension   . Lung cancer ([place])   . Myocardial infarction [facility])    09/2009 f/up with Dr. Neoma Laming  . Sleep apnea    Past Surgical History:  Procedure Laterality Date  . BACK SURGERY    . COLONOSCOPY WITH PROPOFOL N/A 11/09/2014   Procedure: COLONOSCOPY WITH PROPOFOL;  Surgeon: Josefine Class, MD;  Location: [facility] ENDOSCOPY;  Service: Endoscopy;  Laterality: N/A;  . CORONARY ANGIOPLASTY    . CORONARY ARTERY BYPASS GRAFT  09/2009   quadruple, done at [facility]  . ESOPHAGOGASTRODUODENOSCOPY (EGD) WITH PROPOFOL N/A 11/09/2014   Procedure: ESOPHAGOGASTRODUODENOSCOPY (EGD) WITH PROPOFOL;  Surgeon: Josefine Class, MD;  Location: [facility] ENDOSCOPY;  Service:  Endoscopy;  Laterality: N/A;  . left lower lobectomy  09/2009   at [facility]  . LUMBAR LAMINECTOMY/DECOMPRESSION MICRODISCECTOMY  12/17/2011   Procedure: LUMBAR LAMINECTOMY/DECOMPRESSION MICRODISCECTOMY 1 LEVEL;  Surgeon: Eustace Moore, MD;  Location: [place] NEURO ORS;  Service: Neurosurgery;  Laterality: Right;  Right Lumbar five-sacral one microdiscectomy  . TEMPORARY PACEMAKER Right 07/10/2017   Procedure: TEMPORARY PACEMAKER;  Surgeon: Isaias Cowman, MD;  Location: [place] CV LAB;  Service: Cardiovascular;  Laterality: Right;  . TOTAL KNEE ARTHROPLASTY Left 07/08/2017   Procedure: TOTAL KNEE ARTHROPLASTY;  Surgeon: Earnestine Leys, MD;  Location: [facility] ORS;  Service: Orthopedics;  Laterality: Left;   Prior to Admission medications   Medication Sig Start Date End Date Taking? Authorizing Provider  acetaminophen (TYLENOL 8 HOUR ARTHRITIS PAIN) 650 MG CR tablet Take 1,300 mg by mouth 2 (two) times daily.   Yes [provider]  aspirin EC 81 MG tablet Take 81 mg by mouth daily.   Yes [provider]  carvedilol (COREG) 25 MG tablet Take 25 mg by mouth 2 (two) times daily.    Yes [provider]  clonazePAM (KLONOPIN) 1 MG tablet Take 1 mg by mouth at bedtime. For sleep   Yes [provider]  empagliflozin (JARDIANCE) 10 MG TABS tablet Take 10 mg by mouth daily.   Yes [provider]  ezetimibe (ZETIA) 10 MG tablet Take 10 mg by mouth daily.   Yes [provider]  Insulin Glargine (TOUJEO MAX SOLOSTAR) 300 UNIT/ML SOPN Inject 0-40 Units into the skin See admin instructions. Sliding scale insulin per patient  3 units if blood sugar is greater than 150 during the day, if blood sugars are less= no insulin 40 units if blood sugar is greater than 150 at night, if blood sugars are less=no insulin   Yes [provider]  metFORMIN (GLUCOPHAGE) 1000 MG tablet Take 1,000 mg by mouth 2 (two) times daily.   Yes [provider]  Omega-3  Fatty Acids (FISH OIL) 1200 MG CAPS Take 1,200 mg by mouth 2 (two) times daily.   Yes [provider]  omeprazole (PRILOSEC) 20 MG capsule Take 20 mg by mouth daily.   Yes [provider]  tiZANidine (ZANAFLEX) 2 MG tablet Take 4 mg by mouth at bedtime.   Yes [provider]  traMADol (ULTRAM) 50 MG tablet Take 50 mg by mouth at bedtime. For pain   Yes [provider]  vitamin B-12 (CYANOCOBALAMIN) 500 MCG tablet Take 500 mcg by mouth daily.   Yes [provider]   No Known Allergies   FAMILY HISTORY   Family History  Problem Relation Age of Onset  . Heart disease Mother   . Diabetes Mother   . Heart disease Father   . Stroke Father   . Heart attack Father       SOCIAL HISTORY    reports that he quit smoking about 7 years ago. He has never used smokeless tobacco. He reports that he does not drink alcohol or use drugs.  Review of Systems  Constitutional: Negative for fever, chills weight loss HENT: Negative for ear pain, nosebleeds, congestion, facial swelling, rhinorrhea, neck pain, neck stiffness and ear discharge.   Respiratory: Negative for cough, shortness of breath, wheezing  Cardiovascular: Negative for chest pain, palpitations and leg swelling.  + DIZZY Gastrointestinal: Negative for heartburn, abdominal pain, vomiting, diarrhea or consitpation Genitourinary: Negative for dysuria, urgency, frequency, hematuria Musculoskeletal: Negative for back pain or joint pain Neurological: Negative for dizziness, seizures, syncope, focal weakness,  numbness and headaches.  Hematological: Does not bruise/bleed easily.  Psychiatric/Behavioral: Negative for hallucinations, confusion, dysphoric mood      VITAL SIGNS    Temp:  [98 F (36.7 C)-100.2 F (37.9 C)] 98 F (36.7 C) (05/24 1600) Pulse Rate:  [38-92] 61 (05/24 1600) Resp:  [11-20] 16 (05/24 1600) BP: (106-126)/(53-90) 126/66 (05/24 1600) SpO2:  [88 %-100 %] 92 % (05/24  1600) Weight:  [189 lb (85.7 kg)-198 lb 10.2 oz (90.1 kg)] 198 lb 10.2 oz (90.1 kg) (05/24 1240) HEMODYNAMICS:  no compromise RA oxygen   INTAKE / OUTPUT:  Intake/Output Summary (Last 24 hours) at 07/10/2017 1717 Last data filed at 07/10/2017 1600 Gross per 24 hour  Intake 3222.33 ml  Output 2200 ml  Net 1022.33 ml       PHYSICAL EXAM    Constitutional: Appears well-developed and well-nourished. No distress. HENT: Normocephalic. [name] Oropharynx is clear and moist.  Eyes: Conjunctivae and EOM are normal. PERRLA, no scleral icterus.  Neck: Normal ROM. Neck supple. No JVD. No tracheal deviation. CVS: Bradycardic S1/S2 +, no murmurs, no gallops, no carotid bruit.  Pulmonary: Effort and breath sounds normal, no stridor, rhonchi, wheezes, rales.  Abdominal: Soft. BS +,  no distension, tenderness, rebound or guarding.  Musculoskeletal: Normal range of motion. No edema and no tenderness.  Neuro: Alert. CN 2-12 grossly intact. No focal deficits. Skin: Skin is warm and dry. No rash  noted.Left knee dressing in place Psychiatric: Normal mood and affect.       LABS   LABS:  CBC Recent Labs  Lab 07/08/17 1304 07/09/17 0335 07/10/17 0613  WBC 7.8 7.7 8.6  HGB 11.5* 10.7* 10.5*  HCT 34.3* 31.7* 31.0*  PLT 182 167 149*   Coag's No results for input(s): APTT, INR in the last 168 hours. BMET Recent Labs  Lab 07/08/17 1304 07/09/17 0335  NA  --  137  K  --  4.5  CL  --  105  CO2  --  28  BUN  --  21*  CREATININE 0.93 1.03  GLUCOSE  --  116*   Electrolytes Recent Labs  Lab 07/09/17 0335  CALCIUM 8.4*   Sepsis Markers No results for input(s): LATICACIDVEN, PROCALCITON, O2SATVEN in the last 168 hours. ABG No results for input(s): PHART, PCO2ART, PO2ART in the last 168 hours. Liver Enzymes No results for input(s): AST, ALT, ALKPHOS, BILITOT, ALBUMIN in the last 168 hours. Cardiac Enzymes No results for input(s): TROPONINI, PROBNP in the last 168  hours. Glucose Recent Labs  Lab 07/09/17 0744 07/09/17 1143 07/09/17 1640 07/10/17 0739 07/10/17 1240 07/10/17 1601  GLUCAP 113* 122* 131* 139* 116* 131*     No results found for this or any previous visit (from the past 240 hour(s)).   Current Facility-Administered Medications:  .  0.45 % sodium chloride infusion, , Intravenous, Continuous, Earnestine Leys, MD, Stopped at 07/10/17 1200 .  acetaminophen (TYLENOL) tablet 650 mg, 650 mg, Oral, Q6H PRN, Bettey Costa, MD, 650 mg at 07/10/17 0823 .  albuterol (PROVENTIL) (2.5 MG/3ML) 0.083% nebulizer solution 2.5 mg, 2.5 mg, Nebulization, Q4H PRN, Gouru, Aruna, MD .  alum & mag hydroxide-simeth (MAALOX/MYLANTA) 200-200-20 MG/5ML suspension 30 mL, 30 mL, Oral, Q4H PRN, Earnestine Leys, MD .  bisacodyl (DULCOLAX) suppository 10 mg, 10 mg, Rectal, Daily PRN, Earnestine Leys, MD .  clonazePAM Bobbye Charleston) tablet 1 mg, 1 mg, Oral, Tora Duck, MD, 1 mg at 07/09/17 2255 .  diphenhydrAMINE (BENADRYL) 12.5 MG/5ML elixir 12.5-25 mg, 12.5-25 mg, Oral, Q4H PRN, Earnestine Leys, MD .  docusate sodium (COLACE) capsule 100 mg, 100 mg, Oral, BID, Earnestine Leys, MD, 100 mg at 07/10/17 1605 .  empagliflozin (JARDIANCE) tablet 10 mg, 10 mg, Oral, Daily, Earnestine Leys, MD, 10 mg at 07/09/17 1227 .  enoxaparin (LOVENOX) injection 40 mg, 40 mg, Subcutaneous, Q24H, Maccia, Melissa D, RPH, 40 mg at 07/10/17 0811 .  ezetimibe (ZETIA) tablet 10 mg, 10 mg, Oral, Daily, Earnestine Leys, MD, 10 mg at 07/10/17 1606 .  ferrous sulfate tablet 325 mg, 325 mg, Oral, TID Loni Dolly, MD, 325 mg at 07/10/17 1605 .  insulin aspart (novoLOG) injection 0-15 Units, 0-15 Units, Subcutaneous, TID WC, Earnestine Leys, MD, 2 Units at 07/10/17 1605 .  insulin glargine (LANTUS) injection 0-40 Units, 0-40 Units, Subcutaneous, See admin instructions, Earnestine Leys, MD .  ipratropium-albuterol (DUONEB) 0.5-2.5 (3) MG/3ML nebulizer solution 3 mL, 3 mL, Nebulization, Q6H, Gouru,  Aruna, MD, 3 mL at 07/10/17 0716 .  magnesium hydroxide (MILK OF MAGNESIA) suspension 30 mL, 30 mL, Oral, Daily PRN, Earnestine Leys, MD .  menthol-cetylpyridinium (CEPACOL) lozenge 3 mg, 1 lozenge, Oral, PRN **OR** phenol (CHLORASEPTIC) mouth spray 1 spray, 1 spray, Mouth/Throat, PRN, Earnestine Leys, MD .  methocarbamol (ROBAXIN) tablet 500 mg, 500 mg, Oral, Q6H PRN, 500 mg at 07/09/17 1106 **OR** methocarbamol (ROBAXIN) 500 mg in dextrose 5 % 50 mL IVPB, 500 mg, Intravenous, Q6H PRN, Sabra Heck,  Nadara Mustard, MD .  metoCLOPramide (REGLAN) tablet 5-10 mg, 5-10 mg, Oral, Q8H PRN **OR** metoCLOPramide (REGLAN) injection 5-10 mg, 5-10 mg, Intravenous, Q8H PRN, Earnestine Leys, MD .  morphine 2 MG/ML injection 1 mg, 1 mg, Intravenous, Q2H PRN, Earnestine Leys, MD, 1 mg at 07/09/17 2007 .  ondansetron (ZOFRAN) tablet 4 mg, 4 mg, Oral, Q6H PRN **OR** ondansetron (ZOFRAN) injection 4 mg, 4 mg, Intravenous, Q6H PRN, Earnestine Leys, MD .  oxyCODONE (Oxy IR/ROXICODONE) immediate release tablet 5-10 mg, 5-10 mg, Oral, Q6H PRN, Gouru, Aruna, MD, 5 mg at 07/10/17 1325 .  pantoprazole (PROTONIX) EC tablet 40 mg, 40 mg, Oral, Daily, Earnestine Leys, MD, 40 mg at 07/10/17 1605 .  pneumococcal 23 valent vaccine (PNU-IMMUNE) injection 0.5 mL, 0.5 mL, Intramuscular, Tomorrow-1000, Earnestine Leys, MD .  sodium phosphate (FLEET) 7-19 GM/118ML enema 1 enema, 1 enema, Rectal, Once PRN, Earnestine Leys, MD .  tiZANidine (ZANAFLEX) tablet 4 mg, 4 mg, Oral, Tora Duck, MD, 4 mg at 07/09/17 2255 .  vitamin B-12 (CYANOCOBALAMIN) tablet 500 mcg, 500 mcg, Oral, Daily, Earnestine Leys, MD, 500 mcg at 07/10/17 1606 .  zolpidem (AMBIEN) tablet 5 mg, 5 mg, Oral, QHS PRN, Earnestine Leys, MD  IMAGING    Dg Chest 1 View  Result Date: 07/10/2017 CLINICAL DATA:  Pt with fever of 100.2 this morning. EXAM: CHEST  1 VIEW COMPARISON:  12/17/2011 FINDINGS: Chronic scarring at the left lung base. No new infiltrate or overt edema. Heart size and  mediastinal contours are within normal limits. Previous CABG. No effusion. Chronic blunting of the left lateral costophrenic angle. No pneumothorax. Previous median sternotomy. Probable left carotid bifurcation calcifications. IMPRESSION: Chronic and postop changes.  No acute findings. Electronically Signed   By: Lucrezia Europe M.D.   On: 07/10/2017 08:47      MAJOR EVENTS/TEST RESULTS: 5/24 bradycardia  INDWELLING DEVICES:: Peripheral IVs  MICRO DATA: MRSA PCR negative    ANTIMICROBIALS:  None   ASSESSMENT/PLAN    1.  Symptomatic bradycardia. Beta blocker has been discontinued Plan: S/P transvenous jugular temporary pacer in place.   2.  Hx of CAD  Had been on Coreg. Plan: continue hold of Coreg   3.  Chronic systolic heart failure ejection fraction 45% without signs of exacerbation  4.  Diabetes: Continue current regimen with sliding scale and ADA diet Plan: target Glucose management  5.  Left total knee arthroplasty: Management as per orthopedic surgery Plan: PT/ Management as per surgical team.  Thank you for allowing me the privileges to care for this patient.  Cammie Sickle, M.D

## 2017-07-10 NOTE — Progress Notes (Addendum)
Patient and family report "no void >12 hours". Bladder scanned patient with result of >510ml. Order from [facility] to I&O cath with success. Dr Benjie Karvonen at bedside during prep and notified of bladder scan.

## 2017-07-10 NOTE — Progress Notes (Signed)
PT Cancellation Note  Patient Details Name: Erik Collins MRN: [id number] DOB: [birth year]   Cancelled Treatment:    Reason Eval/Treat Not Completed: Patient at procedure or test/unavailable; Spoke to nursing regarding pt's recent HR in the 3s.  Per nursing pt currently at echo and has a cardio consult.  Will attempt to see pt later this date as medically appropriate.     Linus Salmons PT, DPT 07/10/17, 8:40 AM

## 2017-07-10 NOTE — Progress Notes (Signed)
PT Cancellation Note  Patient Details Name: Erik Collins MRN: [id number] DOB: Jan 04, 1946   Cancelled Treatment:    Reason Eval/Treat Not Completed: Medical issues which prohibited therapy; pt transferred to ICU and per nursing now has an external pacemaker.  ICU nursing notified that PT orders will be completed at this time and will see pt at a future date once pt is deemed medically appropriate for PT services and upon receipt of new PT orders.   Linus Salmons PT, DPT 07/10/17, 2:25 PM

## 2017-07-10 NOTE — Progress Notes (Signed)
Pt bladder scanned for 706cc. Pt encouraged to use urinal to void. Family member at bedside. Call bell in reach.

## 2017-07-10 NOTE — Consult Note (Signed)
Erik Collins is a 72 y.o. male  [id number]  Primary Cardiologist:Alyze Lauf Rosezetta Schlatter for Consultation: Sinus bradycardia  HPI: 37 YOWM had Knee surgery and developed sinus braddycardia as low as 35. He is lethargic.   Review of Systems: No chest pain orr SOB   Past Medical History:  Diagnosis Date  . Arthritis   . Cancer ([place])    hx lung cancer  . CHF (congestive heart failure) ([place])   . COPD (chronic obstructive pulmonary disease) ([place])   . Coronary artery disease    s/p CABG  . Diabetes mellitus without complication ([place])   . GERD (gastroesophageal reflux disease)   . Hypercholesterolemia   . Hypertension   . Lung cancer ([place])   . Myocardial infarction [facility])    09/2009 f/up with Dr. Neoma Laming  . Sleep apnea     Medications Prior to Admission  Medication Sig Dispense Refill  . acetaminophen (TYLENOL 8 HOUR ARTHRITIS PAIN) 650 MG CR tablet Take 1,300 mg by mouth 2 (two) times daily.    [name] aspirin EC 81 MG tablet Take 81 mg by mouth daily.    . carvedilol (COREG) 25 MG tablet Take 25 mg by mouth 2 (two) times daily.     . clonazePAM (KLONOPIN) 1 MG tablet Take 1 mg by mouth at bedtime. For sleep    . empagliflozin (JARDIANCE) 10 MG TABS tablet Take 10 mg by mouth daily.    [name] ezetimibe (ZETIA) 10 MG tablet Take 10 mg by mouth daily.    . Insulin Glargine (TOUJEO MAX SOLOSTAR) 300 UNIT/ML SOPN Inject 0-40 Units into the skin See admin instructions. Sliding scale insulin per patient  3 units if blood sugar is greater than 150 during the day, if blood sugars are less= no insulin 40 units if blood sugar is greater than 150 at night, if blood sugars are less=no insulin    . metFORMIN (GLUCOPHAGE) 1000 MG tablet Take 1,000 mg by mouth 2 (two) times daily.    . Omega-3 Fatty Acids (FISH OIL) 1200 MG CAPS Take 1,200 mg by mouth 2 (two) times daily.    [name] omeprazole (PRILOSEC) 20 MG capsule Take 20 mg by mouth daily.    [name] tiZANidine (ZANAFLEX) 2 MG tablet Take 4 mg by mouth at  bedtime.    . traMADol (ULTRAM) 50 MG tablet Take 50 mg by mouth at bedtime. For pain    . vitamin B-12 (CYANOCOBALAMIN) 500 MCG tablet Take 500 mcg by mouth daily.       . clonazePAM  1 mg Oral QHS  . docusate sodium  100 mg Oral BID  . empagliflozin  10 mg Oral Daily  . enoxaparin (LOVENOX) injection  40 mg Subcutaneous Q24H  . ezetimibe  10 mg Oral Daily  . ferrous sulfate  325 mg Oral TID PC  . insulin aspart  0-15 Units Subcutaneous TID WC  . insulin glargine  0-40 Units Subcutaneous See admin instructions  . ipratropium-albuterol  3 mL Nebulization Q6H  . metFORMIN  1,000 mg Oral BID WC  . pantoprazole  40 mg Oral Daily  . pneumococcal 23 valent vaccine  0.5 mL Intramuscular Tomorrow-1000  . tiZANidine  4 mg Oral QHS  . vitamin B-12  500 mcg Oral Daily    Infusions: . sodium chloride 100 mL/hr at 07/09/17 2255  . methocarbamol (ROBAXIN)  IV      No Known Allergies  Social History   Socioeconomic History  . Marital status: Married  Spouse name: Not on file  . Number of children: Not on file  . Years of education: Not on file  . Highest education level: Not on file  Occupational History  . Not on file  Social Needs  . Financial resource strain: Not on file  . Food insecurity:    Worry: Not on file    Inability: Not on file  . Transportation needs:    Medical: Not on file    Non-medical: Not on file  Tobacco Use  . Smoking status: Former Smoker    Last attempt to quit: 09/19/2009    Years since quitting: 7.8  . Smokeless tobacco: Never Used  Substance and Sexual Activity  . Alcohol use: No  . Drug use: No  . Sexual activity: Not on file  Lifestyle  . Physical activity:    Days per week: Not on file    Minutes per session: Not on file  . Stress: Not on file  Relationships  . Social connections:    Talks on phone: Not on file    Gets together: Not on file    Attends religious service: Not on file    Active member of club or organization: Not on file     Attends meetings of clubs or organizations: Not on file    Relationship status: Not on file  . Intimate partner violence:    Fear of current or ex partner: Not on file    Emotionally abused: Not on file    Physically abused: Not on file    Forced sexual activity: Not on file  Other Topics Concern  . Not on file  Social History Narrative  . Not on file    Family History  Problem Relation Age of Onset  . Heart disease Mother   . Diabetes Mother   . Heart disease Father   . Stroke Father   . Heart attack Father     PHYSICAL EXAM: Vitals:   07/10/17 0718 07/10/17 0736  BP:  116/61  Pulse:  (!) 44  Resp:    Temp:  100.2 F (37.9 C)  SpO2: 91% 94%     Intake/Output Summary (Last 24 hours) at 07/10/2017 0928 Last data filed at 07/10/2017 0600 Gross per 24 hour  Intake 3460 ml  Output 1725 ml  Net 1735 ml    General:  Well appearing. No respiratory difficulty HEENT: normal Neck: supple. no JVD. Carotids 2+ bilat; no bruits. No lymphadenopathy or thryomegaly appreciated. Cor: PMI nondisplaced. Regular rate & rhythm. No rubs, gallops or murmurs. Lungs: clear Abdomen: soft, nontender, nondistended. No hepatosplenomegaly. No bruits or masses. Good bowel sounds. Extremities: no cyanosis, clubbing, rash, edema Neuro: alert & oriented x 3, cranial nerves grossly intact. moves all 4 extremities w/o difficulty. Affect pleasant.  ECG: sinus brady 38, ekg pending  Results for orders placed or performed during the hospital encounter of 07/08/17 (from the past 24 hour(s))  Glucose, capillary     Status: Abnormal   Collection Time: 07/09/17 11:43 AM  Result Value Ref Range   Glucose-Capillary 122 (H) 65 - 99 mg/dL  Glucose, capillary     Status: Abnormal   Collection Time: 07/09/17  4:40 PM  Result Value Ref Range   Glucose-Capillary 131 (H) 65 - 99 mg/dL  CBC     Status: Abnormal   Collection Time: 07/10/17  6:13 AM  Result Value Ref Range   WBC 8.6 3.8 - 10.6 K/uL    RBC 3.48 (L) 4.40 -  5.90 MIL/uL   Hemoglobin 10.5 (L) 13.0 - 18.0 g/dL   HCT 31.0 (L) 40.0 - 52.0 %   MCV 89.3 80.0 - 100.0 fL   MCH 30.2 26.0 - 34.0 pg   MCHC 33.8 32.0 - 36.0 g/dL   RDW 14.4 11.5 - 14.5 %   Platelets 149 (L) 150 - 440 K/uL  TSH     Status: Abnormal   Collection Time: 07/10/17  6:13 AM  Result Value Ref Range   TSH 5.130 (H) 0.350 - 4.500 uIU/mL  T4, free     Status: None   Collection Time: 07/10/17  6:13 AM  Result Value Ref Range   Free T4 0.92 0.82 - 1.77 ng/dL  Glucose, capillary     Status: Abnormal   Collection Time: 07/10/17  7:39 AM  Result Value Ref Range   Glucose-Capillary 139 (H) 65 - 99 mg/dL   Dg Chest 1 View  Result Date: 07/10/2017 CLINICAL DATA:  Pt with fever of 100.2 this morning. EXAM: CHEST  1 VIEW COMPARISON:  12/17/2011 FINDINGS: Chronic scarring at the left lung base. No new infiltrate or overt edema. Heart size and mediastinal contours are within normal limits. Previous CABG. No effusion. Chronic blunting of the left lateral costophrenic angle. No pneumothorax. Previous median sternotomy. Probable left carotid bifurcation calcifications. IMPRESSION: Chronic and postop changes.  No acute findings. Electronically Signed   By: Lucrezia Europe M.D.   On: 07/10/2017 08:47   Dg Knee Left Port  Result Date: 07/08/2017 CLINICAL DATA:  Postop left total knee arthroplasty. EXAM: PORTABLE LEFT KNEE - 1-2 VIEW COMPARISON:  None FINDINGS: The hardware components of a left total knee arthroplasty device are identified. No periprosthetic fracture noted. Two surgical drains overlie the distal femur. Gas noted within the surrounding soft tissues. Anterior midline skin staples noted. IMPRESSION: 1. Status post left total knee arthroplasty. 2. No complications. Electronically Signed   By: Kerby Moors M.D.   On: 07/08/2017 11:13     ASSESSMENT AND PLAN: Sinus bradycardia, was on coreg 25 bid and had normal HR prior to Surgery. CCTA showed ca score 1891, LIMA to LAD  and SVG to OM PDA were patent. Coreg is hold and eccho done today has LVEF 47-53 %, mild to moderate MT, mildd TR, PR. Called DR. Parachoe to evaluate for PPM and spoke to him Keep coreg on hold.  Pearl Berlinger A

## 2017-07-10 NOTE — Progress Notes (Signed)
Subjective: Day of Surgery Procedure(s) (LRB): TEMPORARY PACEMAKER (Right)   Patient's left knee is doing fairly well.  Unable to progress with PT due to his cardiac issues.  He has had an external pacemaker inserted and is now in the ICU for observation.  Patient reports pain as moderate.  Objective:   VITALS:   Vitals:   07/10/17 1200 07/10/17 1240  BP:  124/63  Pulse:  71  Resp:  19  Temp: 99.2 F (37.3 C) 99.2 F (37.3 C)  SpO2:  91%    Neurologically intact ABD soft Neurovascular intact Sensation intact distally Intact pulses distally Dorsiflexion/Plantar flexion intact Incision: dressing C/D/I  LABS Recent Labs    07/08/17 1304 07/09/17 0335 07/10/17 0613  HGB 11.5* 10.7* 10.5*  HCT 34.3* 31.7* 31.0*  WBC 7.8 7.7 8.6  PLT 182 167 149*    Recent Labs    07/08/17 1304 07/09/17 0335  NA  --  137  K  --  4.5  BUN  --  21*  CREATININE 0.93 1.03  GLUCOSE  --  116*    No results for input(s): LABPT, INR in the last 72 hours.   Assessment/Plan: Day of Surgery Procedure(s) (LRB): TEMPORARY PACEMAKER (Right)   Advance diet Up with therapy when allowed by the cardiology service.  Patient should take enteric-coated aspirin 3 and 25 mg twice daily for 6 weeks on discharge.  Patient has an appointment to return to my office in about 10 days.  He has an appointment to start outpatient PT at my office next week.

## 2017-07-10 NOTE — Progress Notes (Signed)
Patient's Temp 100.2 this am. HR 44. MD Mody paged and received verbal orders for tylenol PRN.

## 2017-07-10 NOTE — Progress Notes (Signed)
Pt fell back to sleep and did not void. Assisted pt to side of bed to use urinal. Voided 125 cc. Back in bed.

## 2017-07-10 NOTE — Progress Notes (Signed)
Pt straight cathed, after explanation of procedure and pt agreement,  for 1000cc clear amber urine. Pt tolerated without event.

## 2017-07-10 NOTE — Progress Notes (Signed)
Discussed with Parachoe, and will place temporary PM.

## 2017-07-10 NOTE — Progress Notes (Signed)
[facility] [place] NAME: Erik Collins    MR#:  [id number]  DATE OF BIRTH:  Jul 28, 1945  SUBJECTIVE:  Patient seen in specials going for pacemaker  REVIEW OF SYSTEMS:    Review of Systems  Constitutional: Negative for fever, chills weight loss HENT: Negative for ear pain, nosebleeds, congestion, facial swelling, rhinorrhea, neck pain, neck stiffness and ear discharge.   Respiratory: Negative for cough, shortness of breath, wheezing  Cardiovascular: Negative for chest pain, palpitations and leg swelling.  + DIZZY Gastrointestinal: Negative for heartburn, abdominal pain, vomiting, diarrhea or consitpation Genitourinary: Negative for dysuria, urgency, frequency, hematuria Musculoskeletal: Negative for back pain or joint pain Neurological: Negative for dizziness, seizures, syncope, focal weakness,  numbness and headaches.  Hematological: Does not bruise/bleed easily.  Psychiatric/Behavioral: Negative for hallucinations, confusion, dysphoric mood    Tolerating Diet:NPO      DRUG ALLERGIES:  No Known Allergies  VITALS:  Blood pressure 106/61, pulse (!) 38, temperature 98.9 F (37.2 C), temperature source Oral, resp. rate 20, height 5\' 11"  (1.803 m), weight 85.7 kg (189 lb), SpO2 (!) 88 %.  PHYSICAL EXAMINATION:  Constitutional: Appears well-developed and well-nourished. No distress. HENT: Normocephalic. [name] Oropharynx is clear and moist.  Eyes: Conjunctivae and EOM are normal. PERRLA, no scleral icterus.  Neck: Normal ROM. Neck supple. No JVD. No tracheal deviation. CVS: Bradycardic S1/S2 +, no murmurs, no gallops, no carotid bruit.  Pulmonary: Effort and breath sounds normal, no stridor, rhonchi, wheezes, rales.  Abdominal: Soft. BS +,  no distension, tenderness, rebound or guarding.  Musculoskeletal: Normal range of motion. No edema and no tenderness.  Neuro: Alert. CN 2-12 grossly intact. No focal deficits. Skin: Skin is warm and  dry. No rash noted. Psychiatric: Normal mood and affect.      LABORATORY PANEL:   CBC Recent Labs  Lab 07/10/17 0613  WBC 8.6  HGB 10.5*  HCT 31.0*  PLT 149*   ------------------------------------------------------------------------------------------------------------------  Chemistries  Recent Labs  Lab 07/09/17 0335  NA 137  K 4.5  CL 105  CO2 28  GLUCOSE 116*  BUN 21*  CREATININE 1.03  CALCIUM 8.4*   ------------------------------------------------------------------------------------------------------------------  Cardiac Enzymes No results for input(s): TROPONINI in the last 168 hours. ------------------------------------------------------------------------------------------------------------------  RADIOLOGY:  Dg Chest 1 View  Result Date: 07/10/2017 CLINICAL DATA:  Pt with fever of 100.2 this morning. EXAM: CHEST  1 VIEW COMPARISON:  12/17/2011 FINDINGS: Chronic scarring at the left lung base. No new infiltrate or overt edema. Heart size and mediastinal contours are within normal limits. Previous CABG. No effusion. Chronic blunting of the left lateral costophrenic angle. No pneumothorax. Previous median sternotomy. Probable left carotid bifurcation calcifications. IMPRESSION: Chronic and postop changes.  No acute findings. Electronically Signed   By: Lucrezia Europe M.D.   On: 07/10/2017 08:47     ASSESSMENT AND PLAN:   72 year old male with history of CAD/CABG, lung cancer status post lobectomy and COPD who had a left total knee arthroplasty and hospitalist was consulted for bradycardia.  1.  Symptomatic bradycardia: Patient is planned for pacemaker today.  2.  CAD: Evaluated cardiology Coreg on hold due to bradycardia 3.  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  Diabetes: Continue current regimen with sliding scale and ADA diet  5.  Hyperlipidemia: Continue Zetia  6.  COPD without signs of exacerbation  7.   Left total knee arthroplasty: Management as per orthopedic surgery  Management plans discussed with  the patient and family and they are in agreement.  CODE STATUS: Full  TOTAL TIME TAKING CARE OF THIS PATIENT: 30 minutes.     POSSIBLE D/C 1 to 2 days, DEPENDING ON CLINICAL CONDITION.   Erik Collins M.D on 07/10/2017 at 12:19 PM  Between 7am to 6pm - Pager - [phone] After 6pm go to [email or web address] - password [email or web address] [facility]  Office  [phone]  CC: Primary care physician; Perrin Maltese, MD  Note: This dictation was prepared with Dragon dictation along with smaller phrase technology. Any transcriptional errors that result from this process are unintentional.

## 2017-07-11 LAB — BASIC METABOLIC PANEL
Anion gap: 7 (ref 5–15)
BUN: 27 mg/dL — ABNORMAL HIGH (ref 6–20)
CO2: 26 mmol/L (ref 22–32)
Calcium: 8.6 mg/dL — ABNORMAL LOW (ref 8.9–10.3)
Chloride: 105 mmol/L (ref 101–111)
Creatinine, Ser: 1.19 mg/dL (ref 0.61–1.24)
GFR calc Af Amer: >60 mL/min (ref >60)
GFR calc non Af Amer: 59 mL/min — ABNORMAL LOW (ref >60)
Glucose, Bld: 130 mg/dL — ABNORMAL HIGH (ref 65–99)
Potassium: 4.4 mmol/L (ref 3.5–5.1)
Sodium: 138 mmol/L (ref 135–145)

## 2017-07-11 LAB — CBC
HCT: 28.9 % — ABNORMAL LOW (ref 40.0–52.0)
Hemoglobin: 9.8 g/dL — ABNORMAL LOW (ref 13.0–18.0)
MCH: 30.1 pg (ref 26.0–34.0)
MCHC: 34 g/dL (ref 32.0–36.0)
MCV: 88.4 fL (ref 80.0–100.0)
Platelets: 158 10*3/uL (ref 150–440)
RBC: 3.27 MIL/uL — ABNORMAL LOW (ref 4.40–5.90)
RDW: 14 % (ref 11.5–14.5)
WBC: 9.3 10*3/uL (ref 3.8–10.6)

## 2017-07-11 LAB — GLUCOSE, CAPILLARY
Glucose-Capillary: 119 mg/dL — ABNORMAL HIGH (ref 65–99)
Glucose-Capillary: 150 mg/dL — ABNORMAL HIGH (ref 65–99)
Glucose-Capillary: 124 mg/dL — ABNORMAL HIGH (ref 65–99)
Glucose-Capillary: 143 mg/dL — ABNORMAL HIGH (ref 65–99)
Glucose-Capillary: 180 mg/dL — ABNORMAL HIGH (ref 65–99)

## 2017-07-11 LAB — T3: T3, Total: 65 ng/dL — ABNORMAL LOW (ref 71–180)

## 2017-07-11 LAB — PHOSPHORUS: Phosphorus: 3.3 mg/dL (ref 2.5–4.6)

## 2017-07-11 LAB — MAGNESIUM: Magnesium: 1.7 mg/dL (ref 1.7–2.4)

## 2017-07-11 MED ORDER — IPRATROPIUM-ALBUTEROL 0.5-2.5 (3) MG/3ML IN SOLN: 3.0000 mL | Freq: Four times a day (QID) | RESPIRATORY_TRACT | Status: DC | PRN

## 2017-07-11 NOTE — Progress Notes (Signed)
Patient resting in bed. Polar care in place, dressing dry and intact. No complaints at this time, continue to monitor.

## 2017-07-11 NOTE — Progress Notes (Signed)
[facility] Consultation      Name: Erik Collins MRN: [id number] DOB: 29-May-1945    ADMISSION DATE:  07/08/2017 CONSULTATION DATE:  07/10/2017  REFERRING MD :  Dr. Sabra Heck   CHIEF COMPLAINT:     Bradycardia   HISTORY OF PRESENT ILLNESS   Erik Collins is an 72 y.o. gentleman with history of COPD, CHF, coronary artery disease status post CABG, lung cancer status post lobectomy, COPD amongst other  medical problems.  He was admitted for left total knee arthroplasty by Dr. Sabra Heck.  Hospitalist consulted for bradycardia.  Patient is a symptomatic.  Denies any dizziness or palpitations or chest pain resting comfortably during my examination.   He arrived in the ICU with Right IJ transvenous temporary pacemaker.     SIGNIFICANT EVENTS   5/22 Left total knee arthroplasty 5/24 transferred to ICU because of asymptomatic bradycardia, confused in early evening   Review of Systems  Constitutional: Negative for fever, chills weight loss HENT: Negative for ear pain, nosebleeds, congestion, facial swelling, rhinorrhea, neck pain, neck stiffness and ear discharge.   Respiratory: Negative for cough, shortness of breath, wheezing  Cardiovascular: Negative for chest pain, palpitations and leg swelling.  Gastrointestinal: Negative for heartburn, abdominal pain, vomiting, diarrhea or consitpation Genitourinary: Negative for dysuria, urgency, frequency, hematuria Musculoskeletal: Negative for back pain or joint pain Neurological: Negative for dizziness, seizures, syncope, focal weakness,  numbness and headaches.  Hematological: Does not bruise/bleed easily.  Psychiatric/Behavioral: Negative for hallucinations, confusion, dysphoric mood      VITAL SIGNS    Temp:  [97.7 F (36.5 C)-100.4 F (38 C)] 97.7 F (36.5 C) (05/25 1030) Pulse Rate:  [52-80] 62 (05/25 1030) Resp:  [11-25] 20 (05/25 1030) BP: (111-150)/(52-107) 126/65 (05/25 0900) SpO2:  [89 %-100 %] 93 %  (05/25 1030) Weight:  [198 lb 10.2 oz (90.1 kg)] 198 lb 10.2 oz (90.1 kg) (05/24 1240) HEMODYNAMICS:  no compromise RA oxygen   INTAKE / OUTPUT:  Intake/Output Summary (Last 24 hours) at 07/11/2017 1103 Last data filed at 07/11/2017 1010 Gross per 24 hour  Intake 1219.33 ml  Output 5100 ml  Net -3880.67 ml       PHYSICAL EXAM    Constitutional: Appears well-developed and well-nourished. No distress. HENT: Normocephalic. [name] Oropharynx is clear and moist.  Eyes: Conjunctivae and EOM are normal. PERRLA, no scleral icterus.  Neck: Normal ROM. Neck supple. No JVD. No tracheal deviation. CVS: SR Bradycardic S1/S2 +, no murmurs, no gallops, no carotid bruit.  Pulmonary: Effort and breath sounds normal, no stridor, rhonchi, wheezes, rales.  Abdominal: Soft. BS +,  no distension, tenderness, rebound or guarding.  Musculoskeletal: Normal range of motion. No edema and no tenderness.  Neuro: Alert. CN 2-12 grossly intact. No focal deficits. Skin: Skin is warm and dry. No rash noted.Left knee dressing in place Psychiatric: Normal mood and affect.       LABS   LABS:  CBC Recent Labs  Lab 07/09/17 0335 07/10/17 0613 07/11/17 0527  WBC 7.7 8.6 9.3  HGB 10.7* 10.5* 9.8*  HCT 31.7* 31.0* 28.9*  PLT 167 149* 158   Coag's No results for input(s): APTT, INR in the last 168 hours. BMET Recent Labs  Lab 07/08/17 1304 07/09/17 0335 07/11/17 0527  NA  --  137 138  K  --  4.5 4.4  CL  --  105 105  CO2  --  28 26  BUN  --  21* 27*  CREATININE 0.93 1.03 1.19  GLUCOSE  --  116* 130*   Electrolytes Recent Labs  Lab 07/09/17 0335 07/11/17 0527  CALCIUM 8.4* 8.6*  MG  --  1.7  PHOS  --  3.3   Sepsis Markers No results for input(s): LATICACIDVEN, PROCALCITON, O2SATVEN in the last 168 hours. ABG No results for input(s): PHART, PCO2ART, PO2ART in the last 168 hours. Liver Enzymes No results for input(s): AST, ALT, ALKPHOS, BILITOT, ALBUMIN in the last 168 hours. Cardiac  Enzymes No results for input(s): TROPONINI, PROBNP in the last 168 hours. Glucose Recent Labs  Lab 07/10/17 0739 07/10/17 1240 07/10/17 1601 07/10/17 2009 07/10/17 2117 07/11/17 0713  GLUCAP 139* 116* 131* 110* 138* 119*     No results found for this or any previous visit (from the past 240 hour(s)).   Current Facility-Administered Medications:  .  0.45 % sodium chloride infusion, , Intravenous, Continuous, Earnestine Leys, MD, Stopped at 07/10/17 1200 .  acetaminophen (TYLENOL) tablet 650 mg, 650 mg, Oral, Q6H PRN, Bettey Costa, MD, 650 mg at 07/11/17 0848 .  albuterol (PROVENTIL) (2.5 MG/3ML) 0.083% nebulizer solution 2.5 mg, 2.5 mg, Nebulization, Q4H PRN, Gouru, Aruna, MD .  alum & mag hydroxide-simeth (MAALOX/MYLANTA) 200-200-20 MG/5ML suspension 30 mL, 30 mL, Oral, Q4H PRN, Earnestine Leys, MD, 30 mL at 07/10/17 1849 .  bisacodyl (DULCOLAX) suppository 10 mg, 10 mg, Rectal, Daily PRN, Earnestine Leys, MD .  clonazePAM Bobbye Charleston) tablet 1 mg, 1 mg, Oral, QHS, Earnestine Leys, MD, 1 mg at 07/10/17 2210 .  diphenhydrAMINE (BENADRYL) 12.5 MG/5ML elixir 12.5-25 mg, 12.5-25 mg, Oral, Q4H PRN, Earnestine Leys, MD .  docusate sodium (COLACE) capsule 100 mg, 100 mg, Oral, BID, Earnestine Leys, MD, 100 mg at 07/11/17 0848 .  empagliflozin (JARDIANCE) tablet 10 mg, 10 mg, Oral, Daily, Earnestine Leys, MD, 10 mg at 07/09/17 1227 .  enoxaparin (LOVENOX) injection 40 mg, 40 mg, Subcutaneous, Q24H, Maccia, Melissa D, RPH, 40 mg at 07/11/17 0848 .  ezetimibe (ZETIA) tablet 10 mg, 10 mg, Oral, Daily, Earnestine Leys, MD, 10 mg at 07/11/17 0849 .  ferrous sulfate tablet 325 mg, 325 mg, Oral, TID Loni Dolly, MD, 325 mg at 07/11/17 0849 .  insulin aspart (novoLOG) injection 0-15 Units, 0-15 Units, Subcutaneous, TID WC, Earnestine Leys, MD, 2 Units at 07/10/17 1605 .  insulin glargine (LANTUS) injection 0-40 Units, 0-40 Units, Subcutaneous, See admin instructions, Earnestine Leys, MD .   ipratropium-albuterol (DUONEB) 0.5-2.5 (3) MG/3ML nebulizer solution 3 mL, 3 mL, Nebulization, Q6H PRN, Lafayette Dragon, MD .  magnesium hydroxide (MILK OF MAGNESIA) suspension 30 mL, 30 mL, Oral, Daily PRN, Earnestine Leys, MD .  menthol-cetylpyridinium (CEPACOL) lozenge 3 mg, 1 lozenge, Oral, PRN **OR** phenol (CHLORASEPTIC) mouth spray 1 spray, 1 spray, Mouth/Throat, PRN, Earnestine Leys, MD .  methocarbamol (ROBAXIN) tablet 500 mg, 500 mg, Oral, Q6H PRN, 500 mg at 07/09/17 1106 **OR** methocarbamol (ROBAXIN) 500 mg in dextrose 5 % 50 mL IVPB, 500 mg, Intravenous, Q6H PRN, Earnestine Leys, MD .  morphine 2 MG/ML injection 1 mg, 1 mg, Intravenous, Q2H PRN, Earnestine Leys, MD, 1 mg at 07/09/17 2007 .  ondansetron (ZOFRAN) tablet 4 mg, 4 mg, Oral, Q6H PRN **OR** ondansetron (ZOFRAN) injection 4 mg, 4 mg, Intravenous, Q6H PRN, Earnestine Leys, MD .  oxyCODONE (Oxy IR/ROXICODONE) immediate release tablet 5-10 mg, 5-10 mg, Oral, Q6H PRN, Gouru, Aruna, MD, 5 mg at 07/11/17 0333 .  pantoprazole (PROTONIX) EC tablet 40 mg, 40 mg, Oral, Daily, Earnestine Leys, MD, 40 mg at 07/11/17 0848 .  pneumococcal 23 valent vaccine (PNU-IMMUNE) injection 0.5 mL, 0.5 mL, Intramuscular, Tomorrow-1000, Earnestine Leys, MD .  sodium phosphate (FLEET) 7-19 GM/118ML enema 1 enema, 1 enema, Rectal, Once PRN, Earnestine Leys, MD .  thiamine (B-1) injection 100 mg, 100 mg, Intravenous, Daily, Lafayette Dragon, MD, 100 mg at 07/11/17 0848 .  tiZANidine (ZANAFLEX) tablet 4 mg, 4 mg, Oral, QHS, Earnestine Leys, MD, 4 mg at 07/11/17 0026 .  vitamin B-12 (CYANOCOBALAMIN) tablet 500 mcg, 500 mcg, Oral, Daily, Earnestine Leys, MD, 500 mcg at 07/11/17 0849 .  zolpidem (AMBIEN) tablet 5 mg, 5 mg, Oral, QHS PRN, Earnestine Leys, MD  IMAGING    No results found.    MAJOR EVENTS/TEST RESULTS: 5/24 bradycardia 5/25 transvenous pacer removed INDWELLING DEVICES:: Peripheral IVs  MICRO DATA: MRSA PCR negative    ANTIMICROBIALS:   None   ASSESSMENT/PLAN    1.  Symptomatic bradycardia. Beta blocker has been discontinued. Transvenous pacer removed and patient has maintained SR. Plan: Management as per cardiology team  2.  Hx of CAD  Had been on Coreg. Plan: continue hold of Coreg   3.  Chronic systolic heart failure ejection fraction 45% without signs of exacerbation  4.  Diabetes: Continue current regimen with sliding scale and ADA diet Plan: target Glucose management  5.  Left total knee arthroplasty: Management as per orthopedic surgery Plan: PT/ Management as per surgical team.  6. Night time confusion Plan: continue on Thiamine.  Family: updated  Disp: may transfer out of ICU  Thank you for allowing me the privileges to care for this patient.  Cammie Sickle, M.D

## 2017-07-11 NOTE — Progress Notes (Signed)
Report called to [facility] on 1A, VSS on room air, NSR with temp pacer wires pulled about 0830 this AM, Pt is up to his chairm has had a bath, continent of B&B this AM. Will move to room 143 in his recliner after he is seen by Dr Mack Guise.  Chart and belongings to transport with him.

## 2017-07-11 NOTE — Evaluation (Signed)
Physical Therapy Evaluation Patient Details Name: Erik Collins MRN: [id number] DOB: Dec 29, 1945 Today's Date: 07/11/2017   History of Present Illness  72 year old male admitted s/p L TKA.  PMH includes MI, lung CA, DM, CA and arthritis.  Clinical Impression  Pt was seen for initial eval since pacemaker wires were pulled on his external pacer this AM and now is referred to pT to continue rehab.  Pt was up in chair and anxious to do much but agreed to chair exercise.  Has immobilizer in place and did not want to remove it.  After his visit let PT remove based on ck of orders, then PT discussed the goals of therapy with he and wife, daughter.  Will continue acutely and today just a single visit as pt is not feeling well after his medical issues, and will resume BID on 5/27.  Continues to want to go home and will evaluate this as we go.  Instructed pt and family on TENS unit as pt is using continuous output, set to modulated with better perceived benefit by pt.  Follow acutely until dc for ROM, strengthenig and balance with gait and transfers.    Follow Up Recommendations Home health PT    Equipment Recommendations  Rolling walker with 5" wheels    Recommendations for Other Services       Precautions / Restrictions Precautions Precautions: Knee;Fall Precaution Booklet Issued: Yes (comment) Required Braces or Orthoses: Knee Immobilizer - Left Knee Immobilizer - Left: On when out of bed or walking Restrictions Weight Bearing Restrictions: Yes LLE Weight Bearing: Partial weight bearing LLE Partial Weight Bearing Percentage or Pounds: 50      Mobility  Bed Mobility               General bed mobility comments: up in chair when PT arrived  Transfers                 General transfer comment: pt declined to stand up  Ambulation/Gait             General Gait Details: refused to attempt to stand  Stairs            Wheelchair Mobility    Modified Rankin  (Stroke Patients Only)       Balance Overall balance assessment: Needs assistance Sitting-balance support: Feet supported;Bilateral upper extremity supported Sitting balance-Leahy Scale: Good                                       Pertinent Vitals/Pain Pain Assessment: Faces Faces Pain Scale: Hurts even more Pain Location: L knee Pain Descriptors / Indicators: Operative site guarding Pain Intervention(s): Monitored during session;Premedicated before session;Repositioned    Home Living Family/patient expects to be discharged to:: Private residence Living Arrangements: Spouse/significant other Available Help at Discharge: Family;Available 24 hours/day Type of Home: House Home Access: Stairs to enter Entrance Stairs-Rails: None Entrance Stairs-Number of Steps: 2 Home Layout: One level Home Equipment: None      Prior Function Level of Independence: Independent               Hand Dominance   Dominant Hand: Right    Extremity/Trunk Assessment   Upper Extremity Assessment Upper Extremity Assessment: Overall WFL for tasks assessed    Lower Extremity Assessment Lower Extremity Assessment: LLE deficits/detail LLE Deficits / Details: TKA  LLE Coordination: decreased fine motor;decreased gross motor  Cervical / Trunk Assessment Cervical / Trunk Assessment: Normal  Communication   Communication: No difficulties  Cognition Arousal/Alertness: Awake/alert Behavior During Therapy: WFL for tasks assessed/performed Overall Cognitive Status: Within Functional Limits for tasks assessed                                 General Comments: alert and anxious but then comfortable      General Comments      Exercises Total Joint Exercises Ankle Circles/Pumps: AROM;Both;5 reps Quad Sets: AROM;Both;10 reps Hip ABduction/ADduction: AROM;AAROM;Both;10 reps Straight Leg Raises: AROM;AAROM;Both;10 reps Goniometric ROM: knee ext -25 deg    Assessment/Plan    PT Assessment Patient needs continued PT services  PT Problem List Decreased strength;Decreased mobility;Decreased range of motion;Decreased coordination;Decreased activity tolerance;Decreased balance;Decreased knowledge of use of DME;Pain;Decreased knowledge of precautions       PT Treatment Interventions DME instruction;Gait training;Stair training;Functional mobility training;Therapeutic activities;Therapeutic exercise;Balance training;Neuromuscular re-education;Patient/family education    PT Goals (Current goals can be found in the Care Plan section)  Acute Rehab PT Goals Patient Stated Goal: To return home and back to work as soon as possible. PT Goal Formulation: With patient/family Time For Goal Achievement: 07/25/17 Potential to Achieve Goals: Good    Frequency 7X/week   Barriers to discharge Inaccessible home environment;Decreased caregiver support home with wife and has stairs to enter house    Co-evaluation               AM-PAC PT "6 Clicks" Daily Activity  Outcome Measure Difficulty turning over in bed (including adjusting bedclothes, sheets and blankets)?: A Little Difficulty moving from lying on back to sitting on the side of the bed? : Unable Difficulty sitting down on and standing up from a chair with arms (e.g., wheelchair, bedside commode, etc,.)?: Unable Help needed moving to and from a bed to chair (including a wheelchair)?: A Little Help needed walking in hospital room?: A Little Help needed climbing 3-5 steps with a railing? : Total 6 Click Score: 12    End of Session   Activity Tolerance: Patient limited by fatigue Patient left: in chair;with call bell/phone within reach;with family/visitor present;with nursing/sitter in room Nurse Communication: Mobility status PT Visit Diagnosis: Muscle weakness (generalized) (M62.81);Pain Pain - Right/Left: Left Pain - part of body: Knee    Time: 1102-1129 PT Time Calculation (min)  (ACUTE ONLY): 27 min   Charges:   PT Evaluation $PT Eval Moderate Complexity: 1 Mod PT Treatments $Therapeutic Exercise: 8-22 mins   PT G Codes:   PT G-Codes **NOT FOR INPATIENT CLASS** Functional Assessment Tool Used: AM-PAC 6 Clicks Basic Mobility    Ramond Dial 07/11/2017, 1:20 PM   Mee Hives, PT MS Acute Rehab Dept. Number: [place] and [place]

## 2017-07-11 NOTE — Progress Notes (Signed)
[place] [facility] [place] NAME: Erik Collins    MR#:  [id number]  DATE OF BIRTH:  22-Sep-1945  SUBJECTIVE:  Patient is doing well this morning.  Pacemaker is turned to 50 and patient's heart rates are in the 60s to 70s.  Patient had some confusion overnight.  Family reports that he has no cognitive impairment/dementia baseline He is not confused this morning.  REVIEW OF SYSTEMS:    Review of Systems  Constitutional: Negative for fever, chills weight loss HENT: Negative for ear pain, nosebleeds, congestion, facial swelling, rhinorrhea, neck pain, neck stiffness and ear discharge.   Respiratory: Negative for cough, shortness of breath, wheezing  Cardiovascular: Negative for chest pain, palpitations and leg swelling.  + DIZZY Gastrointestinal: Negative for heartburn, abdominal pain, vomiting, diarrhea or consitpation Genitourinary: Negative for dysuria, urgency, frequency, hematuria Musculoskeletal: Negative for back pain or joint pain Neurological: Negative for dizziness, seizures, syncope, focal weakness,  numbness and headaches.  Hematological: Does not bruise/bleed easily.  Psychiatric/Behavioral: Negative for hallucinations, confusion, dysphoric mood    Tolerating Diet: yes      DRUG ALLERGIES:  No Known Allergies  VITALS:  Blood pressure 125/60, pulse 63, temperature 99.4 F (37.4 C), temperature source Oral, resp. rate 18, height 5\' 11"  (1.803 m), weight 90.1 kg (198 lb 10.2 oz), SpO2 (!) 89 %.  PHYSICAL EXAMINATION:  Constitutional: Appears well-developed and well-nourished. No distress. HENT: Normocephalic. [name] Oropharynx is clear and moist.  Eyes: Conjunctivae and EOM are normal. PERRLA, no scleral icterus.  Neck: Normal ROM. Neck supple. No JVD. No tracheal deviation. CVS: Bradycardic S1/S2 +, no murmurs, no gallops, no carotid bruit.  Pulmonary: Effort and breath sounds normal, no stridor, rhonchi, wheezes, rales.  Abdominal:  Soft. BS +,  no distension, tenderness, rebound or guarding.  Musculoskeletal: Normal range of motion. No edema and no tenderness.  Neuro: Alert. CN 2-12 grossly intact. No focal deficits. Skin: Skin is warm and dry. No rash noted. Psychiatric: Normal mood and affect.      LABORATORY PANEL:   CBC Recent Labs  Lab 07/11/17 0527  WBC 9.3  HGB 9.8*  HCT 28.9*  PLT 158   ------------------------------------------------------------------------------------------------------------------  Chemistries  Recent Labs  Lab 07/11/17 0527  NA 138  K 4.4  CL 105  CO2 26  GLUCOSE 130*  BUN 27*  CREATININE 1.19  CALCIUM 8.6*  MG 1.7   ------------------------------------------------------------------------------------------------------------------  Cardiac Enzymes No results for input(s): TROPONINI in the last 168 hours. ------------------------------------------------------------------------------------------------------------------  RADIOLOGY:  Dg Chest 1 View  Result Date: 07/10/2017 CLINICAL DATA:  Pt with fever of 100.2 this morning. EXAM: CHEST  1 VIEW COMPARISON:  12/17/2011 FINDINGS: Chronic scarring at the left lung base. No new infiltrate or overt edema. Heart size and mediastinal contours are within normal limits. Previous CABG. No effusion. Chronic blunting of the left lateral costophrenic angle. No pneumothorax. Previous median sternotomy. Probable left carotid bifurcation calcifications. IMPRESSION: Chronic and postop changes.  No acute findings. Electronically Signed   By: Lucrezia Europe M.D.   On: 07/10/2017 08:47     ASSESSMENT AND PLAN:   72 year old male with history of CAD/CABG, lung cancer status post lobectomy and COPD who had a left total knee arthroplasty and hospitalist was consulted for bradycardia.  1.  Symptomatic bradycardia: Patient has temporary transcutaneous pacemaker placed.  It appears that his heart rate has improved.  It is unlikely patient will  need permanent pacemaker.  Continue to hold beta-blocker.   2.  CAD: Coreg on hold due to bradycardia 3.  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  Diabetes: Continue current regimen with sliding scale and ADA diet  5.  Hyperlipidemia: Continue Zetia  6.  COPD without signs of exacerbation  7.  Left total knee arthroplasty: Management as per orthopedic surgery PT consultation pending Follow-up with orthopedic surgery in 10 days Aspirin 325 mg daily for 6 weeks for DVT prophylaxis as per orthopedic surgery.   Management plans discussed with the patient and family and they are in agreement.  CODE STATUS: Full  TOTAL TIME TAKING CARE OF THIS PATIENT: 24 minutes.     POSSIBLE D/C 1 to 2 days, DEPENDING ON CLINICAL CONDITION.   Dariah Mcsorley M.D on 07/11/2017 at 8:35 AM  Between 7am to 6pm - Pager - [phone] After 6pm go to [email or web address] - password [email or web address] [facility]  Office  [phone]  CC: Primary care physician; Perrin Maltese, MD  Note: This dictation was prepared with Dragon dictation along with smaller phrase technology. Any transcriptional errors that result from this process are unintentional.

## 2017-07-11 NOTE — Progress Notes (Signed)
SUBJECTIVE: doing well   Vitals:   07/11/17 0500 07/11/17 0600 07/11/17 0800 07/11/17 0900  BP: (!) 125/55 125/60 (!) 150/65 126/65  Pulse: 62 63 71 68  Resp: 17 18 (!) 25 17  Temp:   (!) 100.4 F (38 C)   TempSrc:   Oral   SpO2: 91% (!) 89% 92% 91%  Weight:      Height:        Intake/Output Summary (Last 24 hours) at 07/11/2017 1022 Last data filed at 07/11/2017 1010 Gross per 24 hour  Intake 1219.33 ml  Output 5100 ml  Net -3880.67 ml    LABS: Basic Metabolic Panel: Recent Labs    07/09/17 0335 07/11/17 0527  NA 137 138  K 4.5 4.4  CL 105 105  CO2 28 26  GLUCOSE 116* 130*  BUN 21* 27*  CREATININE 1.03 1.19  CALCIUM 8.4* 8.6*  MG  --  1.7  PHOS  --  3.3   Liver Function Tests: No results for input(s): AST, ALT, ALKPHOS, BILITOT, PROT, ALBUMIN in the last 72 hours. No results for input(s): LIPASE, AMYLASE in the last 72 hours. CBC: Recent Labs    07/10/17 0613 07/11/17 0527  WBC 8.6 9.3  HGB 10.5* 9.8*  HCT 31.0* 28.9*  MCV 89.3 88.4  PLT 149* 158   Cardiac Enzymes: No results for input(s): CKTOTAL, CKMB, CKMBINDEX, TROPONINI in the last 72 hours. BNP: Invalid input(s): POCBNP D-Dimer: No results for input(s): DDIMER in the last 72 hours. Hemoglobin A1C: No results for input(s): HGBA1C in the last 72 hours. Fasting Lipid Panel: No results for input(s): CHOL, HDL, LDLCALC, TRIG, CHOLHDL, LDLDIRECT in the last 72 hours. Thyroid Function Tests: Recent Labs    07/10/17 0613  TSH 5.130*   Anemia Panel: No results for input(s): VITAMINB12, FOLATE, FERRITIN, TIBC, IRON, RETICCTPCT in the last 72 hours.   PHYSICAL EXAM General: Well developed, well nourished, in no acute distress HEENT:  Normocephalic and atramatic Neck:  No JVD.  Lungs: Clear bilaterally to auscultation and percussion. Heart: HRRR . Normal S1 and S2 without gallops or murmurs.  Abdomen: Bowel sounds are positive, abdomen soft and non-tender  Msk:  Back normal, normal gait.  Normal strength and tone for age. Extremities: No clubbing, cyanosis or edema.   Neuro: Alert and oriented X 3. Psych:  Good affect, responds appropriately  TELEMETRY: NSR  ASSESSMENT AND PLAN: S/p temporary pace maker insertion for severe bradycardia. Wire pulled as heart rate 66. Will hold off coreg and may start much lower dosage 3.125 bid if heart rate goes much higher.  Active Problems:   Total knee replacement status    Rise Traeger A, MD, [name] 07/11/2017 10:22 AM

## 2017-07-11 NOTE — Progress Notes (Signed)
Patient transferred from ICU, family at bedside. No signs of distress. Polar care intact and on. Patient up in chair resting comfortably.

## 2017-07-11 NOTE — Progress Notes (Signed)
Subjective:  Patient in the ICU after temporary pacemaker placement for bradycardia.  This maker has been discontinued.  Patient is up out of bed to a chair.  He was seen by physical therapy today.  His wife is at the bedside.  Patient reports left knee pain as moderate.  He denies any chest pain or shortness of breath.  Objective:   VITALS:   Vitals:   07/11/17 1030 07/11/17 1100 07/11/17 1200 07/11/17 1300  BP:  (!) 141/61 127/67 127/67  Pulse: 62 61 62 63  Resp: 20 (!) 21 19 17   Temp: 97.7 F (36.5 C)  98 F (36.7 C)   TempSrc: Oral  Oral   SpO2: 93% 94% 96% 96%  Weight:      Height:        PHYSICAL EXAM: Left lower extremity: Neurovascular intact Sensation intact distally Intact pulses distally Dorsiflexion/Plantar flexion intact Incision: dressing C/D/I No cellulitis present Compartment soft  LABS  Results for orders placed or performed during the hospital encounter of 07/08/17 (from the past 24 hour(s))  Glucose, capillary     Status: Abnormal   Collection Time: 07/10/17  4:01 PM  Result Value Ref Range   Glucose-Capillary 131 (H) 65 - 99 mg/dL  Urinalysis, Routine w reflex microscopic     Status: Abnormal   Collection Time: 07/10/17  4:09 PM  Result Value Ref Range   Color, Urine STRAW (A) YELLOW   APPearance CLEAR (A) CLEAR   Specific Gravity, Urine 1.006 1.005 - 1.030   pH 5.0 5.0 - 8.0   Glucose, UA >=500 (A) NEGATIVE mg/dL   Hgb urine dipstick SMALL (A) NEGATIVE   Bilirubin Urine NEGATIVE NEGATIVE   Ketones, ur NEGATIVE NEGATIVE mg/dL   Protein, ur NEGATIVE NEGATIVE mg/dL   Nitrite NEGATIVE NEGATIVE   Leukocytes, UA NEGATIVE NEGATIVE   RBC / HPF 0-5 0 - 5 RBC/hpf   WBC, UA 0-5 0 - 5 WBC/hpf   Bacteria, UA NONE SEEN NONE SEEN   Squamous Epithelial / LPF 0-5 0 - 5  Glucose, capillary     Status: Abnormal   Collection Time: 07/10/17  8:09 PM  Result Value Ref Range   Glucose-Capillary 110 (H) 65 - 99 mg/dL   Comment 1 Notify RN   Glucose,  capillary     Status: Abnormal   Collection Time: 07/10/17  9:17 PM  Result Value Ref Range   Glucose-Capillary 138 (H) 65 - 99 mg/dL   Comment 1 Notify RN   CBC     Status: Abnormal   Collection Time: 07/11/17  5:27 AM  Result Value Ref Range   WBC 9.3 3.8 - 10.6 K/uL   RBC 3.27 (L) 4.40 - 5.90 MIL/uL   Hemoglobin 9.8 (L) 13.0 - 18.0 g/dL   HCT 28.9 (L) 40.0 - 52.0 %   MCV 88.4 80.0 - 100.0 fL   MCH 30.1 26.0 - 34.0 pg   MCHC 34.0 32.0 - 36.0 g/dL   RDW 14.0 11.5 - 14.5 %   Platelets 158 150 - 440 K/uL  Basic metabolic panel     Status: Abnormal   Collection Time: 07/11/17  5:27 AM  Result Value Ref Range   Sodium 138 135 - 145 mmol/L   Potassium 4.4 3.5 - 5.1 mmol/L   Chloride 105 101 - 111 mmol/L   CO2 26 22 - 32 mmol/L   Glucose, Bld 130 (H) 65 - 99 mg/dL   BUN 27 (H) 6 - 20 mg/dL  Creatinine, Ser 1.19 0.61 - 1.24 mg/dL   Calcium 8.6 (L) 8.9 - 10.3 mg/dL   GFR calc non Af Amer 59 (L) >60 mL/min   GFR calc Af Amer >60 >60 mL/min   Anion gap 7 5 - 15  Magnesium     Status: None   Collection Time: 07/11/17  5:27 AM  Result Value Ref Range   Magnesium 1.7 1.7 - 2.4 mg/dL  Phosphorus     Status: None   Collection Time: 07/11/17  5:27 AM  Result Value Ref Range   Phosphorus 3.3 2.5 - 4.6 mg/dL  Glucose, capillary     Status: Abnormal   Collection Time: 07/11/17  7:13 AM  Result Value Ref Range   Glucose-Capillary 119 (H) 65 - 99 mg/dL  Glucose, capillary     Status: Abnormal   Collection Time: 07/11/17 11:22 AM  Result Value Ref Range   Glucose-Capillary 150 (H) 65 - 99 mg/dL    Dg Chest 1 View  Result Date: 07/10/2017 CLINICAL DATA:  Pt with fever of 100.2 this morning. EXAM: CHEST  1 VIEW COMPARISON:  12/17/2011 FINDINGS: Chronic scarring at the left lung base. No new infiltrate or overt edema. Heart size and mediastinal contours are within normal limits. Previous CABG. No effusion. Chronic blunting of the left lateral costophrenic angle. No pneumothorax. Previous  median sternotomy. Probable left carotid bifurcation calcifications. IMPRESSION: Chronic and postop changes.  No acute findings. Electronically Signed   By: Lucrezia Europe M.D.   On: 07/10/2017 08:47    Assessment/Plan: 1 Day Post-Op   Active Problems:   Total knee replacement status  Patient doing well from an orthopedic standpoint.  Continue physical therapy and current pain management.  Patient has been cleared medically to go to the orthopedic floor.  Appreciate medicine and cardiology following this patient.  Patient will remain in the hospital for another 1 to 2 days for observation given his recent recent cardiac issues.    Thornton Park , MD 07/11/2017, 1:51 PM

## 2017-07-12 ENCOUNTER — Inpatient Hospital Stay: Payer: Medicare Other

## 2017-07-12 LAB — GLUCOSE, CAPILLARY: Glucose-Capillary: 151 mg/dL — ABNORMAL HIGH (ref 65–99)

## 2017-07-12 MED ORDER — LISINOPRIL 5 MG PO TABS: 5.0000 mg | ORAL_TABLET | Freq: Every day | ORAL | 0 refills | Status: DC

## 2017-07-12 MED ORDER — OXYCODONE HCL 5 MG PO TABS: 5.0000 mg | ORAL_TABLET | Freq: Four times a day (QID) | ORAL | 0 refills | Status: DC | PRN

## 2017-07-12 MED ORDER — FAMOTIDINE 20 MG PO TABS: 20.0000 mg | ORAL_TABLET | Freq: Two times a day (BID) | ORAL | 1 refills | Status: DC

## 2017-07-12 MED ORDER — ASPIRIN EC 325 MG PO TBEC: 325.0000 mg | DELAYED_RELEASE_TABLET | Freq: Every day | ORAL | 3 refills | Status: DC

## 2017-07-12 MED ORDER — VITAMIN B-1 100 MG PO TABS: 100.0000 mg | ORAL_TABLET | Freq: Every day | ORAL | Status: DC

## 2017-07-12 MED ORDER — ASPIRIN EC 325 MG PO TBEC: 325.0000 mg | DELAYED_RELEASE_TABLET | Freq: Two times a day (BID) | ORAL | 0 refills | Status: AC

## 2017-07-12 NOTE — Discharge Summary (Addendum)
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 Arthritis   . Cancer ([place])    hx lung cancer  . CHF (congestive heart failure) ([place])   . COPD (chronic obstructive pulmonary disease) ([place])   . Coronary artery disease    s/p CABG  . Diabetes mellitus without complication ([place])   . GERD (gastroesophageal reflux disease)   . Hypercholesterolemia   . Hypertension   . Lung cancer ([place])   . Myocardial infarction [facility])    09/2009 f/up with Dr. Neoma Laming  . Sleep apnea     HOSPITAL COURSE:   72 year old male with history of CAD/CABG, lung cancer status post lobectomy and COPD who had a left total knee arthroplasty and hospitalist was consulted for bradycardia.  1.  Symptomatic bradycardia: Patient had feelings of lightheadedness and disorientation.  Symptoms were from bradycardia.  Beta-blocker was discontinued.  He underwent temporary transcutaneous pacemaker placed.  He did not need permanent pacemaker as his heart rates improved after discontinuation of the beta-blocker. For now carvedilol is discontinued.  2.  CAD: Coreg on hold due to bradycardia 3.  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  Diabetes: He will continue outpatient regimen with ADA diet  5.  Hyperlipidemia: Continue Zetia  6.  COPD without signs of exacerbation  7.  Left total knee arthroplasty: Dr. Sabra Heck has recommended aspirin 325 mg BID for 6 weeks for DVT prophylaxis and patient will follow-up with Dr. Sabra Heck in  1 week. I added pepcid to prevent ulcers while on ASA. 8.  Essential hypertension: Patient will be started on lisinopril in the place of Coreg    DISCHARGE CONDITIONS AND DIET:   Stable for discharge on regular diet/cardiac diet  CONSULTS OBTAINED:  Treatment Team:  Nicholes Mango, MD Allyne Gee, MD Dionisio David, MD  DRUG ALLERGIES:  No Known Allergies  DISCHARGE MEDICATIONS:   Allergies as of 07/12/2017   No Known Allergies     Medication List    STOP taking these medications   carvedilol 25 MG tablet Commonly known as:  COREG   traMADol 50 MG tablet Commonly known as:  ULTRAM     TAKE these medications   aspirin EC 325 MG tablet Take 1 tablet (325 mg total) by mouth 2 (two) times daily. For 6 weeks What changed:    medication strength  how much to take  when to take this  additional instructions   clonazePAM 1 MG tablet Commonly known as:  KLONOPIN Take 1 mg by mouth at bedtime. For sleep   ezetimibe 10 MG tablet Commonly known as:  ZETIA Take 10 mg by mouth daily.   famotidine 20 MG tablet Commonly known as:  PEPCID Take 1 tablet (20 mg total) by mouth 2 (two) times daily.   Fish Oil 1200 MG Caps Take 1,200 mg by mouth 2 (two) times daily.   JARDIANCE 10 MG Tabs tablet Generic drug:  empagliflozin Take 10 mg by mouth daily.   lisinopril 5 MG tablet  Commonly known as:  PRINIVIL,ZESTRIL Take 1 tablet (5 mg total) by mouth daily.   metFORMIN 1000 MG tablet Commonly known as:  GLUCOPHAGE Take 1,000 mg by mouth 2 (two) times daily.   omeprazole 20 MG capsule Commonly known as:  PRILOSEC Take 20 mg by mouth daily.   oxyCODONE 5 MG immediate release tablet Commonly known as:  Oxy IR/ROXICODONE Take 1-2 tablets (5-10 mg total) by mouth every 6 (six) hours as needed for moderate pain or severe pain.   tiZANidine 2 MG tablet Commonly known as:  ZANAFLEX Take 4 mg by mouth at bedtime.   TOUJEO MAX SOLOSTAR 300 UNIT/ML Sopn Generic  drug:  Insulin Glargine Inject 0-40 Units into the skin See admin instructions. Sliding scale insulin per patient  3 units if blood sugar is greater than 150 during the day, if blood sugars are less= no insulin 40 units if blood sugar is greater than 150 at night, if blood sugars are less=no insulin   TYLENOL 8 HOUR ARTHRITIS PAIN 650 MG CR tablet Generic drug:  acetaminophen Take 1,300 mg by mouth 2 (two) times daily.   vitamin B-12 500 MCG tablet Commonly known as:  CYANOCOBALAMIN Take 500 mcg by mouth daily.            Durable Medical Equipment  (From admission, onward)        Start     Ordered   07/12/17 0941  For home use only DME Gilford Rile  [facility])  Once    Question:  Patient needs a walker to treat with the following condition  Answer:  H/O knee surgery   07/12/17 0940   07/08/17 1228  DME Walker rolling  Once    Question:  Patient needs a walker to treat with the following condition  Answer:  Total knee replacement status   07/08/17 1227        Today   CHIEF COMPLAINT:   Patient doing well this am wants to go home   VITAL SIGNS:  Blood pressure (!) 142/71, pulse 76, temperature 98.8 F (37.1 C), resp. rate 18, height 5\' 11"  (1.803 m), weight 90.1 kg (198 lb 10.2 oz), SpO2 95 %.   REVIEW OF SYSTEMS:  Review of Systems  Constitutional: Negative.  Negative for chills, fever and malaise/fatigue.  HENT: Negative.  Negative for ear discharge, ear pain, hearing loss, nosebleeds and sore throat.   Eyes: Negative.  Negative for blurred vision and pain.  Respiratory: Negative.  Negative for cough, hemoptysis, shortness of breath and wheezing.   Cardiovascular: Negative.  Negative for chest pain, palpitations and leg swelling.  Gastrointestinal: Negative.  Negative for abdominal pain, blood in stool, diarrhea, nausea and vomiting.  Genitourinary: Negative.  Negative for dysuria.  Musculoskeletal: Negative.  Negative for back pain.  Skin: Negative.    Neurological: Negative for dizziness, tremors, speech change, focal weakness, seizures and headaches.  Endo/Heme/Allergies: Negative.  Does not bruise/bleed easily.  Psychiatric/Behavioral: Negative.  Negative for depression, hallucinations and suicidal ideas.     PHYSICAL EXAMINATION:  GENERAL:  72 y.o.-year-old patient lying in the bed with no acute distress.  NECK:  Supple, no jugular venous distention. No thyroid enlargement, no tenderness.  LUNGS: Normal breath sounds bilaterally, no wheezing, rales,rhonchi  No use of accessory muscles of respiration.  CARDIOVASCULAR: S1, S2 normal. No murmurs, rubs, or gallops.  ABDOMEN: Soft, non-tender, non-distended. Bowel sounds present. No organomegaly or mass.  EXTREMITIES: No pedal edema, cyanosis, or clubbing.  PSYCHIATRIC: The patient is alert and  oriented x 3.  SKIN: No obvious rash, lesion, or ulcer.   DATA REVIEW:   CBC Recent Labs  Lab 07/11/17 0527  WBC 9.3  HGB 9.8*  HCT 28.9*  PLT 158    Chemistries  Recent Labs  Lab 07/11/17 0527  NA 138  K 4.4  CL 105  CO2 26  GLUCOSE 130*  BUN 27*  CREATININE 1.19  CALCIUM 8.6*  MG 1.7    Cardiac Enzymes No results for input(s): TROPONINI in the last 168 hours.  Microbiology Results  @[email or web address] @  RADIOLOGY:  Dg Chest 1 View  Result Date: 07/12/2017 CLINICAL DATA:  Fever. EXAM: CHEST  1 VIEW COMPARISON:  Radiograph of Jul 10, 2017. FINDINGS: The heart size and mediastinal contours are within normal limits. Status post coronary bypass graft. No pneumothorax is noted. Right lung is clear. Mild left basilar subsegmental atelectasis is noted with minimal left pleural effusion. The visualized skeletal structures are unremarkable. IMPRESSION: Mild left basilar subsegmental atelectasis is noted with minimal left pleural effusion. Electronically Signed   By: Marijo Conception, M.D.   On: 07/12/2017 08:03      Allergies as of 07/12/2017   No Known Allergies     Medication  List    STOP taking these medications   carvedilol 25 MG tablet Commonly known as:  COREG   traMADol 50 MG tablet Commonly known as:  ULTRAM     TAKE these medications   aspirin EC 325 MG tablet Take 1 tablet (325 mg total) by mouth 2 (two) times daily. For 6 weeks What changed:    medication strength  how much to take  when to take this  additional instructions   clonazePAM 1 MG tablet Commonly known as:  KLONOPIN Take 1 mg by mouth at bedtime. For sleep   ezetimibe 10 MG tablet Commonly known as:  ZETIA Take 10 mg by mouth daily.   famotidine 20 MG tablet Commonly known as:  PEPCID Take 1 tablet (20 mg total) by mouth 2 (two) times daily.   Fish Oil 1200 MG Caps Take 1,200 mg by mouth 2 (two) times daily.   JARDIANCE 10 MG Tabs tablet Generic drug:  empagliflozin Take 10 mg by mouth daily.   lisinopril 5 MG tablet Commonly known as:  PRINIVIL,ZESTRIL Take 1 tablet (5 mg total) by mouth daily.   metFORMIN 1000 MG tablet Commonly known as:  GLUCOPHAGE Take 1,000 mg by mouth 2 (two) times daily.   omeprazole 20 MG capsule Commonly known as:  PRILOSEC Take 20 mg by mouth daily.   oxyCODONE 5 MG immediate release tablet Commonly known as:  Oxy IR/ROXICODONE Take 1-2 tablets (5-10 mg total) by mouth every 6 (six) hours as needed for moderate pain or severe pain.   tiZANidine 2 MG tablet Commonly known as:  ZANAFLEX Take 4 mg by mouth at bedtime.   TOUJEO MAX SOLOSTAR 300 UNIT/ML Sopn Generic drug:  Insulin Glargine Inject 0-40 Units into the skin See admin instructions. Sliding scale insulin per patient  3 units if blood sugar is greater than 150 during the day, if blood sugars are less= no insulin 40 units if blood sugar is greater than 150 at night, if blood sugars are less=no insulin   TYLENOL 8 HOUR ARTHRITIS PAIN 650 MG CR tablet Generic drug:  acetaminophen Take 1,300 mg by mouth 2 (two) times daily.   vitamin B-12 500 MCG tablet Commonly  known as:  CYANOCOBALAMIN Take 500 mcg by mouth daily.  Durable Medical Equipment  (From admission, onward)        Start     Ordered   07/12/17 0941  For home use only DME Gilford Rile  [facility])  Once    Question:  Patient needs a walker to treat with the following condition  Answer:  H/O knee surgery   07/12/17 0940   07/08/17 1228  DME Walker rolling  Once    Question:  Patient needs a walker to treat with the following condition  Answer:  Total knee replacement status   07/08/17 1227         Management plans discussed with the patient and he is in agreement. Stable for discharge home with [facility]  Patient should follow up with PCP  CODE STATUS:     Code Status Orders  (From admission, onward)        Start     Ordered   07/08/17 1228  Full code  Continuous     07/08/17 1227    Code Status History    This patient has a current code status but no historical code status.      TOTAL TIME TAKING CARE OF THIS PATIENT: 38 minutes.    Note: This dictation was prepared with Dragon dictation along with smaller phrase technology. Any transcriptional errors that result from this process are unintentional.  Shaylin Blatt M.D on 07/12/2017 at 10:57 AM  Between 7am to 6pm - Pager - [phone] After 6pm go to [email or web address] - password [email or web address] [facility]  Office  [phone]  CC: Primary care physician; Perrin Maltese, MD

## 2017-07-12 NOTE — Progress Notes (Addendum)
Physical Therapy Treatment Patient Details Name: Erik Collins MRN: [id number] DOB: [birth year] Today's Date: 07/12/2017    History of Present Illness Erik Collins is a 72 year old male admitted s/p L TKA 07/08/17.  PMH includes MI, lung CA, DM, CA and arthritis. Moved to CCU 07/10/17 for bradycardia and temporary transcutaneous pacemaker, DC'd same day, then back to ortho floor on 07/11/17.     PT Comments    Pt progressing well overall, continues to AMB near 183ft, and able to participate in stairs training this session. Educated on precautions once again, as he has no recollection of PWB status, how to don KI, and that he should maintain knee in extension when resting. TENS unit battery dead and changes, 2 electrode now nonadherent to skin, also changed; pt/family educated. Pt given handout for exercises. HR monitored throughout without any noted abnormality. Pt safe for DC to home from PT perspective.    Follow Up Recommendations  Home health PT     Equipment Recommendations  Rolling walker with 5" wheels    Recommendations for Other Services       Precautions / Restrictions Precautions Precautions: Knee;Fall Precaution Booklet Issued: Yes (comment) Required Braces or Orthoses: Knee Immobilizer - Left Knee Immobilizer - Left: On when out of bed or walking Restrictions Weight Bearing Restrictions: Yes LLE Weight Bearing: Partial weight bearing LLE Partial Weight Bearing Percentage or Pounds: 50    Mobility  Bed Mobility Overal bed mobility: Needs Assistance Bed Mobility: Sit to Supine     Supine to sit: Min assist     General bed mobility comments: needs a hand hodl to pull self forward into trunk flexion and pivot to EOB   Transfers Overall transfer level: Needs assistance Equipment used: Rolling walker (2 wheeled) Transfers: Sit to/from Stand Sit to Stand: Supervision;Min assist;From elevated surface         General transfer comment: minA from standard height,  supervision from elevated surface   Ambulation/Gait Ambulation/Gait assistance: Min guard Ambulation Distance (Feet): 80 Feet Assistive device: Rolling walker (2 wheeled)   Gait velocity: 0.34m/s  Gait velocity interpretation: <1.31 ft/sec, indicative of household ambulator General Gait Details: 3-point step-to patterns, observes PWB well.    Stairs Stairs: Yes Stairs assistance: Min guard Stair Management: No rails;Backwards;With walker Number of Stairs: 2 General stair comments: family in attendance and educated as well   Wheelchair Mobility    Modified Rankin (Stroke Patients Only)       Balance Overall balance assessment: Modified Independent;Mild deficits observed, not formally tested                                          Cognition Arousal/Alertness: Awake/alert Behavior During Therapy: WFL for tasks assessed/performed Overall Cognitive Status: Within Functional Limits for tasks assessed                                        Exercises Total Joint Exercises Quad Sets: AROM;Left;10 reps;Supine Heel Slides: AAROM;Left;10 reps;Supine(3sH stretch) Hip ABduction/ADduction: AAROM;10 reps;Left;Supine Goniometric ROM: Left knee extension ROM: 20-77 degrees     General Comments        Pertinent Vitals/Pain Pain Assessment: 0-10 Pain Score: 5  Pain Location: L knee Pain Descriptors / Indicators: Operative site guarding Pain Intervention(s): Limited activity within patient's tolerance;Monitored during  session;Premedicated before session    Home Living                      Prior Function            PT Goals (current goals can now be found in the care plan section) Acute Rehab PT Goals Patient Stated Goal: To return home and back to work as soon as possible. PT Goal Formulation: With patient/family Time For Goal Achievement: 07/25/17 Potential to Achieve Goals: Good Progress towards PT goals: Progressing toward  goals    Frequency    BID      PT Plan Current plan remains appropriate;Discharge plan needs to be updated    Co-evaluation              AM-PAC PT "6 Clicks" Daily Activity  Outcome Measure  Difficulty turning over in bed (including adjusting bedclothes, sheets and blankets)?: A Little Difficulty moving from lying on back to sitting on the side of the bed? : A Little Difficulty sitting down on and standing up from a chair with arms (e.g., wheelchair, bedside commode, etc,.)?: A Lot Help needed moving to and from a bed to chair (including a wheelchair)?: A Little Help needed walking in hospital room?: A Little Help needed climbing 3-5 steps with a railing? : A Lot 6 Click Score: 16    End of Session Equipment Utilized During Treatment: Gait belt;Left knee immobilizer Activity Tolerance: Patient limited by fatigue Patient left: in chair;with call bell/phone within reach;with family/visitor present;with nursing/sitter in room Nurse Communication: Mobility status PT Visit Diagnosis: Muscle weakness (generalized) (M62.81);Pain Pain - Right/Left: Left Pain - part of body: Knee     Time: 6283-1517 PT Time Calculation (min) (ACUTE ONLY): 51 min  Charges:  $Gait Training: 8-22 mins $Therapeutic Exercise: 8-22 mins $Therapeutic Activity: 8-22 mins                    G Codes:       10:52 AM, 2017-07-27 Etta Grandchild, PT, DPT Physical Therapist - [facility]  [phone] ([place])     Erik Collins C 07-27-17, 10:52 AM

## 2017-07-12 NOTE — Care Management Note (Signed)
Case Management Note  Patient Details  Name: Erik Collins MRN: [id number] Date of Birth: [birth year]  Subjective/Objective:   Spoke with patient. He received his walker. He states that Dr. Barbaraann Cao came in and suggested he go home with home health. Patient is requesting home health PT. Offered choice of home care providers. Referral to [facility] for PT. He will discharge home on ASA.                  Action/Plan: Discharging today with [facility] for PT  Expected Discharge Date:  07/12/17               Expected Discharge Plan:  [name]  In-House Referral:     Discharge planning Services  CM Consult  Post Acute Care Choice:  Durable Medical Equipment Choice offered to:  Patient  DME Arranged:  Walker rolling DME Agency:  [place]:  PT [place] Agency:  [facility]  Status of Service:  Completed, signed off  If discussed at [employer] of Stay Meetings, dates discussed:    Additional Comments:  Jolly Mango, RN 07/12/2017, 10:50 AM

## 2017-07-12 NOTE — Progress Notes (Signed)
PHARMACIST - PHYSICIAN COMMUNICATION  CONCERNING: IV to Oral Route Change Policy  RECOMMENDATION: This patient is receiving thiamine by the intravenous route.  Based on criteria approved by the Pharmacy and Therapeutics Committee, the intravenous medication(s) is/are being converted to the equivalent oral dose form(s).   DESCRIPTION: These criteria include:  The patient is eating (either orally or via tube) and/or has been taking other orally administered medications for a least 24 hours  The patient has no evidence of active gastrointestinal bleeding or impaired GI absorption (gastrectomy, short bowel, patient on TNA or NPO).  If you have questions about this conversion, please contact the Pharmacy Department  []   [phone] )  [facility] [x]   [phone] )  [facility] []   [phone] )  [facility] []   [phone] )  [facility] []   [phone] )  [place], [facility] 07/12/2017 10:27 AM

## 2017-07-12 NOTE — Plan of Care (Signed)
  Problem: Education: Goal: Knowledge of General Education information will improve Outcome: Progressing   Problem: Health Behavior/Discharge Planning: Goal: Ability to manage health-related needs will improve Outcome: Progressing   Problem: Clinical Measurements: Goal: Ability to maintain clinical measurements within normal limits will improve Outcome: Progressing Goal: Will remain free from infection Outcome: Progressing Goal: Diagnostic test results will improve Outcome: Progressing Goal: Respiratory complications will improve Outcome: Progressing Goal: Cardiovascular complication will be avoided Outcome: Progressing   Problem: Coping: Goal: Level of anxiety will decrease Outcome: Progressing   Problem: Nutrition: Goal: Adequate nutrition will be maintained Outcome: Progressing   Problem: Elimination: Goal: Will not experience complications related to bowel motility Outcome: Progressing Goal: Will not experience complications related to urinary retention Outcome: Progressing

## 2017-07-12 NOTE — Progress Notes (Signed)
Patient discharging home with home health. IV removed. Instructions and prescriptions given to patient and family, verbalized understanding. Family transporting patient home.

## 2017-07-12 NOTE — Progress Notes (Signed)
Subjective:  POD # 4 s/p left TKA.  Patient reports left knee pain as mild to moderate.  Patient denies any chest pain or shortness of breath.  He has been up with physical therapy and was able to do stairs.  His family is at the bedside.  He has been cleared to go home by medicine and cardiology.  Objective:   VITALS:   Vitals:   07/11/17 1521 07/12/17 0000 07/12/17 0826 07/12/17 1035  BP: 140/65 140/67 (!) 142/71   Pulse: 65 67 63 76  Resp:  17 18   Temp: 99.3 F (37.4 C) 100 F (37.8 C) 98.8 F (37.1 C)   TempSrc: Oral Oral    SpO2: 98% 95% 95%   Weight:      Height:        PHYSICAL EXAM: Left lower extremity: Neurovascular intact Sensation intact distally Intact pulses distally Dorsiflexion/Plantar flexion intact Incision: dressing C/D/I No cellulitis present Compartment soft  LABS  Results for orders placed or performed during the hospital encounter of 07/08/17 (from the past 24 hour(s))  Glucose, capillary     Status: Abnormal   Collection Time: 07/11/17 11:22 AM  Result Value Ref Range   Glucose-Capillary 150 (H) 65 - 99 mg/dL  Glucose, capillary     Status: Abnormal   Collection Time: 07/11/17  3:27 PM  Result Value Ref Range   Glucose-Capillary 124 (H) 65 - 99 mg/dL   Comment 1 Notify RN   Glucose, capillary     Status: Abnormal   Collection Time: 07/11/17  4:52 PM  Result Value Ref Range   Glucose-Capillary 143 (H) 65 - 99 mg/dL   Comment 1 Notify RN   Glucose, capillary     Status: Abnormal   Collection Time: 07/11/17  9:08 PM  Result Value Ref Range   Glucose-Capillary 180 (H) 65 - 99 mg/dL  Glucose, capillary     Status: Abnormal   Collection Time: 07/12/17  8:23 AM  Result Value Ref Range   Glucose-Capillary 151 (H) 65 - 99 mg/dL   Comment 1 Notify RN     Dg Chest 1 View  Result Date: 07/12/2017 CLINICAL DATA:  Fever. EXAM: CHEST  1 VIEW COMPARISON:  Radiograph of Jul 10, 2017. FINDINGS: The heart size and mediastinal contours are within  normal limits. Status post coronary bypass graft. No pneumothorax is noted. Right lung is clear. Mild left basilar subsegmental atelectasis is noted with minimal left pleural effusion. The visualized skeletal structures are unremarkable. IMPRESSION: Mild left basilar subsegmental atelectasis is noted with minimal left pleural effusion. Electronically Signed   By: Marijo Conception, M.D.   On: 07/12/2017 08:03    Assessment/Plan: 2 Days Post-Op   Active Problems:   Total knee replacement status  Patient has been recommended for home health PT.  Order has been placed for this.  Patient will be partial weightbearing on the left lower extremity.  He will wear his knee immobilizer at night and when he is standing or ambulating.  Walker for assistance with ambulation.  He should take enteric-coated aspirin 325 mg p.o. twice daily for DVT prophylaxis.  Patient may use the Polar Care for comfort.  He will continue use of his TED stockings until follow-up with Dr. Sabra Heck.  He may remove the stockings at night.  Patient should continue physical therapy for lower extremity strengthening and knee range of motion.  He is due to follow-up with Dr. Sabra Heck on June 3.    [place] ,  MD 07/12/2017, 10:44 AM

## 2017-07-20 ENCOUNTER — Telehealth: Payer: Self-pay | Admitting: Licensed Clinical Social Worker

## 2017-07-20 NOTE — Telephone Encounter (Signed)
EMMI flagged patient for answering yes to loss of interest in things. Clinical Education officer, museum (CSW) was able to contact patient via telephone. Patient reported that he is doing good and making progress with home health PT through [facility]. Per patient he went to his PCP today. Patient reported that he did not mean to answer yes to that question and has no questions or concerns.   [employer], LCSW [phone]

## 2017-09-04 IMAGING — MR MR HEAD W/O CM
7 of 12 series · 27 of 48 positions shown · non-contrast
Comparison: None.

CLINICAL DATA: Optic disc edema. Peripheral vision loss in the
right eye noted 2 weeks ago

EXAM:
MRI HEAD AND ORBITS WITHOUT CONTRAST
TECHNIQUE: Multiplanar, multiecho pulse sequences of the brain and surrounding
structures were obtained without intravenous contrast. Multiplanar,
multiecho pulse sequences of the orbits and surrounding structures
were obtained including fat saturation techniques, without
intravenous contrast administration.

[Series 4: DWI · axial · 4.0mm · 0.94mm/px · z∈[-30,+137]mm · 5 of 45 slices shown]
[im 1/45]
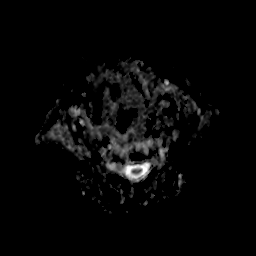
[im 12/45]
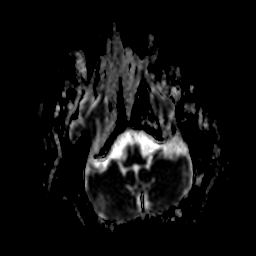
[im 23/45]
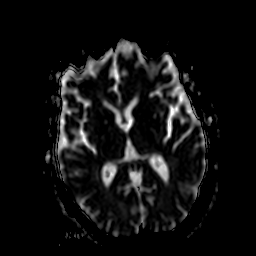
[im 34/45]
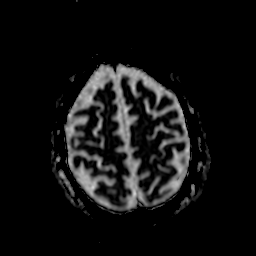
[im 45/45]
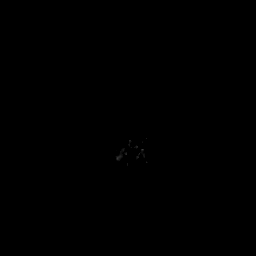

[Series 5: T2 · axial · 5.0mm · 0.45mm/px · z∈[-36,+136]mm · 4 of 29 slices shown (1 of 3)]
[im 1/29]
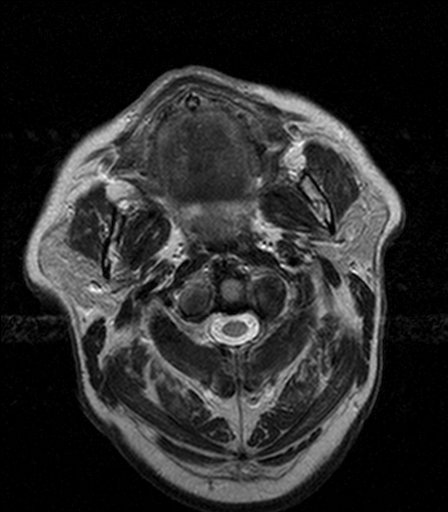
[im 10/29]
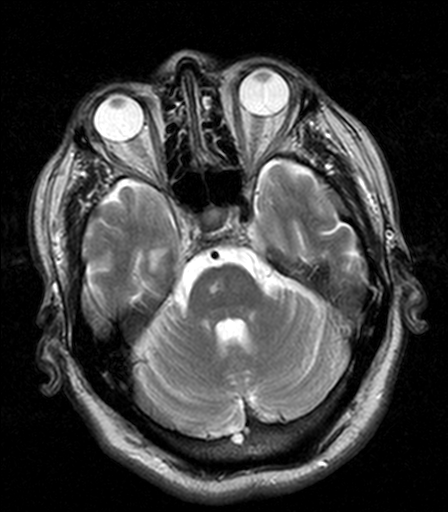
[im 19/29]
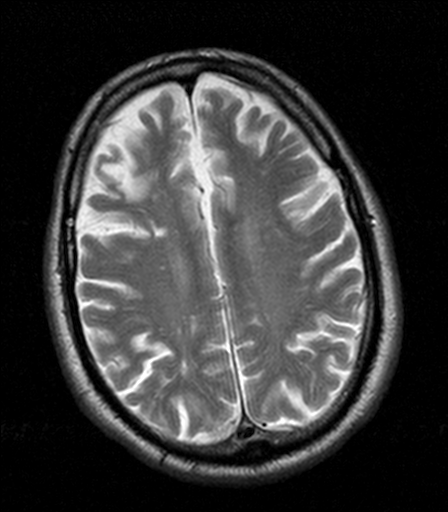
[im 29/29]
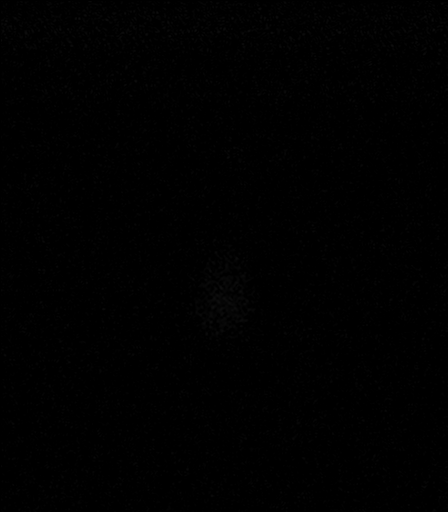

[Series 6: FLAIR · axial · 5.0mm · 0.90mm/px · z∈[-36,+136]mm · 4 of 29 slices shown]
[im 1/29]
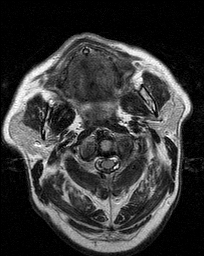
[im 10/29]
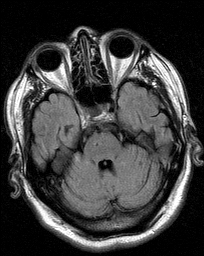
[im 19/29]
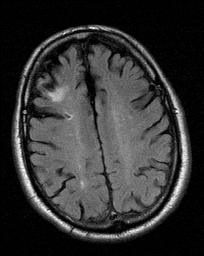
[im 29/29]
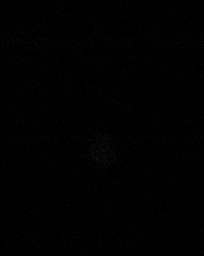

[Series 7: T2 · axial · 5.0mm · 0.45mm/px · z∈[-36,+136]mm · 4 of 29 slices shown (2 of 3)]
[im 1/29]
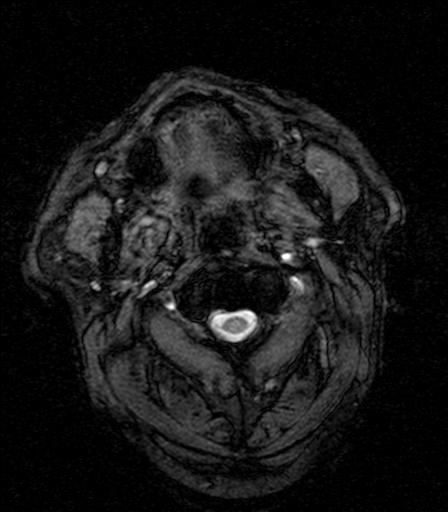
[im 10/29]
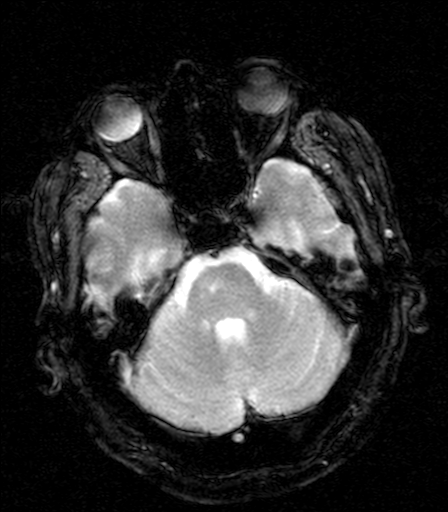
[im 19/29]
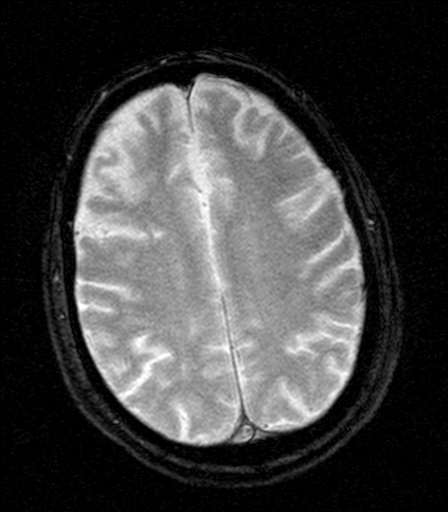
[im 29/29]
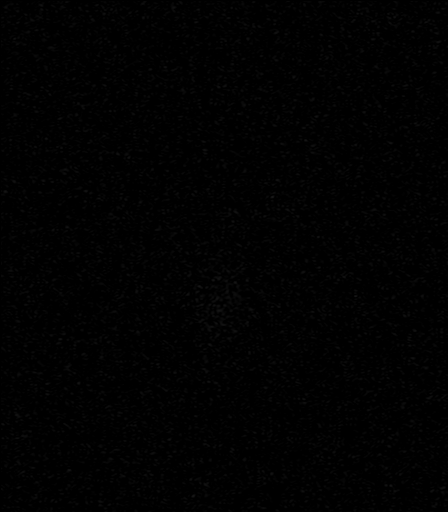

[Series 10: T2 · coronal · 5.0mm · 0.45mm/px · 4 of 29 slices shown (3 of 3)]
[im 1/29]
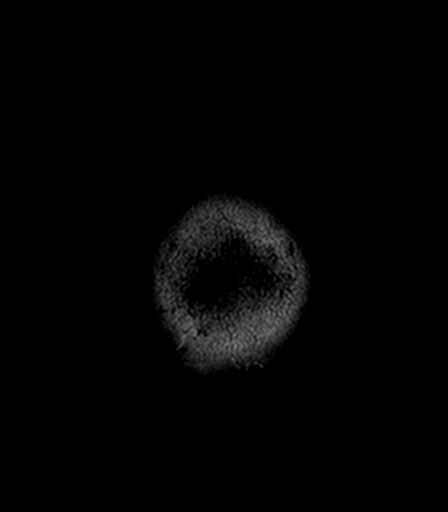
[im 10/29]
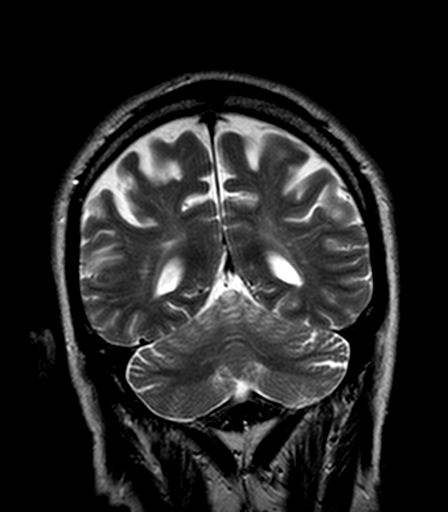
[im 19/29]
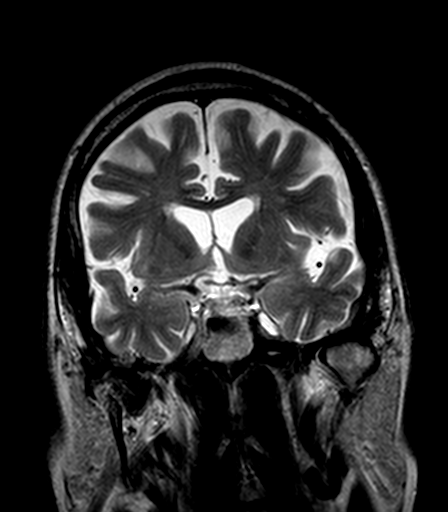
[im 29/29]
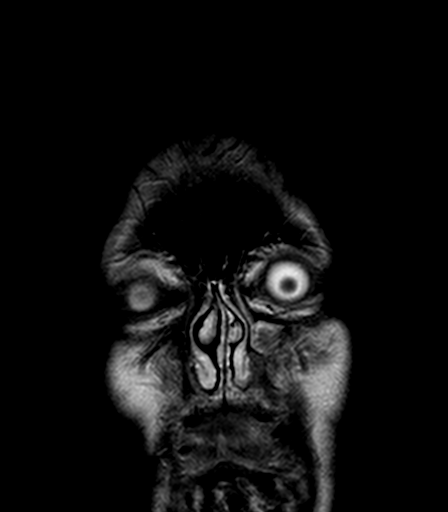

[Series 11: T2 fat-sat · axial · 3.0mm · 0.35mm/px · z∈[-11,+39]mm · 2 of 17 slices shown (1 of 2)]
[im 1/17]
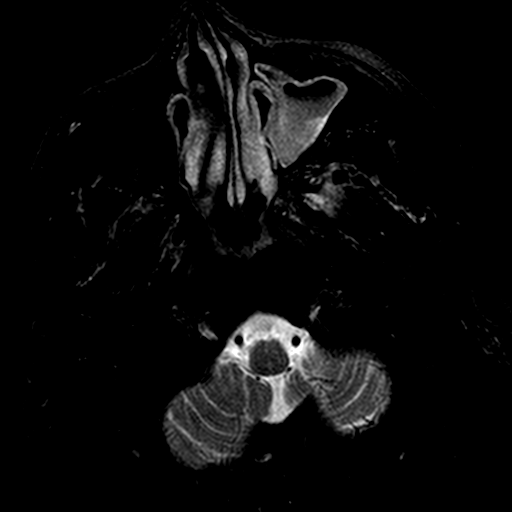
[im 17/17]
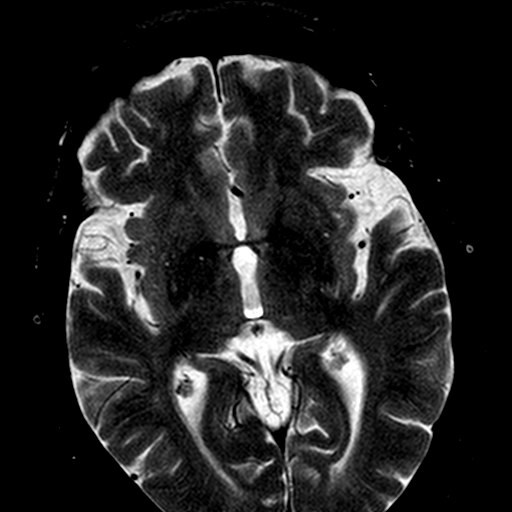

[Series 12: T2 fat-sat · coronal · 3.0mm · 0.35mm/px · 4 of 31 slices shown (2 of 2)]
[im 1/31]
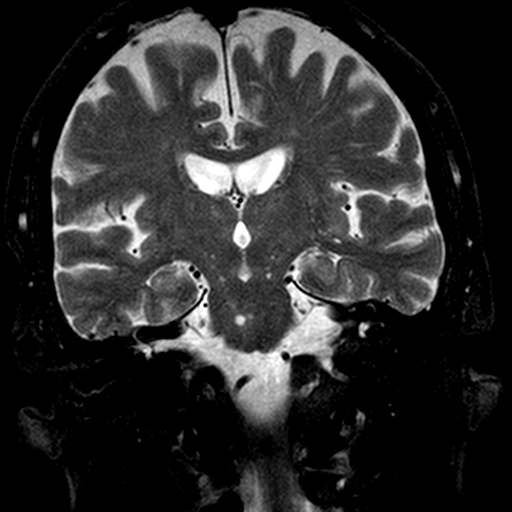
[im 11/31]
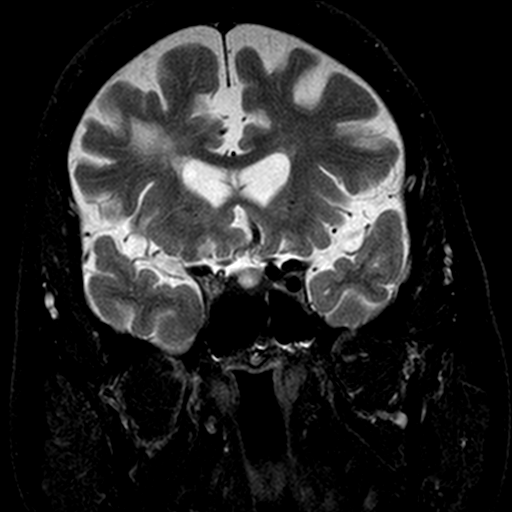
[im 21/31]
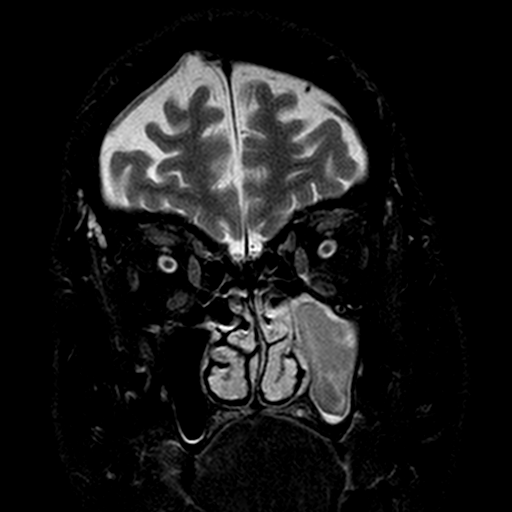
[im 31/31]
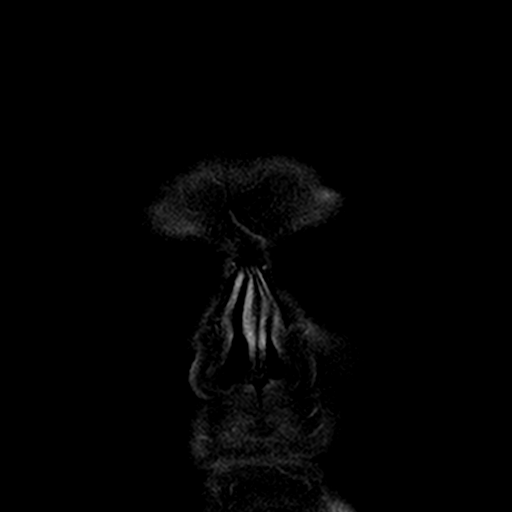

[27 of 48 positions shown; findings below may reference images not displayed]

FINDINGS: MRI HEAD FINDINGS

Calvarium and upper cervical spine: No focal marrow signal
abnormality.

Orbits: See below

Sinuses and Mastoids: Left maxillary sinusitis with fluid level.

Brain: Loss of normal flow related signal in the cervical and
skullbase ICA on the right with collapsed appearance in the upper
neck suggesting chronicity. There is a small right frontal infarct
but is remote. The supraclinoid appears reconstituted. Remote small
lacunar infarct in the right pons.

No acute infarct, hemorrhage, hydrocephalus, or mass lesion. No
findings along the optic radiations or occipital cortex.

MRI ORBITS FINDINGS

Symmetric appearance of the optic nerves without evidence of edema,
swelling, or asymmetric atrophy. Suprasellar cistern is clear.
Normal appearance of the globes with no imaging correlate for
reported papilledema. Normal appearance of the extraocular muscles.
No asymmetry of the orbital veins. No evidence of mass lesion or
inflammation.

Left maxillary sinusitis with near complete opacification. Fluid
level present.
IMPRESSION: 1. Negative MRI of the orbits. No cerebral finding to explain visual
loss.
2. Right cervical and skullbase ICA occlusion with chronic
appearance. There is a remote small right frontal infarct.
3. Left maxillary sinusitis with acute features.

## 2018-10-04 ENCOUNTER — Encounter (INDEPENDENT_AMBULATORY_CARE_PROVIDER_SITE_OTHER): Payer: Medicare Other

## 2018-10-04 ENCOUNTER — Ambulatory Visit (INDEPENDENT_AMBULATORY_CARE_PROVIDER_SITE_OTHER): Payer: Medicare Other | Admitting: Vascular Surgery

## 2018-10-07 ENCOUNTER — Ambulatory Visit (INDEPENDENT_AMBULATORY_CARE_PROVIDER_SITE_OTHER): Payer: Medicare Other

## 2018-10-07 ENCOUNTER — Other Ambulatory Visit: Payer: Self-pay

## 2018-10-07 ENCOUNTER — Ambulatory Visit (INDEPENDENT_AMBULATORY_CARE_PROVIDER_SITE_OTHER): Payer: Medicare Other | Admitting: Nurse Practitioner

## 2018-10-07 ENCOUNTER — Encounter (INDEPENDENT_AMBULATORY_CARE_PROVIDER_SITE_OTHER): Payer: Self-pay | Admitting: Nurse Practitioner

## 2018-10-07 VITALS — BP 157/55 | HR 66 | Resp 16 | Wt 191.4 lb

## 2018-10-07 DIAGNOSIS — I6523 Occlusion and stenosis of bilateral carotid arteries: Secondary | ICD-10-CM

## 2018-10-07 DIAGNOSIS — E1151 Type 2 diabetes mellitus with diabetic peripheral angiopathy without gangrene: Secondary | ICD-10-CM | POA: Diagnosis not present

## 2018-10-07 DIAGNOSIS — E119 Type 2 diabetes mellitus without complications: Secondary | ICD-10-CM | POA: Insufficient documentation

## 2018-10-07 DIAGNOSIS — M1990 Unspecified osteoarthritis, unspecified site: Secondary | ICD-10-CM | POA: Insufficient documentation

## 2018-10-07 DIAGNOSIS — E782 Mixed hyperlipidemia: Secondary | ICD-10-CM | POA: Diagnosis not present

## 2018-10-07 DIAGNOSIS — I2510 Atherosclerotic heart disease of native coronary artery without angina pectoris: Secondary | ICD-10-CM | POA: Insufficient documentation

## 2018-10-07 DIAGNOSIS — J449 Chronic obstructive pulmonary disease, unspecified: Secondary | ICD-10-CM | POA: Insufficient documentation

## 2018-10-07 DIAGNOSIS — I509 Heart failure, unspecified: Secondary | ICD-10-CM | POA: Insufficient documentation

## 2018-10-07 NOTE — Progress Notes (Signed)
SUBJECTIVE:  Patient ID: Erik Collins, male    DOB: [birth year], 73 y.o.   MRN: [id number] Chief Complaint  Patient presents with  . Follow-up    ultrasound follow up    HPI  Erik Collins is a 73 y.o. male The patient is seen for follow up evaluation of carotid stenosis. The carotid stenosis followed by ultrasound.   The patient denies amaurosis fugax. There is no recent history of TIA symptoms or focal motor deficits. There is no prior documented CVA.  The patient is taking enteric-coated aspirin 81 mg daily.  There is no history of migraine headaches. There is no history of seizures.  The patient has a history of coronary artery disease, no recent episodes of angina or shortness of breath. The patient denies PAD or claudication symptoms. There is a history of hyperlipidemia which is being treated with a statin.    Carotid Duplex done today shows a known right carotid occlusion, left with velocities from 1-39%.  No change compared to last study in 10/07/2018  Past Medical History:  Diagnosis Date  . Arthritis   . Cancer ([place])    hx lung cancer  . CHF (congestive heart failure) ([place])   . COPD (chronic obstructive pulmonary disease) ([place])   . Coronary artery disease    s/p CABG  . Diabetes mellitus without complication ([place])   . GERD (gastroesophageal reflux disease)   . Hypercholesterolemia   . Hypertension   . Lung cancer ([place])   . Myocardial infarction [facility])    09/2009 f/up with Dr. Neoma Laming  . Sleep apnea     Past Surgical History:  Procedure Laterality Date  . BACK SURGERY    . COLONOSCOPY WITH PROPOFOL N/A 11/09/2014   Procedure: COLONOSCOPY WITH PROPOFOL;  Surgeon: Josefine Class, MD;  Location: [facility] ENDOSCOPY;  Service: Endoscopy;  Laterality: N/A;  . CORONARY ANGIOPLASTY    . CORONARY ARTERY BYPASS GRAFT  09/2009   quadruple, done at [facility]  . ESOPHAGOGASTRODUODENOSCOPY (EGD) WITH PROPOFOL N/A 11/09/2014   Procedure: ESOPHAGOGASTRODUODENOSCOPY (EGD)  WITH PROPOFOL;  Surgeon: Josefine Class, MD;  Location: [facility] ENDOSCOPY;  Service: Endoscopy;  Laterality: N/A;  . left lower lobectomy  09/2009   at [facility]  . LUMBAR LAMINECTOMY/DECOMPRESSION MICRODISCECTOMY  12/17/2011   Procedure: LUMBAR LAMINECTOMY/DECOMPRESSION MICRODISCECTOMY 1 LEVEL;  Surgeon: Eustace Moore, MD;  Location: [place] NEURO ORS;  Service: Neurosurgery;  Laterality: Right;  Right Lumbar five-sacral one microdiscectomy  . TEMPORARY PACEMAKER Right 07/10/2017   Procedure: TEMPORARY PACEMAKER;  Surgeon: Isaias Cowman, MD;  Location: [place] CV LAB;  Service: Cardiovascular;  Laterality: Right;  . TOTAL KNEE ARTHROPLASTY Left 07/08/2017   Procedure: TOTAL KNEE ARTHROPLASTY;  Surgeon: Earnestine Leys, MD;  Location: [facility] ORS;  Service: Orthopedics;  Laterality: Left;    Social History   Socioeconomic History  . Marital status: Married    Spouse name: Not on file  . Number of children: Not on file  . Years of education: Not on file  . Highest education level: Not on file  Occupational History  . Not on file  Social Needs  . Financial resource strain: Not on file  . Food insecurity    Worry: Not on file    Inability: Not on file  . Transportation needs    Medical: Not on file    Non-medical: Not on file  Tobacco Use  . Smoking status: Former Smoker    Quit date: 09/19/2009    Years since quitting:  9.0  . Smokeless tobacco: Never Used  Substance and Sexual Activity  . Alcohol use: No  . Drug use: No  . Sexual activity: Not on file  Lifestyle  . Physical activity    Days per week: Not on file    Minutes per session: Not on file  . Stress: Not on file  Relationships  . Social Herbalist on phone: Not on file    Gets together: Not on file    Attends religious service: Not on file    Active member of club or organization: Not on file    Attends meetings of clubs or organizations: Not on file    Relationship status: Not on file  . Intimate  partner violence    Fear of current or ex partner: Not on file    Emotionally abused: Not on file    Physically abused: Not on file    Forced sexual activity: Not on file  Other Topics Concern  . Not on file  Social History Narrative  . Not on file    Family History  Problem Relation Age of Onset  . Heart disease Mother   . Diabetes Mother   . Heart disease Father   . Stroke Father   . Heart attack Father     No Known Allergies   Review of Systems   Review of Systems: Negative Unless Checked Constitutional: [] Weight loss  [] Fever  [] Chills Cardiac: [] Chest pain   []  Atrial Fibrillation  [] Palpitations   [] Shortness of breath when laying flat   [] Shortness of breath with exertion. [] Shortness of breath at rest Vascular:  [] Pain in legs with walking   [] Pain in legs with standing [] Pain in legs when laying flat   [] Claudication    [] Pain in feet when laying flat    [] History of DVT   [] Phlebitis   [] Swelling in legs   [] Varicose veins   [] Non-healing ulcers Pulmonary:   [] Uses home oxygen   [] Productive cough   [] Hemoptysis   [] Wheeze  [x] COPD   [] Asthma Neurologic:  [] Dizziness   [] Seizures  [] Blackouts [] History of stroke   [] History of TIA  [] Aphasia   [] Temporary Blindness   [] Weakness or numbness in arm   [] Weakness or numbness in leg Musculoskeletal:   [] Joint swelling   [] Joint pain   [] Low back pain  [x]  History of Knee Replacement [x] Arthritis [] back Surgeries  []  Spinal Stenosis    Hematologic:  [] Easy bruising  [] Easy bleeding   [] Hypercoagulable state   [] Anemic Gastrointestinal:  [] Diarrhea   [] Vomiting  [] Gastroesophageal reflux/heartburn   [] Difficulty swallowing. [] Abdominal pain Genitourinary:  [] Chronic kidney disease   [] Difficult urination  [] Anuric   [] Blood in urine [] Frequent urination  [] Burning with urination   [] Hematuria Skin:  [] Rashes   [] Ulcers [] Wounds Psychological:  [] History of anxiety   []  History of major depression  []  Memory Difficulties       OBJECTIVE:   Physical Exam  BP (!) 157/55 (BP Location: Right Arm)   Pulse 66   Resp 16   Wt 191 lb 6.4 oz (86.8 kg)   BMI 26.69 kg/m   Gen: WD/WN, NAD Head: [place]/AT, No temporalis wasting.  Ear/Nose/Throat: Hearing grossly intact, nares w/o erythema or drainage Eyes: PER, EOMI, sclera nonicteric.  Neck: Supple, no masses.  No JVD.  Pulmonary:  Good air movement, no use of accessory muscles.  Cardiac: RRR Vascular:  Vessel Right Left  Radial Palpable Palpable   Gastrointestinal: soft, non-distended. No  guarding/no peritoneal signs.  Musculoskeletal: M/S 5/5 throughout.  No deformity or atrophy.  Neurologic: Pain and light touch intact in extremities.  Symmetrical.  Speech is fluent. Motor exam as listed above. Psychiatric: Judgment intact, Mood & affect appropriate for pt's clinical situation. Dermatologic: No Venous rashes. No Ulcers Noted.  No changes consistent with cellulitis. Lymph : No Cervical lymphadenopathy, no lichenification or skin changes of chronic lymphedema.       ASSESSMENT AND PLAN:  1. Bilateral carotid artery stenosis Recommend:  Given the patient's asymptomatic subcritical stenosis no further invasive testing or surgery at this time.  Carotid Duplex done today shows a known right carotid occlusion, left with velocities from 1-39%.  No change compared to last study in 10/07/2018  Continue antiplatelet therapy as prescribed Continue management of CAD, HTN and Hyperlipidemia Healthy heart diet,  encouraged exercise at least 4 times per week Follow up in 24 months with duplex ultrasound and physical exam   2. Type 2 diabetes mellitus with diabetic peripheral angiopathy without gangrene, without long-term current use of insulin (HCC) Continue hypoglycemic medications as already ordered, these medications have been reviewed and there are no changes at this time.  Hgb A1C to be monitored as already arranged by primary service   3. Mixed hyperlipidemia  Continue statin as ordered and reviewed, no changes at this time    Current Outpatient Medications on File Prior to Visit  Medication Sig Dispense Refill  . acetaminophen (TYLENOL 8 HOUR ARTHRITIS PAIN) 650 MG CR tablet Take 1,300 mg by mouth 2 (two) times daily.    [name] aspirin EC 81 MG tablet Take 81 mg by mouth daily.    . carvedilol (COREG) 12.5 MG tablet     . empagliflozin (JARDIANCE) 10 MG TABS tablet Take 10 mg by mouth daily.    [name] ezetimibe (ZETIA) 10 MG tablet Take 10 mg by mouth daily.    [name] gabapentin (NEURONTIN) 300 MG capsule     . Insulin Glargine (TOUJEO MAX SOLOSTAR) 300 UNIT/ML SOPN Inject 0-40 Units into the skin See admin instructions. Sliding scale insulin per patient  3 units if blood sugar is greater than 150 during the day, if blood sugars are less= no insulin 40 units if blood sugar is greater than 150 at night, if blood sugars are less=no insulin    . meloxicam (MOBIC) 15 MG tablet     . metFORMIN (GLUCOPHAGE) 1000 MG tablet Take 1,000 mg by mouth 2 (two) times daily.    . Omega-3 Fatty Acids (FISH OIL) 1200 MG CAPS Take 1,200 mg by mouth 2 (two) times daily.    [name] omeprazole (PRILOSEC) 20 MG capsule Take 20 mg by mouth daily.    . vitamin B-12 (CYANOCOBALAMIN) 500 MCG tablet Take 500 mcg by mouth daily.    . clonazePAM (KLONOPIN) 1 MG tablet Take 1 mg by mouth at bedtime. For sleep    . famotidine (PEPCID) 20 MG tablet Take 1 tablet (20 mg total) by mouth 2 (two) times daily. 60 tablet 1  . lisinopril (PRINIVIL,ZESTRIL) 5 MG tablet Take 1 tablet (5 mg total) by mouth daily. 30 tablet 0  . oxyCODONE (OXY IR/ROXICODONE) 5 MG immediate release tablet Take 1-2 tablets (5-10 mg total) by mouth every 6 (six) hours as needed for moderate pain or severe pain. (Patient not taking: Reported on 10/07/2018) 30 tablet 0  . tiZANidine (ZANAFLEX) 2 MG tablet Take 4 mg by mouth at bedtime.     No current facility-administered medications on  file prior to visit.     There are no  Patient Instructions on file for this visit. No follow-ups on file.   Kris Hartmann, NP  This note was completed with Sales executive.  Any errors are purely unintentional.

## 2018-12-31 ENCOUNTER — Emergency Department: Payer: Medicare Other

## 2018-12-31 ENCOUNTER — Other Ambulatory Visit: Payer: Self-pay

## 2018-12-31 ENCOUNTER — Emergency Department
Admission: EM | Admit: 2018-12-31 | Discharge: 2018-12-31 | Disposition: A | Discharge status: Home / Self Care | Payer: Medicare Other | Attending: Emergency Medicine | Admitting: Emergency Medicine

## 2018-12-31 ENCOUNTER — Encounter: Payer: Self-pay | Admitting: Emergency Medicine

## 2018-12-31 DIAGNOSIS — R42 Dizziness and giddiness: Secondary | ICD-10-CM | POA: Diagnosis not present

## 2018-12-31 DIAGNOSIS — Z96652 Presence of left artificial knee joint: Secondary | ICD-10-CM | POA: Insufficient documentation

## 2018-12-31 DIAGNOSIS — Z87891 Personal history of nicotine dependence: Secondary | ICD-10-CM | POA: Diagnosis not present

## 2018-12-31 DIAGNOSIS — J449 Chronic obstructive pulmonary disease, unspecified: Secondary | ICD-10-CM | POA: Diagnosis not present

## 2018-12-31 DIAGNOSIS — E119 Type 2 diabetes mellitus without complications: Secondary | ICD-10-CM | POA: Diagnosis not present

## 2018-12-31 DIAGNOSIS — I110 Hypertensive heart disease with heart failure: Secondary | ICD-10-CM | POA: Insufficient documentation

## 2018-12-31 DIAGNOSIS — Z79899 Other long term (current) drug therapy: Secondary | ICD-10-CM | POA: Diagnosis not present

## 2018-12-31 DIAGNOSIS — Z794 Long term (current) use of insulin: Secondary | ICD-10-CM | POA: Insufficient documentation

## 2018-12-31 DIAGNOSIS — I509 Heart failure, unspecified: Secondary | ICD-10-CM | POA: Insufficient documentation

## 2018-12-31 LAB — COMPREHENSIVE METABOLIC PANEL
ALT: 15 U/L (ref 0–44)
AST: 20 U/L (ref 15–41)
Albumin: 3.9 g/dL (ref 3.5–5.0)
Alkaline Phosphatase: 56 U/L (ref 38–126)
Anion gap: 9 (ref 5–15)
BUN: 23 mg/dL (ref 8–23)
CO2: 23 mmol/L (ref 22–32)
Calcium: 8.9 mg/dL (ref 8.9–10.3)
Chloride: 107 mmol/L (ref 98–111)
Creatinine, Ser: 1.02 mg/dL (ref 0.61–1.24)
GFR calc Af Amer: >60 mL/min (ref >60)
GFR calc non Af Amer: >60 mL/min (ref >60)
Glucose, Bld: 110 mg/dL — ABNORMAL HIGH (ref 70–99)
Potassium: 4.5 mmol/L (ref 3.5–5.1)
Sodium: 139 mmol/L (ref 135–145)
Total Bilirubin: 0.6 mg/dL (ref 0.3–1.2)
Total Protein: 7.5 g/dL (ref 6.5–8.1)

## 2018-12-31 LAB — DIFFERENTIAL
Abs Immature Granulocytes: 0.02 10*3/uL (ref 0.00–0.07)
Basophils Absolute: 0 10*3/uL (ref 0.0–0.1)
Basophils Relative: 0 %
Eosinophils Absolute: 0.1 10*3/uL (ref 0.0–0.5)
Eosinophils Relative: 2 %
Immature Granulocytes: 0 %
Lymphocytes Relative: 18 %
Lymphs Abs: 1.5 10*3/uL (ref 0.7–4.0)
Monocytes Absolute: 0.8 10*3/uL (ref 0.1–1.0)
Monocytes Relative: 9 %
Neutro Abs: 5.7 10*3/uL (ref 1.7–7.7)
Neutrophils Relative %: 71 %

## 2018-12-31 LAB — CBC
HCT: 37.7 % — ABNORMAL LOW (ref 39.0–52.0)
Hemoglobin: 12.3 g/dL — ABNORMAL LOW (ref 13.0–17.0)
MCH: 29.4 pg (ref 26.0–34.0)
MCHC: 32.6 g/dL (ref 30.0–36.0)
MCV: 90 fL (ref 80.0–100.0)
Platelets: 211 10*3/uL (ref 150–400)
RBC: 4.19 MIL/uL — ABNORMAL LOW (ref 4.22–5.81)
RDW: 13.3 % (ref 11.5–15.5)
WBC: 8.1 10*3/uL (ref 4.0–10.5)
nRBC: 0 % (ref 0.0–0.2)

## 2018-12-31 LAB — PROTIME-INR
INR: 1.1 (ref 0.8–1.2)
Prothrombin Time: 13.7 seconds (ref 11.4–15.2)

## 2018-12-31 LAB — APTT: aPTT: 30 seconds (ref 24–36)

## 2018-12-31 MED ORDER — MECLIZINE HCL 25 MG PO TABS: 25.0000 mg | ORAL_TABLET | Freq: Three times a day (TID) | ORAL | 0 refills | Status: DC | PRN

## 2018-12-31 MED ORDER — MECLIZINE HCL 25 MG PO TABS
25.0000 mg | ORAL_TABLET | Freq: Once | ORAL | Status: AC
  Administered 2018-12-31: 25 mg via ORAL
  Filled 2018-12-31: qty 1

## 2018-12-31 NOTE — ED Notes (Signed)
Pt states dizziness upon waking. Pt denies any other symptoms including chest pain. Pt states cough is normal.

## 2018-12-31 NOTE — ED Triage Notes (Signed)
Pt reports was dizzy when he got up this morning. Pt denies pain. Pt went to sleep last night at 10:15. Pt reports when walking he feels like he will lose his balance.

## 2018-12-31 NOTE — ED Provider Notes (Signed)
[facility] Emergency Department Provider Note ____________________________________________   First MD Initiated Contact with Patient 12/31/18 1049     (approximate)  I have reviewed the triage vital signs and the nursing notes.   HISTORY  Chief Complaint Dizziness    HPI Erik Collins is a 73 y.o. male with PMH as noted below who presents with dizziness, acute onset when he got up this morning, and described as a sensation of movement or feeling off of balance.  He states that he had a similar episode before which was diagnosed as vertigo.  He does not remember what he took for it.  The patient denies any nausea or vomiting.  He had a mild headache earlier which has resolved.  He has no chest pain, shortness of breath, fever, or weakness.  He denies any head trauma.  Past Medical History:  Diagnosis Date  . Arthritis   . Cancer ([place])    hx lung cancer  . CHF (congestive heart failure) ([place])   . COPD (chronic obstructive pulmonary disease) ([place])   . Coronary artery disease    s/p CABG  . Diabetes mellitus without complication ([place])   . GERD (gastroesophageal reflux disease)   . Hypercholesterolemia   . Hypertension   . Lung cancer ([place])   . Myocardial infarction [facility])    09/2009 f/up with Dr. Neoma Laming  . Sleep apnea     Patient Active Problem List   Diagnosis Date Noted  . Congestive heart failure ([place]) 10/07/2018  . COPD (chronic obstructive pulmonary disease) ([place]) 10/07/2018  . Coronary disease 10/07/2018  . Diabetes mellitus type 2, uncomplicated ([place]) 07/37/1062  . Osteoarthritis 10/07/2018  . 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 Carotid stenosis 10/05/2016  . Essential hypertension 10/05/2016  . Hyperlipidemia 10/05/2016  . Diabetes ([place]) 10/05/2016    Past Surgical History:  Procedure Laterality Date  . BACK SURGERY    . COLONOSCOPY WITH PROPOFOL N/A 11/09/2014   Procedure: COLONOSCOPY WITH PROPOFOL;  Surgeon: Josefine Class, MD;  Location: [facility] ENDOSCOPY;  Service: Endoscopy;  Laterality: N/A;  . CORONARY ANGIOPLASTY    . CORONARY ARTERY BYPASS GRAFT  09/2009   quadruple, done at [facility]  . ESOPHAGOGASTRODUODENOSCOPY (EGD) WITH PROPOFOL N/A 11/09/2014   Procedure: ESOPHAGOGASTRODUODENOSCOPY (EGD) WITH PROPOFOL;  Surgeon: Josefine Class, MD;  Location: [facility] ENDOSCOPY;  Service: Endoscopy;  Laterality: N/A;  . left lower lobectomy  09/2009   at [facility]  . LUMBAR LAMINECTOMY/DECOMPRESSION MICRODISCECTOMY  12/17/2011   Procedure: LUMBAR LAMINECTOMY/DECOMPRESSION MICRODISCECTOMY 1 LEVEL;  Surgeon: Eustace Moore, MD;  Location: [place] NEURO ORS;  Service: Neurosurgery;  Laterality: Right;  Right Lumbar five-sacral one microdiscectomy  . TEMPORARY PACEMAKER Right 07/10/2017   Procedure: TEMPORARY PACEMAKER;  Surgeon: Isaias Cowman, MD;  Location: [place] CV LAB;  Service: Cardiovascular;  Laterality: Right;  . TOTAL KNEE ARTHROPLASTY Left 07/08/2017   Procedure: TOTAL KNEE ARTHROPLASTY;  Surgeon: Earnestine Leys, MD;  Location: [facility] ORS;  Service: Orthopedics;  Laterality: Left;    Prior to Admission medications   Medication Sig Start Date End Date Taking? Authorizing Provider  acetaminophen (TYLENOL 8 HOUR ARTHRITIS PAIN) 650 MG CR tablet Take 1,300 mg by mouth 2 (two) times daily.    [provider]  aspirin EC 81 MG tablet Take 81 mg by mouth daily.    [provider]  carvedilol (COREG) 12.5 MG tablet  08/02/18   [provider]  clonazePAM (KLONOPIN) 1 MG tablet Take 1 mg  by mouth at bedtime. For sleep    [provider]  empagliflozin (JARDIANCE) 10 MG TABS tablet Take 10 mg by mouth daily.    [provider]  ezetimibe (ZETIA) 10 MG tablet Take 10 mg by mouth daily.    [provider]  famotidine (PEPCID) 20 MG tablet Take 1 tablet (20 mg total) by mouth 2 (two) times daily. 07/12/17 07/12/18  Bettey Costa, MD  gabapentin (NEURONTIN) 300 MG capsule   10/04/18   [provider]  Insulin Glargine (TOUJEO MAX SOLOSTAR) 300 UNIT/ML SOPN Inject 0-40 Units into the skin See admin instructions. Sliding scale insulin per patient  3 units if blood sugar is greater than 150 during the day, if blood sugars are less= no insulin 40 units if blood sugar is greater than 150 at night, if blood sugars are less=no insulin    [provider]  lisinopril (PRINIVIL,ZESTRIL) 5 MG tablet Take 1 tablet (5 mg total) by mouth daily. 07/12/17 07/12/18  Bettey Costa, MD  meclizine (ANTIVERT) 25 MG tablet Take 1 tablet (25 mg total) by mouth 3 (three) times daily as needed for dizziness. 12/31/18   Arta Silence, MD  meloxicam (MOBIC) 15 MG tablet  09/27/18   [provider]  metFORMIN (GLUCOPHAGE) 1000 MG tablet Take 1,000 mg by mouth 2 (two) times daily.    [provider]  Omega-3 Fatty Acids (FISH OIL) 1200 MG CAPS Take 1,200 mg by mouth 2 (two) times daily.    [provider]  omeprazole (PRILOSEC) 20 MG capsule Take 20 mg by mouth daily.    [provider]  oxyCODONE (OXY IR/ROXICODONE) 5 MG immediate release tablet Take 1-2 tablets (5-10 mg total) by mouth every 6 (six) hours as needed for moderate pain or severe pain. Patient not taking: Reported on 10/07/2018 07/12/17   Bettey Costa, MD  tiZANidine (ZANAFLEX) 2 MG tablet Take 4 mg by mouth at bedtime.    [provider]  vitamin B-12 (CYANOCOBALAMIN) 500 MCG tablet Take 500 mcg by mouth daily.    [provider]    Allergies Patient has no known allergies.  Family History  Problem Relation Age of Onset  . Heart disease Mother   . Diabetes Mother   . Heart disease Father   . Stroke Father   . Heart attack Father     Social History Social History   Tobacco Use  . Smoking status: Former Smoker    Quit date: 09/19/2009    Years since quitting: 9.2  . Smokeless tobacco: Never Used  Substance Use Topics  . Alcohol use: No  . Drug  use: No    Review of Systems  Constitutional: No fever. Eyes: No visual changes. ENT: No sore throat. Cardiovascular: Denies chest pain. Respiratory: Denies shortness of breath. Gastrointestinal: No nausea, no vomiting.  Genitourinary: Negative for dysuria.  Musculoskeletal: Negative for back pain. Skin: Negative for rash. Neurological: Positive for resolved headache, negative for focal weakness or numbness.   ____________________________________________   PHYSICAL EXAM:  VITAL SIGNS: ED Triage Vitals  Enc Vitals Group     BP 12/31/18 0801 (!) 170/80     Pulse Rate 12/31/18 0801 (!) 53     Resp 12/31/18 0801 20     Temp 12/31/18 0805 98 F (36.7 C)     Temp Source 12/31/18 0805 Oral     SpO2 12/31/18 0801 96 %     Weight 12/31/18 0801 194 lb (88 kg)  Height 12/31/18 0801 5\' 11"  (1.803 m)     Head Circumference --      Peak Flow --      Pain Score 12/31/18 0801 0     Pain Loc --      Pain Edu? --      Excl. in [place]? --     Constitutional: Alert and oriented. Well appearing and in no acute distress. Eyes: Conjunctivae are normal.  EOMI.  PERRLA. Head: Atraumatic. Nose: No congestion/rhinnorhea. Mouth/Throat: Mucous membranes are moist.   Neck: Normal range of motion.  Cardiovascular: Normal rate, regular rhythm.Good peripheral circulation. Respiratory: Normal respiratory effort.  No retractions.  Gastrointestinal: No distention.  Musculoskeletal: No lower extremity edema.  Extremities warm and well perfused.  Neurologic:  Normal speech and language.  Motor and sensory intact in all extremities.  Normal coordination with no ataxia on finger-to-nose.  No facial droop.  No pronator drift.   Skin:  Skin is warm and dry. No rash noted. Psychiatric: Mood and affect are normal. Speech and behavior are normal.  ____________________________________________   LABS (all labs ordered are listed, but only abnormal results are displayed)  Labs Reviewed  CBC - Abnormal;  Notable for the following components:      Result Value   RBC 4.19 (*)    Hemoglobin 12.3 (*)    HCT 37.7 (*)    All other components within normal limits  COMPREHENSIVE METABOLIC PANEL - Abnormal; Notable for the following components:   Glucose, Bld 110 (*)    All other components within normal limits  DIFFERENTIAL  PROTIME-INR  APTT  I-STAT CREATININE, ED  CBG MONITORING, ED   ____________________________________________  EKG  ED ECG REPORT I, Arta Silence, the attending physician, personally viewed and interpreted this ECG.  Date: 12/31/2018 EKG Time: 0753 Rate: 55 Rhythm: normal sinus rhythm QRS Axis: normal Intervals: normal ST/T Wave abnormalities: Nonspecific abnormality V2 only, otherwise normal Narrative Interpretation: no evidence of acute ischemia; nonspecific abnormality in lead V2 with no significant change when compared to EKG of 07/10/2017 or 12/18/2011  ____________________________________________  RADIOLOGY  CT head: No acute abnormality  ____________________________________________   PROCEDURES  Procedure(s) performed: No  Procedures  Critical Care performed: No ____________________________________________   INITIAL IMPRESSION / ASSESSMENT AND PLAN / ED COURSE  Pertinent labs & imaging results that were available during my care of the patient were reviewed by me and considered in my medical decision making (see chart for details).  73 year old male with PMH as noted above presents with dizziness acute onset this morning when he got up and described as a movement or spinning sensation with problems balancing.  He states it is similar to when he had vertigo several years ago.  I reviewed the past medical records in [place].  He was most recent admitted last year for knee replacement and has no other recent ED visits or admissions.  On exam, the patient is well-appearing.  His vital signs are normal except for hypertension.  The physical  exam including thorough neurologic exam are all within normal limits.  He has no cerebellar signs or any issues with coordination.  Overall presentation is consistent with peripheral vertigo given the past history of similar episodes, the acute onset, and the association with movement and getting up this morning.  There are no findings to suggest stroke or other acute CNS cause, and no signs or symptoms to suggest cardiac or metabolic etiology.  CT head and lab work-up were obtained from triage and are  all within normal limits.  The patient states that he feels comfortable and would like to go home.  I feel that this is reasonable, and he is stable for discharge.  I will give a dose of meclizine here and a prescription for the same.  I counseled him thoroughly on return precautions, and he expressed understanding.  ____________________________________________   FINAL CLINICAL IMPRESSION(S) / ED DIAGNOSES  Final diagnoses:  Vertigo      NEW MEDICATIONS STARTED DURING THIS VISIT:  New Prescriptions   MECLIZINE (ANTIVERT) 25 MG TABLET    Take 1 tablet (25 mg total) by mouth 3 (three) times daily as needed for dizziness.     Note:  This document was prepared using Dragon voice recognition software and may include unintentional dictation errors.    Arta Silence, MD 12/31/18 1130

## 2018-12-31 NOTE — Discharge Instructions (Addendum)
Take the meclizine up to 3 times daily as needed for dizziness.  Follow-up with your regular doctor in the next 1 to 2 weeks.  Return to the ER immediately for new, worsening, or persistent severe dizziness, vertigo, nausea or vomiting, severe headache, or any other new or worsening symptoms that concern you.

## 2018-12-31 NOTE — ED Notes (Signed)
Pt verbalized understanding of discharge instructions. NAD at this time. 

## 2019-04-10 ENCOUNTER — Ambulatory Visit: Payer: Medicare Other | Attending: Internal Medicine

## 2019-04-10 DIAGNOSIS — Z23 Encounter for immunization: Secondary | ICD-10-CM | POA: Insufficient documentation

## 2019-04-10 NOTE — Progress Notes (Signed)
   Covid-19 Vaccination Clinic  Name:  Erik Collins    MRN: [id number] DOB: [birth year]  04/10/2019  [name] was observed post Covid-19 immunization for 15 minutes without incidence. He was provided with Vaccine Information Sheet and instruction to access the V-Safe system.   [name] was instructed to call 911 with any severe reactions post vaccine: [name] Difficulty breathing  . Swelling of your face and throat  . A fast heartbeat  . A bad rash all over your body  . Dizziness and weakness    Immunizations Administered    Name Date Dose VIS Date Route   Pfizer COVID-19 Vaccine 04/10/2019  8:37 AM 0.3 mL 01/28/2019 Intramuscular   Manufacturer: [place]   Lot: [id number]   [place]: [id number]

## 2019-05-03 ENCOUNTER — Ambulatory Visit: Payer: Medicare Other | Attending: Internal Medicine

## 2019-05-03 DIAGNOSIS — Z23 Encounter for immunization: Secondary | ICD-10-CM

## 2019-05-03 NOTE — Progress Notes (Signed)
   Covid-19 Vaccination Clinic  Name:  Erik Collins    MRN: [id number] DOB: 12-18-1945  05/03/2019  Erik Collins was observed post Covid-19 immunization for 15 minutes without incident. He was provided with Vaccine Information Sheet and instruction to access the V-Safe system.   Erik Collins was instructed to call 911 with any severe reactions post vaccine: [name] Difficulty breathing  . Swelling of face and throat  . A fast heartbeat  . A bad rash all over body  . Dizziness and weakness   Immunizations Administered    Name Date Dose VIS Date Route   Pfizer COVID-19 Vaccine 05/03/2019  8:32 AM 0.3 mL 01/28/2019 Intramuscular   Manufacturer: [place]   Lot: [id number]   [place]: [id number]

## 2019-06-21 IMAGING — DX DG CHEST 1V
1 series · 1 of 1 positions shown · non-contrast
Comparison: 12/17/2011

CLINICAL DATA: Pt with fever of 100.2 this morning.

EXAM:
CHEST  1 VIEW

[chest ap]
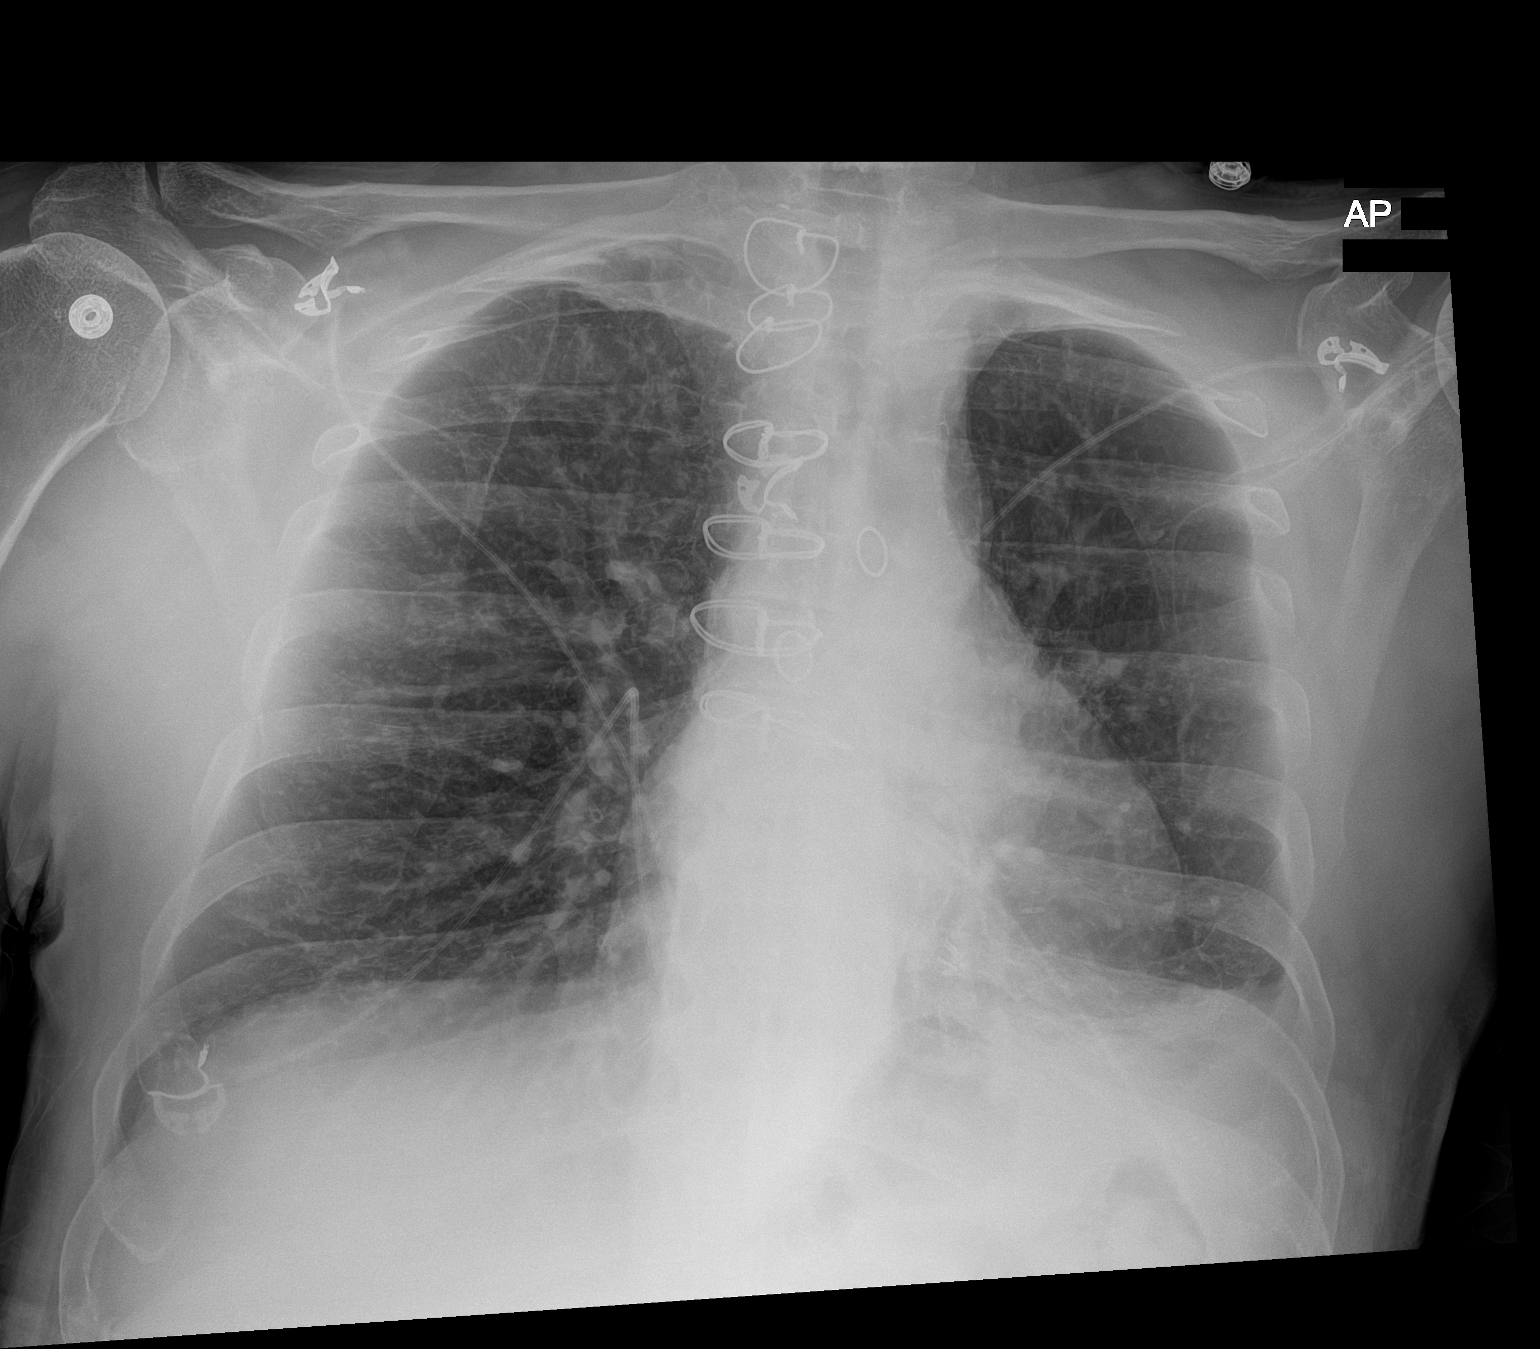

[1 of 1 positions shown; findings below may reference images not displayed]

FINDINGS: Chronic scarring at the left lung base. No new infiltrate or overt
edema.

Heart size and mediastinal contours are within normal limits.
Previous CABG.

No effusion. Chronic blunting of the left lateral costophrenic
angle. No pneumothorax.

Previous median sternotomy. Probable left carotid bifurcation
calcifications.
IMPRESSION: Chronic and postop changes.  No acute findings.

## 2019-06-23 IMAGING — DX DG CHEST 1V
1 series · 1 of 1 positions shown · non-contrast
Comparison: Radiograph July 10, 2017.

CLINICAL DATA: Fever.

EXAM:
CHEST  1 VIEW

[chest ap]
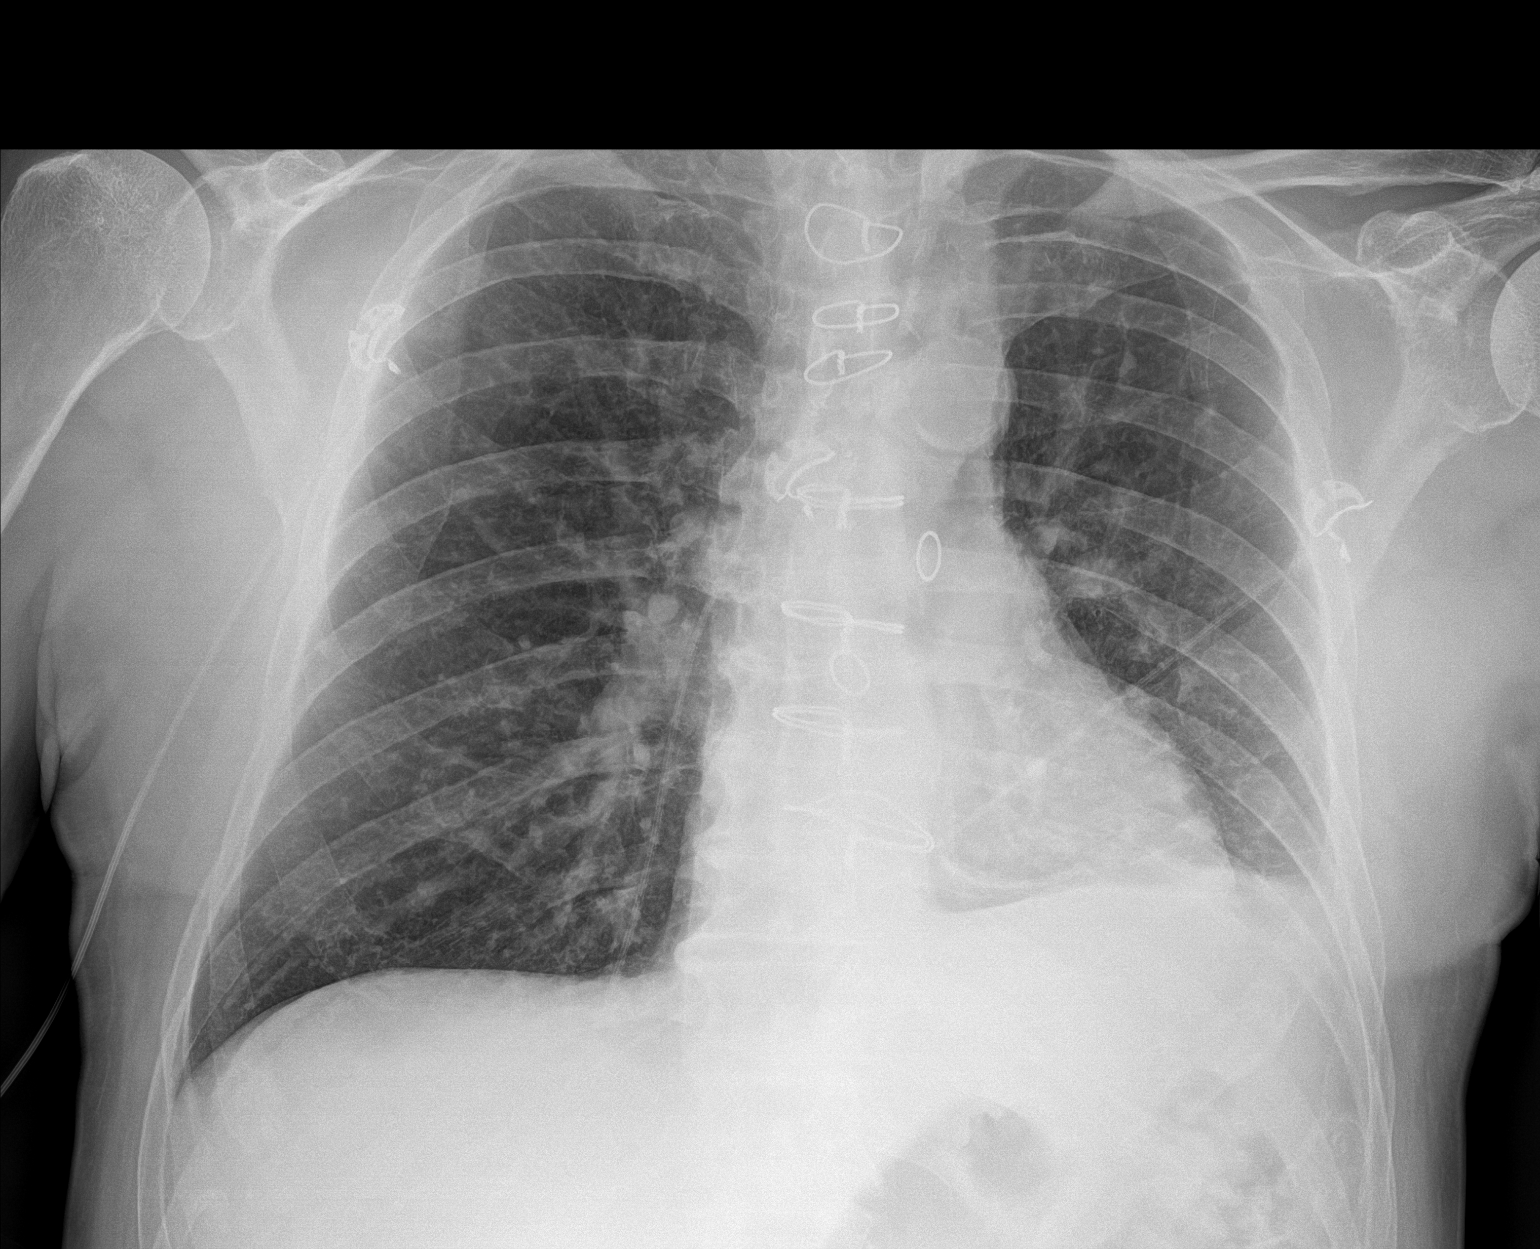

[1 of 1 positions shown; findings below may reference images not displayed]

FINDINGS: The heart size and mediastinal contours are within normal limits.
Status post coronary bypass graft. No pneumothorax is noted. Right
lung is clear. Mild left basilar subsegmental atelectasis is noted
with minimal left pleural effusion. The visualized skeletal
structures are unremarkable.
IMPRESSION: Mild left basilar subsegmental atelectasis is noted with minimal
left pleural effusion.

## 2019-11-04 ENCOUNTER — Other Ambulatory Visit: Payer: Self-pay

## 2019-11-04 ENCOUNTER — Encounter: Payer: Self-pay | Admitting: Ophthalmology

## 2019-11-07 ENCOUNTER — Other Ambulatory Visit: Payer: Self-pay

## 2019-11-07 ENCOUNTER — Other Ambulatory Visit
Admission: RE | Admit: 2019-11-07 | Discharge: 2019-11-07 | Disposition: A | Discharge status: Home / Self Care | Payer: Medicare Other | Source: Ambulatory Visit | Attending: Ophthalmology | Admitting: Ophthalmology

## 2019-11-07 DIAGNOSIS — Z01812 Encounter for preprocedural laboratory examination: Secondary | ICD-10-CM | POA: Diagnosis present

## 2019-11-07 DIAGNOSIS — Z20822 Contact with and (suspected) exposure to covid-19: Secondary | ICD-10-CM | POA: Diagnosis not present

## 2019-11-07 NOTE — Discharge Instructions (Signed)

## 2019-11-08 LAB — SARS CORONAVIRUS 2 (TAT 6-24 HRS): SARS Coronavirus 2: NEGATIVE

## 2019-11-09 ENCOUNTER — Ambulatory Visit: Payer: Medicare Other | Admitting: Anesthesiology

## 2019-11-09 ENCOUNTER — Encounter
Admission: RE | Disposition: A | Discharge status: Home / Self Care | Payer: Self-pay | Source: Home / Self Care | Attending: Ophthalmology

## 2019-11-09 ENCOUNTER — Ambulatory Visit
Admission: RE | Admit: 2019-11-09 | Discharge: 2019-11-09 | Disposition: A | Discharge status: Home / Self Care | Payer: Medicare Other | Attending: Ophthalmology | Admitting: Ophthalmology

## 2019-11-09 ENCOUNTER — Encounter: Payer: Self-pay | Admitting: Ophthalmology

## 2019-11-09 ENCOUNTER — Other Ambulatory Visit: Payer: Self-pay

## 2019-11-09 DIAGNOSIS — K219 Gastro-esophageal reflux disease without esophagitis: Secondary | ICD-10-CM | POA: Insufficient documentation

## 2019-11-09 DIAGNOSIS — J449 Chronic obstructive pulmonary disease, unspecified: Secondary | ICD-10-CM | POA: Insufficient documentation

## 2019-11-09 DIAGNOSIS — I110 Hypertensive heart disease with heart failure: Secondary | ICD-10-CM | POA: Diagnosis not present

## 2019-11-09 DIAGNOSIS — Z87891 Personal history of nicotine dependence: Secondary | ICD-10-CM | POA: Diagnosis not present

## 2019-11-09 DIAGNOSIS — H2512 Age-related nuclear cataract, left eye: Secondary | ICD-10-CM | POA: Insufficient documentation

## 2019-11-09 DIAGNOSIS — Z85118 Personal history of other malignant neoplasm of bronchus and lung: Secondary | ICD-10-CM | POA: Insufficient documentation

## 2019-11-09 DIAGNOSIS — I2510 Atherosclerotic heart disease of native coronary artery without angina pectoris: Secondary | ICD-10-CM | POA: Insufficient documentation

## 2019-11-09 DIAGNOSIS — Z902 Acquired absence of lung [part of]: Secondary | ICD-10-CM | POA: Diagnosis not present

## 2019-11-09 DIAGNOSIS — Z7982 Long term (current) use of aspirin: Secondary | ICD-10-CM | POA: Diagnosis not present

## 2019-11-09 DIAGNOSIS — G4730 Sleep apnea, unspecified: Secondary | ICD-10-CM | POA: Insufficient documentation

## 2019-11-09 DIAGNOSIS — E1136 Type 2 diabetes mellitus with diabetic cataract: Secondary | ICD-10-CM | POA: Insufficient documentation

## 2019-11-09 DIAGNOSIS — Z96652 Presence of left artificial knee joint: Secondary | ICD-10-CM | POA: Insufficient documentation

## 2019-11-09 DIAGNOSIS — I252 Old myocardial infarction: Secondary | ICD-10-CM | POA: Diagnosis not present

## 2019-11-09 DIAGNOSIS — Z7984 Long term (current) use of oral hypoglycemic drugs: Secondary | ICD-10-CM | POA: Insufficient documentation

## 2019-11-09 DIAGNOSIS — Z951 Presence of aortocoronary bypass graft: Secondary | ICD-10-CM | POA: Diagnosis not present

## 2019-11-09 DIAGNOSIS — Z79899 Other long term (current) drug therapy: Secondary | ICD-10-CM | POA: Diagnosis not present

## 2019-11-09 DIAGNOSIS — E7800 Pure hypercholesterolemia, unspecified: Secondary | ICD-10-CM | POA: Insufficient documentation

## 2019-11-09 DIAGNOSIS — Z85038 Personal history of other malignant neoplasm of large intestine: Secondary | ICD-10-CM | POA: Diagnosis not present

## 2019-11-09 DIAGNOSIS — I509 Heart failure, unspecified: Secondary | ICD-10-CM | POA: Diagnosis not present

## 2019-11-09 DIAGNOSIS — H5703 Miosis: Secondary | ICD-10-CM | POA: Diagnosis not present

## 2019-11-09 DIAGNOSIS — E1140 Type 2 diabetes mellitus with diabetic neuropathy, unspecified: Secondary | ICD-10-CM | POA: Insufficient documentation

## 2019-11-09 HISTORY — DX: Cardiac arrhythmia, unspecified: I49.9

## 2019-11-09 HISTORY — PX: CATARACT EXTRACTION W/PHACO: SHX586

## 2019-11-09 HISTORY — DX: Other complications of anesthesia, initial encounter: T88.59XA

## 2019-11-09 LAB — GLUCOSE, CAPILLARY
Glucose-Capillary: 124 mg/dL — ABNORMAL HIGH (ref 70–99)
Glucose-Capillary: 128 mg/dL — ABNORMAL HIGH (ref 70–99)

## 2019-11-09 SURGERY — PHACOEMULSIFICATION, CATARACT, WITH IOL INSERTION
Anesthesia: Monitor Anesthesia Care | Site: Eye | Laterality: Left

## 2019-11-09 MED ORDER — ACETAMINOPHEN 160 MG/5ML PO SOLN: 325.0000 mg | ORAL | Status: DC | PRN

## 2019-11-09 MED ORDER — LIDOCAINE HCL (PF) 2 % IJ SOLN
INTRAOCULAR | Status: DC | PRN
  Administered 2019-11-09: 2 mL

## 2019-11-09 MED ORDER — MIDAZOLAM HCL 2 MG/2ML IJ SOLN
INTRAMUSCULAR | Status: DC | PRN
  Administered 2019-11-09: 1 mg via INTRAVENOUS

## 2019-11-09 MED ORDER — NA HYALUR & NA CHOND-NA HYALUR 0.4-0.35 ML IO KIT
PACK | INTRAOCULAR | Status: DC | PRN
  Administered 2019-11-09: 1 mL via INTRAOCULAR

## 2019-11-09 MED ORDER — LACTATED RINGERS IV SOLN: INTRAVENOUS | Status: DC

## 2019-11-09 MED ORDER — BRIMONIDINE TARTRATE-TIMOLOL 0.2-0.5 % OP SOLN
OPHTHALMIC | Status: DC | PRN
  Administered 2019-11-09: 1 [drp] via OPHTHALMIC

## 2019-11-09 MED ORDER — EPINEPHRINE PF 1 MG/ML IJ SOLN
INTRAOCULAR | Status: DC | PRN
  Administered 2019-11-09: 69 mL via OPHTHALMIC

## 2019-11-09 MED ORDER — CEFUROXIME OPHTHALMIC INJECTION 1 MG/0.1 ML
INJECTION | OPHTHALMIC | Status: DC | PRN
  Administered 2019-11-09: 0.1 mL via INTRACAMERAL

## 2019-11-09 MED ORDER — ARMC OPHTHALMIC DILATING DROPS
1.0000 "application " | OPHTHALMIC | Status: DC | PRN
  Administered 2019-11-09 (×3): 1 via OPHTHALMIC

## 2019-11-09 MED ORDER — MOXIFLOXACIN HCL 0.5 % OP SOLN
1.0000 [drp] | OPHTHALMIC | Status: DC | PRN
  Administered 2019-11-09 (×3): 1 [drp] via OPHTHALMIC

## 2019-11-09 MED ORDER — TETRACAINE HCL 0.5 % OP SOLN
1.0000 [drp] | OPHTHALMIC | Status: DC | PRN
  Administered 2019-11-09 (×3): 1 [drp] via OPHTHALMIC

## 2019-11-09 MED ORDER — ACETAMINOPHEN 325 MG PO TABS: 325.0000 mg | ORAL_TABLET | ORAL | Status: DC | PRN

## 2019-11-09 MED ORDER — FENTANYL CITRATE (PF) 100 MCG/2ML IJ SOLN
INTRAMUSCULAR | Status: DC | PRN
  Administered 2019-11-09: 50 ug via INTRAVENOUS

## 2019-11-09 SURGICAL SUPPLY — 23 items
CANNULA ANT/CHMB 27G (MISCELLANEOUS) ×1 IMPLANT
CANNULA ANT/CHMB 27GA (MISCELLANEOUS) ×3 IMPLANT
GLOVE SURG LX 7.5 STRW (GLOVE) ×2
GLOVE SURG LX STRL 7.5 STRW (GLOVE) ×1 IMPLANT
GLOVE SURG TRIUMPH 8.0 PF LTX (GLOVE) ×3 IMPLANT
GOWN STRL REUS W/ TWL LRG LVL3 (GOWN DISPOSABLE) ×2 IMPLANT
GOWN STRL REUS W/TWL LRG LVL3 (GOWN DISPOSABLE) ×6
LENS IOL TECNIS EYHANCE 23.0 ×2 IMPLANT
MARKER SKIN DUAL TIP RULER LAB (MISCELLANEOUS) ×3 IMPLANT
NDL CAPSULORHEX 25GA (NEEDLE) ×1 IMPLANT
NDL FILTER BLUNT 18X1 1/2 (NEEDLE) ×2 IMPLANT
NEEDLE CAPSULORHEX 25GA (NEEDLE) ×3 IMPLANT
NEEDLE FILTER BLUNT 18X 1/2SAF (NEEDLE) ×4
NEEDLE FILTER BLUNT 18X1 1/2 (NEEDLE) ×2 IMPLANT
PACK CATARACT BRASINGTON (MISCELLANEOUS) ×3 IMPLANT
PACK EYE AFTER SURG (MISCELLANEOUS) ×3 IMPLANT
PACK OPTHALMIC (MISCELLANEOUS) ×3 IMPLANT
RING MALYGIN 7.0 (MISCELLANEOUS) ×2 IMPLANT
SOLUTION OPHTHALMIC SALT (MISCELLANEOUS) ×3 IMPLANT
SYR 3ML LL SCALE MARK (SYRINGE) ×6 IMPLANT
SYR TB 1ML LUER SLIP (SYRINGE) ×3 IMPLANT
WATER STERILE IRR 250ML POUR (IV SOLUTION) ×3 IMPLANT
WIPE NON LINTING 3.25X3.25 (MISCELLANEOUS) ×3 IMPLANT

## 2019-11-09 NOTE — Anesthesia Postprocedure Evaluation (Signed)
Anesthesia Post Note  Patient: Carlisle Enke Posada  Procedure(s) Performed: CATARACT EXTRACTION PHACO AND INTRAOCULAR LENS PLACEMENT (IOC) LEFT DIABETIC MALYUGIN 5.91 01:01.0 9.6% (Left Eye)     Patient location during evaluation: PACU Anesthesia Type: MAC Level of consciousness: awake and alert Pain management: pain level controlled Vital Signs Assessment: post-procedure vital signs reviewed and stable Respiratory status: spontaneous breathing, nonlabored ventilation, respiratory function stable and patient connected to nasal cannula oxygen Cardiovascular status: stable and blood pressure returned to baseline Postop Assessment: no apparent nausea or vomiting Anesthetic complications: no   No complications documented.  Trecia Rogers

## 2019-11-09 NOTE — Transfer of Care (Signed)
Immediate Anesthesia Transfer of Care Note  Patient: Erik Collins  Procedure(s) Performed: CATARACT EXTRACTION PHACO AND INTRAOCULAR LENS PLACEMENT (IOC) LEFT DIABETIC MALYUGIN 5.91 01:01.0 9.6% (Left Eye)  Patient Location: PACU  Anesthesia Type: MAC  Level of Consciousness: awake, alert  and patient cooperative  Airway and Oxygen Therapy: Patient Spontanous Breathing and Patient connected to supplemental oxygen  Post-op Assessment: Post-op Vital signs reviewed, Patient's Cardiovascular Status Stable, Respiratory Function Stable, Patent Airway and No signs of Nausea or vomiting  Post-op Vital Signs: Reviewed and stable  Complications: No complications documented.

## 2019-11-09 NOTE — Anesthesia Procedure Notes (Signed)
Procedure Name: MAC Date/Time: 11/09/2019 7:43 AM Performed by: Silvana Newness, CRNA Pre-anesthesia Checklist: Patient identified, Emergency Drugs available, Suction available, Patient being monitored and Timeout performed Patient Re-evaluated:Patient Re-evaluated prior to induction Oxygen Delivery Method: Nasal cannula Placement Confirmation: positive ETCO2

## 2019-11-09 NOTE — Anesthesia Preprocedure Evaluation (Signed)
Anesthesia Evaluation  Patient identified by MRN, date of birth, ID band Patient awake    Reviewed: Allergy & Precautions, H&P , NPO status , Patient's Chart, lab work & pertinent test results, reviewed documented beta blocker date and time   Airway Mallampati: II  TM Distance: >3 FB Neck ROM: full    Dental no notable dental hx.    Pulmonary sleep apnea , COPD, former smoker,    Pulmonary exam normal breath sounds clear to auscultation       Cardiovascular Exercise Tolerance: Good hypertension, + CAD, + Past MI, + CABG and +CHF  Normal cardiovascular exam+ dysrhythmias  Rhythm:regular Rate:Normal     Neuro/Psych negative neurological ROS  negative psych ROS   GI/Hepatic Neg liver ROS, GERD  ,  Endo/Other  diabetes  Renal/GU negative Renal ROS  negative genitourinary   Musculoskeletal   Abdominal   Peds  Hematology negative hematology ROS (+)   Anesthesia Other Findings   Reproductive/Obstetrics negative OB ROS                             Anesthesia Physical Anesthesia Plan  ASA: III  Anesthesia Plan: MAC   Post-op Pain Management:    Induction:   PONV Risk Score and Plan:   Airway Management Planned:   Additional Equipment:   Intra-op Plan:   Post-operative Plan:   Informed Consent: I have reviewed the patients History and Physical, chart, labs and discussed the procedure including the risks, benefits and alternatives for the proposed anesthesia with the patient or authorized representative who has indicated his/her understanding and acceptance.     Dental Advisory Given  Plan Discussed with: CRNA and Anesthesiologist  Anesthesia Plan Comments:         Anesthesia Quick Evaluation

## 2019-11-09 NOTE — Op Note (Signed)
OPERATIVE NOTE  Erik Collins [id number] 11/09/2019  PREOPERATIVE DIAGNOSIS:   Nuclear sclerotic cataract left eye with miotic pupil      H25.12   POSTOPERATIVE DIAGNOSIS:   Nuclear sclerotic cataract left eye with miotic pupil.     PROCEDURE:  Phacoemulsification with posterior chamber intraocular lens implantation of the left eye which required pupil stretching with the Malyugin pupil expansion device  Ultrasound time: Procedure(s): CATARACT EXTRACTION PHACO AND INTRAOCULAR LENS PLACEMENT (IOC) LEFT DIABETIC MALYUGIN 5.91 01:01.0 9.6% (Left)  LENS:   Implant Name Type Inv. Item Serial No. Manufacturer Lot No. LRB No. Used Action  LENS II EYHANCE 23.0 - [id number]  LENS II EYHANCE 23.0 [id number] [name]   Left 1 Implanted          SURGEON:  Wyonia Hough, MD   ANESTHESIA: Topical with tetracaine drops and 2% Xylocaine jelly, augmented with 1% preservative-free intracameral lidocaine.   COMPLICATIONS:  None.   DESCRIPTION OF PROCEDURE:  The patient was identified in the holding room and transported to the operating room and placed in the supine position under the operating microscope.  The left eye was identified as the operative eye and it was prepped and draped in the usual sterile ophthalmic fashion.   A 1 millimeter clear-corneal paracentesis was made at the 1:30 position.  The anterior chamber was filled with Viscoat viscoelastic.  0.5 ml of preservative-free 1% lidocaine was injected into the anterior chamber.  A 2.4 millimeter keratome was used to make a near-clear corneal incision at the 10:30 position.  A Malyugin pupil expander was then placed through the main incision and into the anterior chamber of the eye.  The edge of the iris was secured on the lip of the pupil expander and it was released, thereby expanding the pupil to approximately 7 millimeters for completion of the cataract surgery.  Additional Viscoat was placed in the anterior chamber.  A cystotome and  capsulorrhexis forceps were used to make a curvilinear capsulorrhexis.   Balanced salt solution was used to hydrodissect and hydrodelineate the lens nucleus.   Phacoemulsification was used in stop and chop fashion to remove the lens, nucleus and epinucleus.  The remaining cortex was aspirated using the irrigation aspiration handpiece.  Additional Provisc was placed into the eye to distend the capsular bag for lens placement.  A lens was then injected into the capsular bag.  The pupil expanding ring was removed using a Kuglen hook and insertion device. The remaining viscoelastic was aspirated from the capsular bag and the anterior chamber.  The anterior chamber was filled with balanced salt solution to inflate to a physiologic pressure.   Wounds were hydrated with balanced salt solution.  The anterior chamber was inflated to a physiologic pressure with balanced salt solution.  No wound leaks were noted. Cefuroxime 0.1 ml of a 10mg /ml solution was injected into the anterior chamber for a dose of 1 mg of intracameral antibiotic at the completion of the case.   Timolol and Brimonidine drops were applied to the eye.  The patient was taken to the recovery room in stable condition without complications of anesthesia or surgery.  [name] 11/09/2019, 8:02 AM

## 2019-11-09 NOTE — H&P (Signed)
°  The History and Physical notes are on paper, have been signed, and are to be scanned. The patient remains stable and unchanged from the H&P.   Previous H&P reviewed, patient examined, and there are no changes.  The patient has a visually significant cataract interfering with his or her vision.  I attest that the following are true and accurate to the best of my knowledge: 1. The patient's impairment of visual function is believed not to be correctable with a tolerable change in glasses or contact lenses. 2. Cataract (in the operative eye) is believed to be significantly contributing to the patient's visual impairment. 3. The patient desires surgical correction; the risks, benefits, and alternatives have been explained, and questions have been answered to the patients satisfaction.  A reasonable expectation exists that cataract surgery will significantly improve both the visual and functional status of the patient.  Charlcie Prisco 11/09/2019 7:33 AM

## 2019-11-21 ENCOUNTER — Other Ambulatory Visit
Admission: RE | Admit: 2019-11-21 | Discharge: 2019-11-21 | Disposition: A | Discharge status: Home / Self Care | Payer: Medicare Other | Source: Ambulatory Visit | Attending: Internal Medicine | Admitting: Internal Medicine

## 2019-11-21 ENCOUNTER — Other Ambulatory Visit: Payer: Self-pay

## 2019-11-21 DIAGNOSIS — Z01812 Encounter for preprocedural laboratory examination: Secondary | ICD-10-CM | POA: Diagnosis present

## 2019-11-21 DIAGNOSIS — Z20822 Contact with and (suspected) exposure to covid-19: Secondary | ICD-10-CM | POA: Insufficient documentation

## 2019-11-21 LAB — SARS CORONAVIRUS 2 (TAT 6-24 HRS): SARS Coronavirus 2: NEGATIVE

## 2019-11-22 ENCOUNTER — Encounter: Payer: Self-pay | Admitting: Internal Medicine

## 2019-11-23 ENCOUNTER — Other Ambulatory Visit: Payer: Self-pay

## 2019-11-23 ENCOUNTER — Ambulatory Visit
Admission: RE | Admit: 2019-11-23 | Discharge: 2019-11-23 | Disposition: A | Discharge status: Home / Self Care | Payer: Medicare Other | Attending: Internal Medicine | Admitting: Internal Medicine

## 2019-11-23 ENCOUNTER — Ambulatory Visit: Payer: Medicare Other | Admitting: Certified Registered"

## 2019-11-23 ENCOUNTER — Encounter
Admission: RE | Disposition: A | Discharge status: Home / Self Care | Payer: Self-pay | Source: Home / Self Care | Attending: Internal Medicine

## 2019-11-23 ENCOUNTER — Encounter: Payer: Self-pay | Admitting: Internal Medicine

## 2019-11-23 DIAGNOSIS — E119 Type 2 diabetes mellitus without complications: Secondary | ICD-10-CM | POA: Diagnosis not present

## 2019-11-23 DIAGNOSIS — R195 Other fecal abnormalities: Secondary | ICD-10-CM | POA: Insufficient documentation

## 2019-11-23 DIAGNOSIS — I2510 Atherosclerotic heart disease of native coronary artery without angina pectoris: Secondary | ICD-10-CM | POA: Diagnosis not present

## 2019-11-23 DIAGNOSIS — J449 Chronic obstructive pulmonary disease, unspecified: Secondary | ICD-10-CM | POA: Diagnosis not present

## 2019-11-23 DIAGNOSIS — Z79899 Other long term (current) drug therapy: Secondary | ICD-10-CM | POA: Insufficient documentation

## 2019-11-23 DIAGNOSIS — M1990 Unspecified osteoarthritis, unspecified site: Secondary | ICD-10-CM | POA: Insufficient documentation

## 2019-11-23 DIAGNOSIS — G4730 Sleep apnea, unspecified: Secondary | ICD-10-CM | POA: Insufficient documentation

## 2019-11-23 DIAGNOSIS — Z791 Long term (current) use of non-steroidal anti-inflammatories (NSAID): Secondary | ICD-10-CM | POA: Insufficient documentation

## 2019-11-23 DIAGNOSIS — I252 Old myocardial infarction: Secondary | ICD-10-CM | POA: Diagnosis not present

## 2019-11-23 DIAGNOSIS — K5730 Diverticulosis of large intestine without perforation or abscess without bleeding: Secondary | ICD-10-CM | POA: Insufficient documentation

## 2019-11-23 DIAGNOSIS — Z7982 Long term (current) use of aspirin: Secondary | ICD-10-CM | POA: Diagnosis not present

## 2019-11-23 DIAGNOSIS — Z85118 Personal history of other malignant neoplasm of bronchus and lung: Secondary | ICD-10-CM | POA: Diagnosis not present

## 2019-11-23 DIAGNOSIS — Z8719 Personal history of other diseases of the digestive system: Secondary | ICD-10-CM | POA: Diagnosis not present

## 2019-11-23 DIAGNOSIS — I110 Hypertensive heart disease with heart failure: Secondary | ICD-10-CM | POA: Insufficient documentation

## 2019-11-23 DIAGNOSIS — I509 Heart failure, unspecified: Secondary | ICD-10-CM | POA: Diagnosis not present

## 2019-11-23 DIAGNOSIS — K640 First degree hemorrhoids: Secondary | ICD-10-CM | POA: Insufficient documentation

## 2019-11-23 DIAGNOSIS — K219 Gastro-esophageal reflux disease without esophagitis: Secondary | ICD-10-CM | POA: Insufficient documentation

## 2019-11-23 DIAGNOSIS — Z951 Presence of aortocoronary bypass graft: Secondary | ICD-10-CM | POA: Insufficient documentation

## 2019-11-23 DIAGNOSIS — Z87891 Personal history of nicotine dependence: Secondary | ICD-10-CM | POA: Insufficient documentation

## 2019-11-23 DIAGNOSIS — Z7984 Long term (current) use of oral hypoglycemic drugs: Secondary | ICD-10-CM | POA: Insufficient documentation

## 2019-11-23 DIAGNOSIS — E7800 Pure hypercholesterolemia, unspecified: Secondary | ICD-10-CM | POA: Insufficient documentation

## 2019-11-23 DIAGNOSIS — Z794 Long term (current) use of insulin: Secondary | ICD-10-CM | POA: Insufficient documentation

## 2019-11-23 HISTORY — DX: Diverticulosis of intestine, part unspecified, without perforation or abscess without bleeding: K57.90

## 2019-11-23 HISTORY — DX: Duodenitis without bleeding: K29.80

## 2019-11-23 HISTORY — PX: COLONOSCOPY: SHX5424

## 2019-11-23 HISTORY — DX: Other hemorrhoids: K64.8

## 2019-11-23 HISTORY — DX: Polyp of colon: K63.5

## 2019-11-23 LAB — GLUCOSE, CAPILLARY: Glucose-Capillary: 75 mg/dL (ref 70–99)

## 2019-11-23 SURGERY — COLONOSCOPY
Anesthesia: General

## 2019-11-23 MED ORDER — LIDOCAINE HCL (CARDIAC) PF 100 MG/5ML IV SOSY
PREFILLED_SYRINGE | INTRAVENOUS | Status: DC | PRN
  Administered 2019-11-23: 100 mg via INTRAVENOUS

## 2019-11-23 MED ORDER — SODIUM CHLORIDE 0.9 % IV SOLN: INTRAVENOUS | Status: DC

## 2019-11-23 MED ORDER — PROPOFOL 10 MG/ML IV BOLUS
INTRAVENOUS | Status: DC | PRN
  Administered 2019-11-23: 50 mg via INTRAVENOUS

## 2019-11-23 MED ORDER — PROPOFOL 500 MG/50ML IV EMUL
INTRAVENOUS | Status: DC | PRN
  Administered 2019-11-23: 165 ug/kg/min via INTRAVENOUS

## 2019-11-23 NOTE — Op Note (Signed)
[facility] Gastroenterology Patient Name: Erik Collins Procedure Date: 11/23/2019 10:27 AM MRN: [id number] Account #: [id number] Date of Birth: 08/30/1945 Admit Type: Outpatient Age: 74 Room: [facility] ENDO ROOM 4 Gender: Male Note Status: Finalized Procedure:             Colonoscopy Indications:           Heme positive stool Providers:             [name]. Alice Reichert MD, MD Referring MD:          Perrin Maltese, MD (Referring MD) Medicines:             Propofol per Anesthesia Complications:         No immediate complications. Procedure:             Pre-Anesthesia Assessment:                        - The risks and benefits of the procedure and the                         sedation options and risks were discussed with the                         patient. All questions were answered and informed                         consent was obtained.                        - Patient identification and proposed procedure were                         verified prior to the procedure by the nurse. The                         procedure was verified in the procedure room.                        - ASA Grade Assessment: III - A patient with severe                         systemic disease.                        - After reviewing the risks and benefits, the patient                         was deemed in satisfactory condition to undergo the                         procedure.                        After obtaining informed consent, the colonoscope was                         passed under direct vision. Throughout the procedure,                         the patient's blood pressure, pulse, and  oxygen                         saturations were monitored continuously. The                         Colonoscope was introduced through the anus and                         advanced to the the cecum, identified by appendiceal                         orifice and ileocecal valve. The colonoscopy was                          performed without difficulty. The patient tolerated                         the procedure well. The quality of the bowel                         preparation was good. The ileocecal valve, appendiceal                         orifice, and rectum were photographed. Findings:      The perianal and digital rectal examinations were normal. Pertinent       negatives include normal sphincter tone and no palpable rectal lesions.      A few small-mouthed diverticula were found in the sigmoid colon.      Non-bleeding internal hemorrhoids were found during retroflexion. The       hemorrhoids were Grade I (internal hemorrhoids that do not prolapse).      The exam was otherwise without abnormality. Impression:            - Diverticulosis in the sigmoid colon.                        - Non-bleeding internal hemorrhoids.                        - The examination was otherwise normal.                        - No specimens collected. Recommendation:        - Patient has a contact number available for                         emergencies. The signs and symptoms of potential                         delayed complications were discussed with the patient.                         Return to normal activities tomorrow. Written                         discharge instructions were provided to the patient.                        - Resume previous diet.                        -  Continue present medications.                        - No repeat colonoscopy due to current age (66 years                         or older) and the absence of colonic polyps.                        - Return to GI office PRN.                        - You do NOT require further colon cancer screening or                         surveillance measures (Annual stool testing (i.e.                         hemoccult, FIT, cologuard), sigmoidoscopy, colonoscopy                         or CT colonography). You should share this                          recommendation with your Primary Care provider. Procedure Code(s):     --- Professional ---                        [phone], Colonoscopy, flexible; diagnostic, including                         collection of specimen(s) by brushing or washing, when                         performed (separate procedure) Diagnosis Code(s):     --- Professional ---                        K57.30, Diverticulosis of large intestine without                         perforation or abscess without bleeding                        R19.5, Other fecal abnormalities                        K64.0, First degree hemorrhoids CPT copyright 2019 American Medical Association. All rights reserved. The codes documented in this report are preliminary and upon coder review may  be revised to meet current compliance requirements. Efrain Sella MD, MD 11/23/2019 11:05:32 AM This report has been signed electronically. Number of Addenda: 0 Note Initiated On: 11/23/2019 10:27 AM Scope Withdrawal Time: 0 hours 11 minutes 16 seconds  Total Procedure Duration: 0 hours 14 minutes 46 seconds  Estimated Blood Loss:  Estimated blood loss: none.      [facility]

## 2019-11-23 NOTE — Transfer of Care (Signed)
Immediate Anesthesia Transfer of Care Note  Patient: Erik Collins  Procedure(s) Performed: COLONOSCOPY (N/A )  Patient Location: Endoscopy Unit  Anesthesia Type:General  Level of Consciousness: drowsy, patient cooperative and responds to stimulation  Airway & Oxygen Therapy: Patient Spontanous Breathing and Patient connected to face mask oxygen  Post-op Assessment: Report given to RN and Post -op Vital signs reviewed and stable  Post vital signs: Reviewed and stable  Last Vitals:  Vitals Value Taken Time  BP 91/50 11/23/19 1105  Temp    Pulse 53 11/23/19 1108  Resp 14 11/23/19 1108  SpO2 99 % 11/23/19 1108  Vitals shown include unvalidated device data.  Last Pain:  Vitals:   11/23/19 1105  TempSrc:   PainSc: Asleep         Complications: No complications documented.

## 2019-11-23 NOTE — Interval H&P Note (Signed)
History and Physical Interval Note:  11/23/2019 10:35 AM  Erik Collins  has presented today for surgery, with the diagnosis of PERSONAL HX.OF COLON POLYPS.  The various methods of treatment have been discussed with the patient and family. After consideration of risks, benefits and other options for treatment, the patient has consented to  Procedure(s): COLONOSCOPY (N/A) as a surgical intervention.  The patient's history has been reviewed, patient examined, no change in status, stable for surgery.  I have reviewed the patient's chart and labs.  Questions were answered to the patient's satisfaction.     [place], [place]

## 2019-11-23 NOTE — Anesthesia Postprocedure Evaluation (Signed)
Anesthesia Post Note  Patient: Erik Collins  Procedure(s) Performed: COLONOSCOPY (N/A )  Patient location during evaluation: Endoscopy Anesthesia Type: General Level of consciousness: awake and alert and oriented Pain management: pain level controlled Vital Signs Assessment: post-procedure vital signs reviewed and stable Respiratory status: spontaneous breathing, nonlabored ventilation and respiratory function stable Cardiovascular status: blood pressure returned to baseline and stable Postop Assessment: no signs of nausea or vomiting Anesthetic complications: no   No complications documented.   Last Vitals:  Vitals:   11/23/19 1105 11/23/19 1125  BP: (!) 91/50 129/71  Pulse: (!) 54 (!) 55  Resp: 14 18  Temp:    SpO2: 99% 96%    Last Pain:  Vitals:   11/23/19 1105  TempSrc:   PainSc: Asleep                 Bettye Sitton

## 2019-11-23 NOTE — Anesthesia Preprocedure Evaluation (Signed)
Anesthesia Evaluation  Patient identified by MRN, date of birth, ID band Patient awake    Reviewed: Allergy & Precautions, NPO status , Patient's Chart, lab work & pertinent test results  History of Anesthesia Complications Negative for: history of anesthetic complications  Airway Mallampati: III  TM Distance: >3 FB Neck ROM: Full    Dental  (+) Upper Dentures   Pulmonary sleep apnea , COPD, former smoker,    breath sounds clear to auscultation- rhonchi (-) wheezing      Cardiovascular hypertension, Pt. on medications + CAD, + Past MI, + CABG and +CHF   Rhythm:Regular Rate:Normal - Systolic murmurs and - Diastolic murmurs    Neuro/Psych neg Seizures negative neurological ROS  negative psych ROS   GI/Hepatic Neg liver ROS, GERD  ,  Endo/Other  diabetes, Insulin Dependent  Renal/GU negative Renal ROS     Musculoskeletal  (+) Arthritis ,   Abdominal (+) - obese,   Peds  Hematology negative hematology ROS (+)   Anesthesia Other Findings Past Medical History: No date: Arthritis     Comment:  osteoarthritis No date: Cancer (HCC)     Comment:  hx lung cancer No date: CHF (congestive heart failure) (HCC) No date: Complication of anesthesia     Comment:  knee surgery caused arrythmia requiring a temporary               pacemaker No date: COPD (chronic obstructive pulmonary disease) (HCC) No date: Coronary artery disease     Comment:  s/p CABG No date: Diabetes mellitus without complication (HCC) No date: Diverticulosis No date: Duodenitis No date: Dysrhythmia     Comment:  during total knee replacement patient required a               temporary pacemaker No date: GERD (gastroesophageal reflux disease) No date: Hypercholesterolemia No date: Hyperplastic colon polyp No date: Hypertension No date: Internal hemorrhoids No date: Lung cancer ([place]) No date: Lung cancer ([place]) No date: Myocardial infarction [facility])      Comment:  09/2009 f/up with Dr. Neoma Laming No date: Sleep apnea   Reproductive/Obstetrics                             Anesthesia Physical Anesthesia Plan  ASA: III  Anesthesia Plan: General   Post-op Pain Management:    Induction: Intravenous  PONV Risk Score and Plan: 1 and Propofol infusion  Airway Management Planned: Natural Airway  Additional Equipment:   Intra-op Plan:   Post-operative Plan:   Informed Consent: I have reviewed the patients History and Physical, chart, labs and discussed the procedure including the risks, benefits and alternatives for the proposed anesthesia with the patient or authorized representative who has indicated his/her understanding and acceptance.     Dental advisory given  Plan Discussed with: CRNA and Anesthesiologist  Anesthesia Plan Comments:         Anesthesia Quick Evaluation

## 2019-11-23 NOTE — H&P (Signed)
Outpatient short stay form Pre-procedure 11/23/2019 10:34 AM Erik Collins Erik Collins, M.D.  Primary Physician: Erik Collins, M.D.  Reason for visit:  Heme positive stool  History of present illness:  Patient presents for colonoscopy for hemoccult positive stool. Patient denies change in bowel habits, rectal bleeding, weight loss or abdominal pain.      Current Facility-Administered Medications:  .  0.9 %  sodium chloride infusion, , Intravenous, Continuous, [place], [name], MD, Last Rate: 20 mL/hr at 11/23/19 1033, Continued from Pre-op at 11/23/19 1033  Medications Prior to Admission  Medication Sig Dispense Refill Last Dose  . aspirin EC 81 MG tablet Take 81 mg by mouth daily.   Past Week at Unknown time  . carvedilol (COREG) 12.5 MG tablet    11/23/2019 at 0600  . ezetimibe (ZETIA) 10 MG tablet Take 10 mg by mouth daily.   11/23/2019 at 0600  . losartan (COZAAR) 50 MG tablet Take 50 mg by mouth daily.   11/23/2019 at 0600  . acetaminophen (TYLENOL 8 HOUR ARTHRITIS PAIN) 650 MG CR tablet Take 1,300 mg by mouth 2 (two) times daily.     . Bempedoic Acid (NEXLETOL PO) Take by mouth.     . clonazePAM (KLONOPIN) 1 MG tablet Take 1 mg by mouth at bedtime. For sleep     . empagliflozin (JARDIANCE) 10 MG TABS tablet Take 10 mg by mouth daily.     . famotidine (PEPCID) 20 MG tablet Take 1 tablet (20 mg total) by mouth 2 (two) times daily. 60 tablet 1   . gabapentin (NEURONTIN) 300 MG capsule      . Insulin Glargine (TOUJEO MAX SOLOSTAR) 300 UNIT/ML SOPN Inject 0-40 Units into the skin See admin instructions. Sliding scale insulin per patient  3 units if blood sugar is greater than 150 during the day, if blood sugars are less= no insulin 40 units if blood sugar is greater than 150 at night, if blood sugars are less=no insulin     . lisinopril (PRINIVIL,ZESTRIL) 5 MG tablet Take 1 tablet (5 mg total) by mouth daily. 30 tablet 0   . meclizine (ANTIVERT) 25 MG tablet Take 1 tablet (25 mg total) by mouth  3 (three) times daily as needed for dizziness. 15 tablet 0   . meloxicam (MOBIC) 15 MG tablet      . metFORMIN (GLUCOPHAGE) 1000 MG tablet Take 1,000 mg by mouth 2 (two) times daily.     . Omega-3 Fatty Acids (FISH OIL) 1200 MG CAPS Take 1,200 mg by mouth 2 (two) times daily.     [name] omeprazole (PRILOSEC) 20 MG capsule Take 20 mg by mouth daily.     [name] oxyCODONE (OXY IR/ROXICODONE) 5 MG immediate release tablet Take 1-2 tablets (5-10 mg total) by mouth every 6 (six) hours as needed for moderate pain or severe pain. (Patient not taking: Reported on 10/07/2018) 30 tablet 0   . spironolactone (ALDACTONE) 25 MG tablet Take 25 mg by mouth daily.     [name] tiZANidine (ZANAFLEX) 2 MG tablet Take 4 mg by mouth at bedtime.     . vitamin B-12 (CYANOCOBALAMIN) 500 MCG tablet Take 500 mcg by mouth daily.        No Known Allergies   Past Medical History:  Diagnosis Date  . Arthritis    osteoarthritis  . Cancer ([place])    hx lung cancer  . CHF (congestive heart failure) ([place])   . Complication of anesthesia    knee surgery caused arrythmia requiring  a temporary pacemaker  . COPD (chronic obstructive pulmonary disease) ([place])   . Coronary artery disease    s/p CABG  . Diabetes mellitus without complication ([place])   . Diverticulosis   . Duodenitis   . Dysrhythmia    during total knee replacement patient required a temporary pacemaker  . GERD (gastroesophageal reflux disease)   . Hypercholesterolemia   . Hyperplastic colon polyp   . Hypertension   . Internal hemorrhoids   . Lung cancer ([place])   . Lung cancer ([place])   . Myocardial infarction [facility])    09/2009 f/up with Dr. Neoma Laming  . Sleep apnea     Review of systems:  Otherwise negative.    Physical Exam  Gen: Alert, oriented. Appears stated age.  HEENT: /AT. PERRLA. Lungs: CTA, no wheezes. CV: RR nl S1, S2. Abd: soft, benign, no masses. BS+ Ext: No edema. Pulses 2+    Planned procedures: Proceed with colonoscopy. The patient  understands the nature of the planned procedure, indications, risks, alternatives and potential complications including but not limited to bleeding, infection, perforation, damage to internal organs and possible oversedation/side effects from anesthesia. The patient agrees and gives consent to proceed.  Please refer to procedure notes for findings, recommendations and patient disposition/instructions.     Koral Thaden Erik Collins, M.D. Gastroenterology 11/23/2019  10:34 AM

## 2019-11-24 ENCOUNTER — Encounter: Payer: Self-pay | Admitting: Internal Medicine

## 2019-11-29 ENCOUNTER — Encounter: Payer: Self-pay | Admitting: Ophthalmology

## 2019-12-05 ENCOUNTER — Other Ambulatory Visit: Payer: Self-pay

## 2019-12-05 ENCOUNTER — Other Ambulatory Visit
Admission: RE | Admit: 2019-12-05 | Discharge: 2019-12-05 | Disposition: A | Discharge status: Home / Self Care | Payer: Medicare Other | Source: Ambulatory Visit | Attending: Ophthalmology | Admitting: Ophthalmology

## 2019-12-05 DIAGNOSIS — Z20822 Contact with and (suspected) exposure to covid-19: Secondary | ICD-10-CM | POA: Diagnosis not present

## 2019-12-05 DIAGNOSIS — Z01812 Encounter for preprocedural laboratory examination: Secondary | ICD-10-CM | POA: Diagnosis present

## 2019-12-05 LAB — SARS CORONAVIRUS 2 (TAT 6-24 HRS): SARS Coronavirus 2: NEGATIVE

## 2019-12-05 NOTE — Discharge Instructions (Signed)

## 2019-12-07 ENCOUNTER — Encounter: Payer: Self-pay | Admitting: Ophthalmology

## 2019-12-07 ENCOUNTER — Ambulatory Visit: Payer: Medicare Other | Admitting: Anesthesiology

## 2019-12-07 ENCOUNTER — Encounter
Admission: RE | Disposition: A | Discharge status: Home / Self Care | Payer: Self-pay | Source: Home / Self Care | Attending: Ophthalmology

## 2019-12-07 ENCOUNTER — Ambulatory Visit
Admission: RE | Admit: 2019-12-07 | Discharge: 2019-12-07 | Disposition: A | Discharge status: Home / Self Care | Payer: Medicare Other | Attending: Ophthalmology | Admitting: Ophthalmology

## 2019-12-07 ENCOUNTER — Other Ambulatory Visit: Payer: Self-pay

## 2019-12-07 DIAGNOSIS — K219 Gastro-esophageal reflux disease without esophagitis: Secondary | ICD-10-CM | POA: Diagnosis not present

## 2019-12-07 DIAGNOSIS — H2511 Age-related nuclear cataract, right eye: Secondary | ICD-10-CM | POA: Insufficient documentation

## 2019-12-07 DIAGNOSIS — Z7982 Long term (current) use of aspirin: Secondary | ICD-10-CM | POA: Diagnosis not present

## 2019-12-07 DIAGNOSIS — J449 Chronic obstructive pulmonary disease, unspecified: Secondary | ICD-10-CM | POA: Insufficient documentation

## 2019-12-07 DIAGNOSIS — E7800 Pure hypercholesterolemia, unspecified: Secondary | ICD-10-CM | POA: Diagnosis not present

## 2019-12-07 DIAGNOSIS — G4730 Sleep apnea, unspecified: Secondary | ICD-10-CM | POA: Diagnosis not present

## 2019-12-07 DIAGNOSIS — Z951 Presence of aortocoronary bypass graft: Secondary | ICD-10-CM | POA: Diagnosis not present

## 2019-12-07 DIAGNOSIS — Z85118 Personal history of other malignant neoplasm of bronchus and lung: Secondary | ICD-10-CM | POA: Diagnosis not present

## 2019-12-07 DIAGNOSIS — I252 Old myocardial infarction: Secondary | ICD-10-CM | POA: Diagnosis not present

## 2019-12-07 DIAGNOSIS — Z794 Long term (current) use of insulin: Secondary | ICD-10-CM | POA: Insufficient documentation

## 2019-12-07 DIAGNOSIS — Z79899 Other long term (current) drug therapy: Secondary | ICD-10-CM | POA: Diagnosis not present

## 2019-12-07 DIAGNOSIS — Z8249 Family history of ischemic heart disease and other diseases of the circulatory system: Secondary | ICD-10-CM | POA: Diagnosis not present

## 2019-12-07 DIAGNOSIS — I2510 Atherosclerotic heart disease of native coronary artery without angina pectoris: Secondary | ICD-10-CM | POA: Insufficient documentation

## 2019-12-07 DIAGNOSIS — E1136 Type 2 diabetes mellitus with diabetic cataract: Secondary | ICD-10-CM | POA: Diagnosis not present

## 2019-12-07 DIAGNOSIS — I110 Hypertensive heart disease with heart failure: Secondary | ICD-10-CM | POA: Insufficient documentation

## 2019-12-07 DIAGNOSIS — M1990 Unspecified osteoarthritis, unspecified site: Secondary | ICD-10-CM | POA: Insufficient documentation

## 2019-12-07 DIAGNOSIS — I509 Heart failure, unspecified: Secondary | ICD-10-CM | POA: Insufficient documentation

## 2019-12-07 DIAGNOSIS — Z833 Family history of diabetes mellitus: Secondary | ICD-10-CM | POA: Insufficient documentation

## 2019-12-07 DIAGNOSIS — Z791 Long term (current) use of non-steroidal anti-inflammatories (NSAID): Secondary | ICD-10-CM | POA: Insufficient documentation

## 2019-12-07 DIAGNOSIS — H5703 Miosis: Secondary | ICD-10-CM | POA: Diagnosis not present

## 2019-12-07 DIAGNOSIS — Z87891 Personal history of nicotine dependence: Secondary | ICD-10-CM | POA: Diagnosis not present

## 2019-12-07 HISTORY — PX: CATARACT EXTRACTION W/PHACO: SHX586

## 2019-12-07 LAB — GLUCOSE, CAPILLARY
Glucose-Capillary: 136 mg/dL — ABNORMAL HIGH (ref 70–99)
Glucose-Capillary: 145 mg/dL — ABNORMAL HIGH (ref 70–99)

## 2019-12-07 SURGERY — PHACOEMULSIFICATION, CATARACT, WITH IOL INSERTION
Anesthesia: Monitor Anesthesia Care | Site: Eye | Laterality: Right

## 2019-12-07 MED ORDER — MIDAZOLAM HCL 2 MG/2ML IJ SOLN
INTRAMUSCULAR | Status: DC | PRN
  Administered 2019-12-07: 1.5 mg via INTRAVENOUS

## 2019-12-07 MED ORDER — CEFUROXIME OPHTHALMIC INJECTION 1 MG/0.1 ML
INJECTION | OPHTHALMIC | Status: DC | PRN
  Administered 2019-12-07: 0.1 mL via INTRACAMERAL

## 2019-12-07 MED ORDER — LACTATED RINGERS IV SOLN: INTRAVENOUS | Status: DC

## 2019-12-07 MED ORDER — LIDOCAINE HCL (PF) 2 % IJ SOLN
INTRAOCULAR | Status: DC | PRN
  Administered 2019-12-07: 1 mL

## 2019-12-07 MED ORDER — EPINEPHRINE PF 1 MG/ML IJ SOLN
INTRAOCULAR | Status: DC | PRN
  Administered 2019-12-07: 59 mL via OPHTHALMIC

## 2019-12-07 MED ORDER — MOXIFLOXACIN HCL 0.5 % OP SOLN
1.0000 [drp] | OPHTHALMIC | Status: DC | PRN
  Administered 2019-12-07 (×3): 1 [drp] via OPHTHALMIC

## 2019-12-07 MED ORDER — NA HYALUR & NA CHOND-NA HYALUR 0.4-0.35 ML IO KIT
PACK | INTRAOCULAR | Status: DC | PRN
  Administered 2019-12-07: 1 mL via INTRAOCULAR

## 2019-12-07 MED ORDER — FENTANYL CITRATE (PF) 100 MCG/2ML IJ SOLN
INTRAMUSCULAR | Status: DC | PRN
  Administered 2019-12-07: 75 ug via INTRAVENOUS

## 2019-12-07 MED ORDER — TETRACAINE HCL 0.5 % OP SOLN
1.0000 [drp] | OPHTHALMIC | Status: DC | PRN
  Administered 2019-12-07 (×3): 1 [drp] via OPHTHALMIC

## 2019-12-07 MED ORDER — ARMC OPHTHALMIC DILATING DROPS
1.0000 "application " | OPHTHALMIC | Status: DC | PRN
  Administered 2019-12-07 (×3): 1 via OPHTHALMIC

## 2019-12-07 MED ORDER — BRIMONIDINE TARTRATE-TIMOLOL 0.2-0.5 % OP SOLN
OPHTHALMIC | Status: DC | PRN
  Administered 2019-12-07: 1 [drp] via OPHTHALMIC

## 2019-12-07 SURGICAL SUPPLY — 20 items
CANNULA ANT/CHMB 27GA (MISCELLANEOUS) ×3 IMPLANT
GLOVE SURG LX 7.5 STRW (GLOVE) ×2
GLOVE SURG LX STRL 7.5 STRW (GLOVE) ×1 IMPLANT
GLOVE SURG TRIUMPH 8.0 PF LTX (GLOVE) ×3 IMPLANT
GOWN STRL REUS W/ TWL LRG LVL3 (GOWN DISPOSABLE) ×2 IMPLANT
GOWN STRL REUS W/TWL LRG LVL3 (GOWN DISPOSABLE) ×6
LENS IOL TECNIS EYHANCE 24.0 (Intraocular Lens) ×3 IMPLANT
MARKER SKIN DUAL TIP RULER LAB (MISCELLANEOUS) ×3 IMPLANT
NEEDLE CAPSULORHEX 25GA (NEEDLE) ×3 IMPLANT
NEEDLE FILTER BLUNT 18X 1/2SAF (NEEDLE) ×4
NEEDLE FILTER BLUNT 18X1 1/2 (NEEDLE) ×2 IMPLANT
PACK CATARACT BRASINGTON (MISCELLANEOUS) ×3 IMPLANT
PACK EYE AFTER SURG (MISCELLANEOUS) ×3 IMPLANT
PACK OPTHALMIC (MISCELLANEOUS) ×3 IMPLANT
RING MALYGIN 7.0 (MISCELLANEOUS) ×3 IMPLANT
SOLUTION OPHTHALMIC SALT (MISCELLANEOUS) ×3 IMPLANT
SYR 3ML LL SCALE MARK (SYRINGE) ×6 IMPLANT
SYR TB 1ML LUER SLIP (SYRINGE) ×3 IMPLANT
WATER STERILE IRR 250ML POUR (IV SOLUTION) ×3 IMPLANT
WIPE NON LINTING 3.25X3.25 (MISCELLANEOUS) ×3 IMPLANT

## 2019-12-07 NOTE — Anesthesia Procedure Notes (Signed)
Procedure Name: MAC Date/Time: 12/07/2019 7:35 AM Performed by: Silvana Newness, CRNA Pre-anesthesia Checklist: Patient identified, Emergency Drugs available, Suction available, Patient being monitored and Timeout performed Patient Re-evaluated:Patient Re-evaluated prior to induction Oxygen Delivery Method: Nasal cannula Placement Confirmation: positive ETCO2

## 2019-12-07 NOTE — Anesthesia Postprocedure Evaluation (Signed)
Anesthesia Post Note  Patient: Erik Collins  Procedure(s) Performed: CATARACT EXTRACTION PHACO AND INTRAOCULAR LENS PLACEMENT (IOC) RIGHT DIABETIC MALYUGIN 6.38 01:05.1 9.8% (Right Eye)     Patient location during evaluation: PACU Anesthesia Type: MAC Level of consciousness: awake and alert and oriented Pain management: satisfactory to patient Vital Signs Assessment: post-procedure vital signs reviewed and stable Respiratory status: spontaneous breathing, nonlabored ventilation and respiratory function stable Cardiovascular status: blood pressure returned to baseline and stable Postop Assessment: Adequate PO intake and No signs of nausea or vomiting Anesthetic complications: no   No complications documented.  Raliegh Ip

## 2019-12-07 NOTE — Anesthesia Preprocedure Evaluation (Signed)
Anesthesia Evaluation  Patient identified by MRN, date of birth, ID band Patient awake    Reviewed: Allergy & Precautions, H&P , NPO status , Patient's Chart, lab work & pertinent test results, reviewed documented beta blocker date and time   Airway Mallampati: II  TM Distance: >3 FB Neck ROM: full    Dental no notable dental hx.    Pulmonary sleep apnea , COPD, former smoker,    Pulmonary exam normal breath sounds clear to auscultation       Cardiovascular Exercise Tolerance: Good hypertension, + CAD, + Past MI, + CABG and +CHF  Normal cardiovascular exam+ dysrhythmias  Rhythm:regular Rate:Normal     Neuro/Psych negative neurological ROS  negative psych ROS   GI/Hepatic Neg liver ROS, GERD  ,  Endo/Other  diabetes  Renal/GU      Musculoskeletal   Abdominal   Peds  Hematology negative hematology ROS (+)   Anesthesia Other Findings   Reproductive/Obstetrics negative OB ROS                             Anesthesia Physical Anesthesia Plan  ASA: III  Anesthesia Plan: MAC   Post-op Pain Management:    Induction:   PONV Risk Score and Plan: 1 and Treatment may vary due to age or medical condition, TIVA and Midazolam  Airway Management Planned:   Additional Equipment:   Intra-op Plan:   Post-operative Plan:   Informed Consent: I have reviewed the patients History and Physical, chart, labs and discussed the procedure including the risks, benefits and alternatives for the proposed anesthesia with the patient or authorized representative who has indicated his/her understanding and acceptance.     Dental Advisory Given  Plan Discussed with: CRNA  Anesthesia Plan Comments:         Anesthesia Quick Evaluation

## 2019-12-07 NOTE — Transfer of Care (Signed)
Immediate Anesthesia Transfer of Care Note  Patient: Erik Collins  Procedure(s) Performed: CATARACT EXTRACTION PHACO AND INTRAOCULAR LENS PLACEMENT (IOC) RIGHT DIABETIC MALYUGIN 6.38 01:05.1 9.8% (Right Eye)  Patient Location: PACU  Anesthesia Type: MAC  Level of Consciousness: awake, alert  and patient cooperative  Airway and Oxygen Therapy: Patient Spontanous Breathing and Patient connected to supplemental oxygen  Post-op Assessment: Post-op Vital signs reviewed, Patient's Cardiovascular Status Stable, Respiratory Function Stable, Patent Airway and No signs of Nausea or vomiting  Post-op Vital Signs: Reviewed and stable  Complications: No complications documented.

## 2019-12-07 NOTE — H&P (Signed)
Erik Collins   Primary Care Physician:  Perrin Maltese, MD Ophthalmologist: Dr. Leandrew Koyanagi  Pre-Procedure History & Physical: HPI:  Erik Collins is a 74 y.o. male here for ophthalmic surgery.   Past Medical History:  Diagnosis Date  . Arthritis    osteoarthritis  . Cancer ([place])    hx lung cancer  . CHF (congestive heart failure) ([place])   . Complication of anesthesia    knee surgery caused arrythmia requiring a temporary pacemaker  . COPD (chronic obstructive pulmonary disease) ([place])   . Coronary artery disease    s/p CABG  . Diabetes mellitus without complication ([place])   . Diverticulosis   . Duodenitis   . Dysrhythmia    during total knee replacement patient required a temporary pacemaker  . GERD (gastroesophageal reflux disease)   . Hypercholesterolemia   . Hyperplastic colon polyp   . Hypertension   . Internal hemorrhoids   . Lung cancer ([place])   . Lung cancer ([place])   . Myocardial infarction [facility])    09/2009 f/up with Dr. Neoma Laming  . Sleep apnea     Past Surgical History:  Procedure Laterality Date  . BACK SURGERY    . CATARACT EXTRACTION W/PHACO Left 11/09/2019   Procedure: CATARACT EXTRACTION PHACO AND INTRAOCULAR LENS PLACEMENT (IOC) LEFT DIABETIC MALYUGIN 5.91 01:01.0 9.6%;  Surgeon: Leandrew Koyanagi, MD;  Location: [place];  Service: Ophthalmology;  Laterality: Left;  . COLONOSCOPY N/A 11/23/2019   Procedure: COLONOSCOPY;  Surgeon: Toledo, [name], MD;  Location: [facility] ENDOSCOPY;  Service: Gastroenterology;  Laterality: N/A;  . COLONOSCOPY WITH PROPOFOL N/A 11/09/2014   Procedure: COLONOSCOPY WITH PROPOFOL;  Surgeon: Josefine Class, MD;  Location: [facility] ENDOSCOPY;  Service: Endoscopy;  Laterality: N/A;  . CORONARY ANGIOPLASTY    . CORONARY ARTERY BYPASS GRAFT  09/2009   quadruple, done at [facility]  . CORONARY ARTERY BYPASS GRAFT    . ESOPHAGOGASTRODUODENOSCOPY (EGD) WITH PROPOFOL N/A 11/09/2014   Procedure:  ESOPHAGOGASTRODUODENOSCOPY (EGD) WITH PROPOFOL;  Surgeon: Josefine Class, MD;  Location: [facility] ENDOSCOPY;  Service: Endoscopy;  Laterality: N/A;  . left lower lobectomy  09/2009   at [facility]  . LUMBAR LAMINECTOMY/DECOMPRESSION MICRODISCECTOMY  12/17/2011   Procedure: LUMBAR LAMINECTOMY/DECOMPRESSION MICRODISCECTOMY 1 LEVEL;  Surgeon: Eustace Moore, MD;  Location: [place] NEURO ORS;  Service: Neurosurgery;  Laterality: Right;  Right Lumbar five-sacral one microdiscectomy  . TEMPORARY PACEMAKER Right 07/10/2017   Procedure: TEMPORARY PACEMAKER;  Surgeon: Isaias Cowman, MD;  Location: [place] CV LAB;  Service: Cardiovascular;  Laterality: Right;  . TOTAL KNEE ARTHROPLASTY Left 07/08/2017   Procedure: TOTAL KNEE ARTHROPLASTY;  Surgeon: Earnestine Leys, MD;  Location: [facility] ORS;  Service: Orthopedics;  Laterality: Left;    Prior to Admission medications   Medication Sig Start Date End Date Taking? Authorizing Provider  acetaminophen (TYLENOL 8 HOUR ARTHRITIS PAIN) 650 MG CR tablet Take 1,300 mg by mouth 2 (two) times daily.   Yes [provider]  aspirin EC 81 MG tablet Take 81 mg by mouth daily.   Yes [provider]  Bempedoic Acid (NEXLETOL PO) Take by mouth.   Yes [provider]  carvedilol (COREG) 12.5 MG tablet  08/02/18  Yes [provider]  clonazePAM (KLONOPIN) 1 MG tablet Take 1 mg by mouth at bedtime. For sleep   Yes [provider]  empagliflozin (JARDIANCE) 10 MG TABS tablet Take 10 mg by mouth daily.   Yes [provider]  ezetimibe (ZETIA) 10  MG tablet Take 10 mg by mouth daily.   Yes [provider]  famotidine (PEPCID) 20 MG tablet Take 1 tablet (20 mg total) by mouth 2 (two) times daily. 07/12/17 11/29/19 Yes Bettey Costa, MD  gabapentin (NEURONTIN) 300 MG capsule  10/04/18  Yes [provider]  Insulin Glargine (TOUJEO MAX SOLOSTAR) 300 UNIT/ML SOPN Inject 0-40 Units into the skin See admin instructions.  Sliding scale insulin per patient  3 units if blood sugar is greater than 150 during the day, if blood sugars are less= no insulin 40 units if blood sugar is greater than 150 at night, if blood sugars are less=no insulin   Yes [provider]  lisinopril (PRINIVIL,ZESTRIL) 5 MG tablet Take 1 tablet (5 mg total) by mouth daily. 07/12/17 11/29/19 Yes Mody, Ulice Bold, MD  losartan (COZAAR) 50 MG tablet Take 50 mg by mouth daily.   Yes [provider]  meclizine (ANTIVERT) 25 MG tablet Take 1 tablet (25 mg total) by mouth 3 (three) times daily as needed for dizziness. 12/31/18  Yes Arta Silence, MD  meloxicam (MOBIC) 15 MG tablet  09/27/18  Yes [provider]  metFORMIN (GLUCOPHAGE) 1000 MG tablet Take 1,000 mg by mouth 2 (two) times daily.   Yes [provider]  Omega-3 Fatty Acids (FISH OIL) 1200 MG CAPS Take 1,200 mg by mouth 2 (two) times daily.   Yes [provider]  omeprazole (PRILOSEC) 20 MG capsule Take 20 mg by mouth daily.   Yes [provider]  spironolactone (ALDACTONE) 25 MG tablet Take 25 mg by mouth daily.   Yes [provider]  tiZANidine (ZANAFLEX) 2 MG tablet Take 4 mg by mouth at bedtime.   Yes [provider]  vitamin B-12 (CYANOCOBALAMIN) 500 MCG tablet Take 500 mcg by mouth daily.   Yes [provider]  oxyCODONE (OXY IR/ROXICODONE) 5 MG immediate release tablet Take 1-2 tablets (5-10 mg total) by mouth every 6 (six) hours as needed for moderate pain or severe pain. Patient not taking: Reported on 10/07/2018 07/12/17   Bettey Costa, MD    Allergies as of 11/10/2019  . (No Known Allergies)    Family History  Problem Relation Age of Onset  . Heart disease Mother   . Diabetes Mother   . Heart disease Father   . Stroke Father   . Heart attack Father     Social History   Socioeconomic History  . Marital status: Married    Spouse name: Not on file  . Number of children: Not on file  .  Years of education: Not on file  . Highest education level: Not on file  Occupational History  . Not on file  Tobacco Use  . Smoking status: Former Smoker    Quit date: 09/19/2009    Years since quitting: 10.2  . Smokeless tobacco: Never Used  Vaping Use  . Vaping Use: Never used  Substance and Sexual Activity  . Alcohol use: No  . Drug use: No  . Sexual activity: Not on file  Other Topics Concern  . Not on file  Social History Narrative  . Not on file   Social Determinants of Health   Financial Resource Strain:   . Difficulty of Paying Living Expenses: Not on file  Food Insecurity:   . Worried About Charity fundraiser in the Last Year: Not on file  . Ran Out of Food in the Last Year: Not on file  Transportation Needs:   .  Lack of Transportation (Medical): Not on file  . Lack of Transportation (Non-Medical): Not on file  Physical Activity:   . Days of Exercise per Week: Not on file  . Minutes of Exercise per Session: Not on file  Stress:   . Feeling of Stress : Not on file  Social Connections:   . Frequency of Communication with Friends and Family: Not on file  . Frequency of Social Gatherings with Friends and Family: Not on file  . Attends Religious Services: Not on file  . Active Member of Clubs or Organizations: Not on file  . Attends Archivist Meetings: Not on file  . Marital Status: Not on file  Intimate Partner Violence:   . Fear of Current or Ex-Partner: Not on file  . Emotionally Abused: Not on file  . Physically Abused: Not on file  . Sexually Abused: Not on file    Review of Systems: See HPI, otherwise negative ROS  Physical Exam: BP (!) 142/68   Pulse 64   Temp (!) 97.5 F (36.4 C) (Temporal)   Resp 16   Ht 5\' 11"  (1.803 m)   Wt 86.6 kg   SpO2 95%   BMI 26.64 kg/m  General:   Alert,  pleasant and cooperative in NAD Head:  Normocephalic and atraumatic. Lungs:  Clear to auscultation.    Heart:  Regular rate and rhythm.    Impression/Plan: Jarome Matin Daudelin is here for ophthalmic surgery.  Risks, benefits, limitations, and alternatives regarding ophthalmic surgery have been reviewed with the patient.  Questions have been answered.  All parties agreeable.   Leandrew Koyanagi, MD  12/07/2019, 7:29 AM

## 2019-12-07 NOTE — Op Note (Signed)
OPERATIVE NOTE  Erik Collins [id number] 12/07/2019   PREOPERATIVE DIAGNOSIS:    Nuclear Sclerotic Cataract Right eye with miotic pupil.        H25.11  POSTOPERATIVE DIAGNOSIS: Nuclear Sclerotic Cataract Right eye with miotic pupil.          PROCEDURE:  Phacoemusification with posterior chamber intraocular lens placement of the right eye which required pupil stretching with the Malyugin pupil expansion device. Ultrasound time: Procedure(s): CATARACT EXTRACTION PHACO AND INTRAOCULAR LENS PLACEMENT (IOC) RIGHT DIABETIC MALYUGIN 6.38 01:05.1 9.8% (Right)  LENS:   Implant Name Type Inv. Item Serial No. Manufacturer Lot No. LRB No. Used Action  LENS IOL TECNIS EYHANCE 24.0 - [id number] Intraocular Lens LENS IOL TECNIS EYHANCE 24.0 [id number] [name]   Right 1 Implanted      SURGEON:  Wyonia Hough, MD   ANESTHESIA:  Topical with tetracaine drops and 2% Xylocaine jelly, augmented with 1% preservative-free intracameral lidocaine.   COMPLICATIONS:  None.   DESCRIPTION OF PROCEDURE:  The patient was identified in the holding room and transported to the operating room and placed in the supine position under the operating microscope. Theright eye was identified as the operative eye and it was prepped and draped in the usual sterile ophthalmic fashion.   A 1 millimeter clear-corneal paracentesis was made at the 12:00 position.  0.5 ml of preservative-free 1% lidocaine was injected into the anterior chamber. The anterior chamber was filled with Viscoat viscoelastic.  A 2.4 millimeter keratome was used to make a near-clear corneal incision at the 9:00 position. A Malyugin pupil expander was then placed through the main incision and into the anterior chamber of the eye.  The edge of the iris was secured on the lip of the pupil expander and it was released, thereby expanding the pupil to approximately 7 millimeters for completion of the cataract surgery.  Additional Viscoat was placed in the  anterior chamber.  A cystotome and capsulorrhexis forceps were used to make a curvilinear capsulorrhexis.   Balanced salt solution was used to hydrodissect and hydrodelineate the lens nucleus.   Phacoemulsification was used in stop and chop fashion to remove the lens, nucleus and epinucleus.  The remaining cortex was aspirated using the irrigation aspiration handpiece.  Additional Provisc was placed into the eye to distend the capsular bag for lens placement.  A lens was then injected into the capsular bag.  The pupil expanding ring was removed using a Kuglen hook and insertion device. The remaining viscoelastic was aspirated from the capsular bag and the anterior chamber.  The anterior chamber was filled with balanced salt solution to inflate to a physiologic pressure.  Wounds were hydrated with balanced salt solution.  The anterior chamber was inflated to a physiologic pressure with balanced salt solution.  No wound leaks were noted.Cefuroxime 0.1 ml of a 10mg /ml solution was injected into the anterior chamber for a dose of 1 mg of intracameral antibiotic at the completion of the case. Timolol and Brimonidine drops were applied to the eye.  The patient was taken to the recovery room in stable condition without complications of anesthesia or surgery.  [name] 12/07/2019, 7:53 AM

## 2019-12-08 ENCOUNTER — Encounter: Payer: Self-pay | Admitting: Ophthalmology

## 2020-05-18 DIAGNOSIS — E1140 Type 2 diabetes mellitus with diabetic neuropathy, unspecified: Secondary | ICD-10-CM | POA: Diagnosis not present

## 2020-05-18 DIAGNOSIS — E785 Hyperlipidemia, unspecified: Secondary | ICD-10-CM | POA: Diagnosis not present

## 2020-05-18 DIAGNOSIS — I5022 Chronic systolic (congestive) heart failure: Secondary | ICD-10-CM | POA: Diagnosis not present

## 2020-05-18 DIAGNOSIS — I10 Essential (primary) hypertension: Secondary | ICD-10-CM | POA: Diagnosis not present

## 2020-05-18 DIAGNOSIS — R799 Abnormal finding of blood chemistry, unspecified: Secondary | ICD-10-CM | POA: Diagnosis not present

## 2020-08-21 DIAGNOSIS — Z20822 Contact with and (suspected) exposure to covid-19: Secondary | ICD-10-CM | POA: Diagnosis not present

## 2020-11-17 ENCOUNTER — Emergency Department: Payer: Medicare Other

## 2020-11-17 ENCOUNTER — Emergency Department
Admission: EM | Admit: 2020-11-17 | Discharge: 2020-11-17 | Disposition: A | Discharge status: Home / Self Care | Payer: Medicare Other | Attending: Emergency Medicine | Admitting: Emergency Medicine

## 2020-11-17 ENCOUNTER — Other Ambulatory Visit: Payer: Self-pay

## 2020-11-17 DIAGNOSIS — Z794 Long term (current) use of insulin: Secondary | ICD-10-CM | POA: Insufficient documentation

## 2020-11-17 DIAGNOSIS — R531 Weakness: Secondary | ICD-10-CM | POA: Diagnosis not present

## 2020-11-17 DIAGNOSIS — Z79899 Other long term (current) drug therapy: Secondary | ICD-10-CM | POA: Diagnosis not present

## 2020-11-17 DIAGNOSIS — I2510 Atherosclerotic heart disease of native coronary artery without angina pectoris: Secondary | ICD-10-CM | POA: Insufficient documentation

## 2020-11-17 DIAGNOSIS — I5020 Unspecified systolic (congestive) heart failure: Secondary | ICD-10-CM | POA: Insufficient documentation

## 2020-11-17 DIAGNOSIS — Z7982 Long term (current) use of aspirin: Secondary | ICD-10-CM | POA: Diagnosis not present

## 2020-11-17 DIAGNOSIS — Z96652 Presence of left artificial knee joint: Secondary | ICD-10-CM | POA: Insufficient documentation

## 2020-11-17 DIAGNOSIS — Z85118 Personal history of other malignant neoplasm of bronchus and lung: Secondary | ICD-10-CM | POA: Diagnosis not present

## 2020-11-17 DIAGNOSIS — I110 Hypertensive heart disease with heart failure: Secondary | ICD-10-CM | POA: Diagnosis not present

## 2020-11-17 DIAGNOSIS — R202 Paresthesia of skin: Secondary | ICD-10-CM | POA: Diagnosis not present

## 2020-11-17 DIAGNOSIS — J449 Chronic obstructive pulmonary disease, unspecified: Secondary | ICD-10-CM | POA: Diagnosis not present

## 2020-11-17 DIAGNOSIS — Z951 Presence of aortocoronary bypass graft: Secondary | ICD-10-CM | POA: Diagnosis not present

## 2020-11-17 DIAGNOSIS — E119 Type 2 diabetes mellitus without complications: Secondary | ICD-10-CM | POA: Insufficient documentation

## 2020-11-17 DIAGNOSIS — Z87891 Personal history of nicotine dependence: Secondary | ICD-10-CM | POA: Diagnosis not present

## 2020-11-17 DIAGNOSIS — Z7984 Long term (current) use of oral hypoglycemic drugs: Secondary | ICD-10-CM | POA: Insufficient documentation

## 2020-11-17 LAB — TROPONIN I (HIGH SENSITIVITY)
Troponin I (High Sensitivity): 11 ng/L (ref <18)
Troponin I (High Sensitivity): 13 ng/L (ref <18)

## 2020-11-17 LAB — BASIC METABOLIC PANEL
Anion gap: 10 (ref 5–15)
BUN: 46 mg/dL — ABNORMAL HIGH (ref 8–23)
CO2: 22 mmol/L (ref 22–32)
Calcium: 9.2 mg/dL (ref 8.9–10.3)
Chloride: 106 mmol/L (ref 98–111)
Creatinine, Ser: 1.59 mg/dL — ABNORMAL HIGH (ref 0.61–1.24)
GFR, Estimated: 45 mL/min — ABNORMAL LOW (ref >60)
Glucose, Bld: 100 mg/dL — ABNORMAL HIGH (ref 70–99)
Potassium: 5.1 mmol/L (ref 3.5–5.1)
Sodium: 138 mmol/L (ref 135–145)

## 2020-11-17 LAB — CBC
HCT: 37.9 % — ABNORMAL LOW (ref 39.0–52.0)
Hemoglobin: 12.6 g/dL — ABNORMAL LOW (ref 13.0–17.0)
MCH: 30.7 pg (ref 26.0–34.0)
MCHC: 33.2 g/dL (ref 30.0–36.0)
MCV: 92.4 fL (ref 80.0–100.0)
Platelets: 264 10*3/uL (ref 150–400)
RBC: 4.1 MIL/uL — ABNORMAL LOW (ref 4.22–5.81)
RDW: 13.4 % (ref 11.5–15.5)
WBC: 10.9 10*3/uL — ABNORMAL HIGH (ref 4.0–10.5)
nRBC: 0 % (ref 0.0–0.2)

## 2020-11-17 NOTE — ED Triage Notes (Addendum)
Pt comes ems from home with left arm numbness starting about 12pm today. Negative stroke screen with EMS. Ambulatory to ambulance. Cardiac hx. EKG WNL with PVCs. No hx of stroke. No blood thinners. No pain. CBG 115. 156/72. HR 67. Numbness and weakness is improving.   Jenise, PA in triage room.

## 2020-11-17 NOTE — ED Notes (Signed)
Pt given ginger ale after passing swallow screen.

## 2020-11-17 NOTE — ED Provider Notes (Signed)
Emergency Medicine Provider Triage Evaluation Note  DIOGENES WHIRLEY, a 75 y.o. male  was evaluated in triage.  Pt complains of LUE paralysis an numbness. He notes onset at about 12 pm while attempting to lift box at work. He now presents with resolved paralysis and residual forearm numbness.   Review of Systems  Positive: LUE  weakness Negative: Facial droop, slurred speech  Physical Exam  BP 140/61   Pulse 64   Temp 98.7 F (37.1 C) (Oral)   Resp 18   Ht 5\' 11"  (1.803 m)   Wt 86 kg   SpO2 94%   BMI 26.44 kg/m  Gen:   Awake, no distress  NAD Resp:  Normal effort CTA MSK:   Moves extremities without difficulty  Other:  NEURO: CN II-XI grossly intact no promotor drift  Medical Decision Making  Medically screening exam initiated at 3:00 PM.  Appropriate orders placed.  Develle Sievers Brannigan was informed that the remainder of the evaluation will be completed by another provider, this initial triage assessment does not replace that evaluation, and the importance of remaining in the ED until their evaluation is complete.  Patient with ED evaluation of improved LUE paralysis and numbness.    Melvenia Needles, PA-C 11/17/20 1503    Carrie Mew, MD 11/17/20 1756

## 2020-11-17 NOTE — ED Provider Notes (Signed)
[facility] Emergency Department Provider Note  ____________________________________________  Time seen: Approximately 5:47 PM  I have reviewed the triage vital signs and the nursing notes.   HISTORY  Chief Complaint Numbness    Level 5 Caveat: Portions of the History and Physical including HPI and review of systems are unable to be completely obtained due to patient being a poor historian   HPI Erik Collins is a 75 y.o. male with a history of lung cancer, CHF, COPD, CAD status post CABG, diabetes, hypertension, carotid stenosis who comes ED complaining of left arm weakness and numbness that started about 12:00 PM today.  Patient reports that this was after lifting a heavy box while working, but afterward he had difficulty using his left arm.  He reports that he is feeling better now.  Denies any weakness or paresthesia in the left leg.  Notes no headache or vision change, no neck pain, no change in balance or coordination    Past Medical History:  Diagnosis Date   Arthritis    osteoarthritis   Cancer (HCC)    hx lung cancer   CHF (congestive heart failure) (HCC)    Complication of anesthesia    knee surgery caused arrythmia requiring a temporary pacemaker   COPD (chronic obstructive pulmonary disease) ([place])    Coronary artery disease    s/p CABG   Diabetes mellitus without complication ([place])    Diverticulosis    Duodenitis    Dysrhythmia    during total knee replacement patient required a temporary pacemaker   GERD (gastroesophageal reflux disease)    Hypercholesterolemia    Hyperplastic colon polyp    Hypertension    Internal hemorrhoids    Lung cancer ([place])    Lung cancer ([place])    Myocardial infarction ([place])    09/2009 f/up with Dr. Neoma Laming   Sleep apnea      Patient Active Problem List   Diagnosis Date Noted   Congestive heart failure ([place]) 10/07/2018   COPD (chronic obstructive pulmonary disease) ([place]) 10/07/2018   Coronary  disease 10/07/2018   Diabetes mellitus type 2, uncomplicated ([place]) 93/73/4287   Osteoarthritis 10/07/2018   Total knee replacement status 07/08/2017   Carotid stenosis 10/05/2016   Essential hypertension 10/05/2016   Hyperlipidemia 10/05/2016   Diabetes ([place]) 10/05/2016     Past Surgical History:  Procedure Laterality Date   BACK SURGERY     CATARACT EXTRACTION W/PHACO Left 11/09/2019   Procedure: CATARACT EXTRACTION PHACO AND INTRAOCULAR LENS PLACEMENT (IOC) LEFT DIABETIC MALYUGIN 5.91 01:01.0 9.6%;  Surgeon: Leandrew Koyanagi, MD;  Location: [place];  Service: Ophthalmology;  Laterality: Left;   CATARACT EXTRACTION W/PHACO Right 12/07/2019   Procedure: CATARACT EXTRACTION PHACO AND INTRAOCULAR LENS PLACEMENT (IOC) RIGHT DIABETIC MALYUGIN 6.38 01:05.1 9.8%;  Surgeon: Leandrew Koyanagi, MD;  Location: [place];  Service: Ophthalmology;  Laterality: Right;   COLONOSCOPY N/A 11/23/2019   Procedure: COLONOSCOPY;  Surgeon: Tol[place], [name], MD;  Location: [facility] ENDOSCOPY;  Service: Gastroenterology;  Laterality: N/A;   COLONOSCOPY WITH PROPOFOL N/A 11/09/2014   Procedure: COLONOSCOPY WITH PROPOFOL;  Surgeon: Josefine Class, MD;  Location[facility] ENDOSCOPY;  Service: Endoscopy;  Laterality: N/A;   CORONARY ANGIOPLASTY     CORONARY ARTERY BYPASS GRAFT  09/2009   quadruple, done at [place]     ESOPHAGOGASTRODUODENOSCOPY (EGD) WITH PROPOFOL N/A 11/09/2014   Procedure: ESOPHAGOGASTRODUODENOSCOPY (EGD) WITH PROPOFOL;  Surgeon: Josefine Class, MD;  Location: [facility]  ENDOSCOPY;  Service: Endoscopy;  Laterality: N/A;   left lower lobectomy  09/2009   at [place] MICRODISCECTOMY  12/17/2011   Procedure: LUMBAR LAMINECTOMY/DECOMPRESSION MICRODISCECTOMY 1 LEVEL;  Surgeon: Eustace Moore, MD;  Location: [place] NEURO ORS;  Service: Neurosurgery;  Laterality: Right;  Right Lumbar five-sacral one microdiscectomy   TEMPORARY  PACEMAKER Right 07/10/2017   Procedure: TEMPORARY PACEMAKER;  Surgeon: Isaias Cowman, MD;  Location: [place] CV LAB;  Service: Cardiovascular;  Laterality: Right;   TOTAL KNEE ARTHROPLASTY Left 07/08/2017   Procedure: TOTAL KNEE ARTHROPLASTY;  Surgeon: Earnestine Leys, MD;  Location: [facility] ORS;  Service: Orthopedics;  Laterality: Left;     Prior to Admission medications   Medication Sig Start Date End Date Taking? Authorizing Provider  acetaminophen (TYLENOL 8 HOUR ARTHRITIS PAIN) 650 MG CR tablet Take 1,300 mg by mouth 2 (two) times daily.    [provider]  aspirin EC 81 MG tablet Take 81 mg by mouth daily.    [provider]  Bempedoic Acid (NEXLETOL PO) Take by mouth.    [provider]  carvedilol (COREG) 12.5 MG tablet  08/02/18   [provider]  clonazePAM (KLONOPIN) 1 MG tablet Take 1 mg by mouth at bedtime. For sleep    [provider]  empagliflozin (JARDIANCE) 10 MG TABS tablet Take 10 mg by mouth daily.    [provider]  ezetimibe (ZETIA) 10 MG tablet Take 10 mg by mouth daily.    [provider]  famotidine (PEPCID) 20 MG tablet Take 1 tablet (20 mg total) by mouth 2 (two) times daily. 07/12/17 11/29/19  Bettey Costa, MD  gabapentin (NEURONTIN) 300 MG capsule  10/04/18   [provider]  Insulin Glargine (TOUJEO MAX SOLOSTAR) 300 UNIT/ML SOPN Inject 0-40 Units into the skin See admin instructions. Sliding scale insulin per patient  3 units if blood sugar is greater than 150 during the day, if blood sugars are less= no insulin 40 units if blood sugar is greater than 150 at night, if blood sugars are less=no insulin    [provider]  lisinopril (PRINIVIL,ZESTRIL) 5 MG tablet Take 1 tablet (5 mg total) by mouth daily. 07/12/17 11/29/19  Bettey Costa, MD  losartan (COZAAR) 50 MG tablet Take 50 mg by mouth daily.    [provider]  meclizine (ANTIVERT) 25 MG tablet Take 1 tablet (25 mg  total) by mouth 3 (three) times daily as needed for dizziness. 12/31/18   Arta Silence, MD  meloxicam (MOBIC) 15 MG tablet  09/27/18   [provider]  metFORMIN (GLUCOPHAGE) 1000 MG tablet Take 1,000 mg by mouth 2 (two) times daily.    [provider]  Omega-3 Fatty Acids (FISH OIL) 1200 MG CAPS Take 1,200 mg by mouth 2 (two) times daily.    [provider]  omeprazole (PRILOSEC) 20 MG capsule Take 20 mg by mouth daily.    [provider]  oxyCODONE (OXY IR/ROXICODONE) 5 MG immediate release tablet Take 1-2 tablets (5-10 mg total) by mouth every 6 (six) hours as needed for moderate pain or severe pain. Patient not taking: Reported on 10/07/2018 07/12/17   Bettey Costa, MD  spironolactone (ALDACTONE) 25 MG tablet Take 25 mg by mouth daily.    [provider]  tiZANidine (ZANAFLEX) 2 MG tablet Take 4 mg by mouth at bedtime.    [provider]  vitamin B-12 (CYANOCOBALAMIN) 500 MCG tablet Take 500 mcg by mouth daily.  [provider]     Allergies Patient has no known allergies.   Family History  Problem Relation Age of Onset   Heart disease Mother    Diabetes Mother    Heart disease Father    Stroke Father    Heart attack Father     Social History Social History   Tobacco Use   Smoking status: Former    Types: Cigarettes    Quit date: 09/19/2009    Years since quitting: 11.1   Smokeless tobacco: Never  Vaping Use   Vaping Use: Never used  Substance Use Topics   Alcohol use: No   Drug use: No    Review of Systems Level 5 Caveat: Portions of the History and Physical including HPI and review of systems are unable to be completely obtained due to patient being a poor historian   Constitutional:   No known fever.  ENT:   No rhinorrhea. Cardiovascular:   No chest pain or syncope. Respiratory:   No dyspnea or cough. Gastrointestinal:   Negative for abdominal pain, vomiting and diarrhea.  Musculoskeletal:    Negative for focal pain or swelling ____________________________________________   PHYSICAL EXAM:  VITAL SIGNS: ED Triage Vitals  Enc Vitals Group     BP 11/17/20 1451 140/61     Pulse Rate 11/17/20 1451 64     Resp 11/17/20 1451 18     Temp 11/17/20 1451 98.7 F (37.1 C)     Temp Source 11/17/20 1451 Oral     SpO2 11/17/20 1451 94 %     Weight 11/17/20 1449 189 lb 9.5 oz (86 kg)     Height 11/17/20 1449 5\' 11"  (1.803 m)     Head Circumference --      Peak Flow --      Pain Score 11/17/20 1449 0     Pain Loc --      Pain Edu? --      Excl. in [place]? --     Vital signs reviewed, nursing assessments reviewed.   Constitutional:   Alert and oriented. Non-toxic appearance. Eyes:   Conjunctivae are normal. EOMI. PERRL. ENT      Head:   Normocephalic and atraumatic.      Nose:   No congestion/rhinnorhea.       Mouth/Throat:   MMM, no pharyngeal erythema. No peritonsillar mass.       Neck:   No meningismus. Full ROM.  No radicular pain with axial loading of the neck Hematological/Lymphatic/Immunilogical:   No cervical lymphadenopathy. Cardiovascular:   RRR. Symmetric bilateral radial and DP pulses.  No murmurs. Cap refill less than 2 seconds. Respiratory:   Normal respiratory effort without tachypnea/retractions. Breath sounds are clear and equal bilaterally. No wheezes/rales/rhonchi. Gastrointestinal:   Soft and nontender. Non distended. There is no CVA tenderness.  No rebound, rigidity, or guarding. Genitourinary:   deferred Musculoskeletal:   Normal range of motion in all extremities. No joint effusions.  No lower extremity tenderness.  No edema. Neurologic:   Normal speech and language.  Motor grossly intact. Sensation symmetric Normal finger-to-nose, normal gait No acute focal neurologic deficits are appreciated. NIH stroke scale 0 Skin:    Skin is warm, dry and intact. No rash noted.  No petechiae, purpura, or bullae.  ____________________________________________     LABS (pertinent positives/negatives) (all labs ordered are listed, but only abnormal results are displayed) Labs Reviewed  BASIC METABOLIC PANEL - Abnormal; Notable for the following components:      Result  Value   Glucose, Bld 100 (*)    BUN 46 (*)    Creatinine, Ser 1.59 (*)    GFR, Estimated 45 (*)    All other components within normal limits  CBC - Abnormal; Notable for the following components:   WBC 10.9 (*)    RBC 4.10 (*)    Hemoglobin 12.6 (*)    HCT 37.9 (*)    All other components within normal limits  TROPONIN I (HIGH SENSITIVITY)  TROPONIN I (HIGH SENSITIVITY)   ____________________________________________   EKG  Interpreted by me Sinus rhythm rate of 63, normal axis and intervals.  Normal QRS ST segments and T waves.  2 PVCs on the strip.  ____________________________________________    RADIOLOGY  DG Chest 2 View  Result Date: 11/17/2020 CLINICAL DATA:  Left upper extremity weakness. EXAM: CHEST - 2 VIEW COMPARISON:  Chest radiograph 07/12/2017. FINDINGS: Normal cardiac and mediastinal contours status post median sternotomy. There is a 2.2 cm nodular opacity within the right upper hemithorax. Blunting of left costophrenic angle. No pleural effusion or pneumothorax. Thoracic spine degenerative changes. IMPRESSION: There is a new 2.2 cm nodular opacity within the right upper hemithorax. Recommend further evaluation with chest CT. No acute process. Electronically Signed   By: Lovey Newcomer M.D.   On: 11/17/2020 15:56   CT HEAD WO CONTRAST (5MM)  Result Date: 11/17/2020 CLINICAL DATA:  Left upper extremity paralysis. EXAM: CT HEAD WITHOUT CONTRAST TECHNIQUE: Contiguous axial images were obtained from the base of the skull through the vertex without intravenous contrast. COMPARISON:  Brain CT 12/31/2018 FINDINGS: Brain: Ventricles and sulci are appropriate for patient's age. No evidence for acute cortically based infarct, intracranial hemorrhage, mass lesion or  mass-effect. Unchanged right frontal lobe white matter infarct. Vascular: Unremarkable Skull: Intact. Sinuses/Orbits: Opacification of the maxillary sinuses bilaterally. Remainder the paranasal sinuses are well aerated. Mastoid air cells are unremarkable. Other: None. IMPRESSION: No acute intracranial process. Complete opacification of the maxillary sinuses bilaterally. Electronically Signed   By: Lovey Newcomer M.D.   On: 11/17/2020 15:49   MR BRAIN WO CONTRAST  Result Date: 11/17/2020 CLINICAL DATA:  Initial evaluation for neuro deficit, stroke suspected, left upper extremity paresthesias and weakness. EXAM: MRI HEAD WITHOUT CONTRAST TECHNIQUE: Multiplanar, multiecho pulse sequences of the brain and surrounding structures were obtained without intravenous contrast. COMPARISON:  CT from earlier the same day. FINDINGS: Brain: Diffuse prominence of the CSF containing spaces compatible generalized age-related cerebral atrophy. Patchy and confluent T2/FLAIR hyperintensity within the periventricular deep white matter both cerebral hemispheres most consistent with chronic small vessel ischemic disease. Superimposed remote lacunar infarct present at the right pons. Chronic right MCA distribution infarcts involving the cortical and subcortical right frontal lobe noted as well. No abnormal foci of restricted diffusion to suggest acute or subacute ischemia. Gray-white matter differentiation otherwise maintained. No other areas of remote cortical infarction. No acute intracranial hemorrhage. Multiple scattered punctate chronic micro hemorrhages noted involving both cerebral hemispheres as well as the cerebellum, nonspecific. No mass lesion, midline shift or mass effect. No hydrocephalus or extra-axial fluid collection. Pituitary gland and suprasellar region within normal limits. Midline structures intact. Vascular: Abnormal flow void within the right ICA to the siphon, consistent with chronic occlusion, stable from previous.  Major intracranial vascular flow voids are otherwise maintained. Skull and upper cervical spine: Craniocervical junction within normal limits. Bone marrow signal intensity normal. No scalp soft tissue abnormality. Sinuses/Orbits: Patient status post bilateral ocular lens replacement. Globes and orbital soft tissues demonstrate  no acute finding. Chronic maxillary sinusitis noted. Trace left mastoid effusion noted, of doubtful significance. Inner ear structures grossly normal. Other: None. IMPRESSION: 1. No acute intracranial abnormality. 2. Chronic right frontal lobe infarcts, with additional chronic lacunar infarct at the right pons. 3. 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 4. Underlying age-related cerebral atrophy with chronic small vessel ischemic disease. 5. Multiple scattered punctate chronic micro hemorrhages involving both cerebral hemispheres as well as the cerebellum, nonspecific. Findings could be related to chronic poorly controlled hypertension or possibly cerebral amyloid angiopathy. Electronically Signed   By: Jeannine Boga M.D.   On: 11/17/2020 19:37    ____________________________________________   PROCEDURES Procedures  ____________________________________________  DIFFERENTIAL DIAGNOSIS   Intracranial hemorrhage, ischemic stroke, cervical strain, fatigue  CLINICAL IMPRESSION / ASSESSMENT AND PLAN / ED COURSE  Medications ordered in the ED: Medications - No data to display  Pertinent labs & imaging results that were available during my care of the patient were reviewed by me and considered in my medical decision making (see chart for details).   Erik Collins was evaluated in Emergency Department on 11/17/2020 for the symptoms described in the history of present illness. He was evaluated in the context of the global COVID-19 pandemic, which necessitated consideration that the patient might be at risk for infection with the SARS-CoV-2 virus that causes  COVID-19. Institutional protocols and algorithms that pertain to the evaluation of patients at risk for COVID-19 are in a state of rapid change based on information released by regulatory bodies including the CDC and federal and state organizations. These policies and algorithms were followed during the patient's care in the ED.   Patient presents with episode of left arm numbness and paresthesia after lifting heavy box which lasted for about 3 hours.  Stroke scale is currently 0, symptoms have resolved.  Exam is reassuring.  Not a candidate for tPA or endovascular intervention.  Due to his comorbidities including severe carotid stenosis, will obtain an MRI today.  If negative he can follow-up with his doctor.  ----------------------------------------- 8:20 PM on 11/17/2020 ----------------------------------------- MRI shows sequelae of prior infarcts, no acute infarct or other pathology.      ____________________________________________   FINAL CLINICAL IMPRESSION(S) / ED DIAGNOSES    Final diagnoses:  Paresthesia     ED Discharge Orders     None       Portions of this note were generated with dragon dictation software. Dictation errors may occur despite best attempts at proofreading.   Carrie Mew, MD 11/17/20 2020

## 2020-11-17 NOTE — ED Notes (Signed)
Per [place], [place], no code stroke at this time. Heart work up with head CT ordered.

## 2020-11-17 NOTE — Discharge Instructions (Addendum)
Your lab test, CT scan of the head, and MRI of the brain are all unremarkable today.  Please follow-up with your doctor this week for continued monitoring of your symptoms.  Continue taking all of your home medications as prescribed.

## 2020-12-18 ENCOUNTER — Encounter (HOSPITAL_COMMUNITY): Payer: Self-pay | Admitting: Radiology

## 2020-12-26 ENCOUNTER — Ambulatory Visit: Payer: Self-pay | Admitting: *Deleted

## 2020-12-26 NOTE — Telephone Encounter (Signed)
Pt called stating that he is needing to have more info in regards to the incidental findings letter he received. Pt states that he has contacted his PCP and [facility] and has not been receiving the run around regarding this letter. Please advise.

## 2020-12-26 NOTE — Telephone Encounter (Signed)
Patient is calling regarding the letter he received. Patient cardiologist saw the same thing and has referred patient to thoracic surgical center. Patient states she is having a hard time getting seen. Call to that office- Manuela Schwartz is going to help him- call transferred. Reason for Disposition  General information question, no triage required and triager able to answer question  Answer Assessment - Initial Assessment Questions 1. REASON FOR CALL or QUESTION: "What is your reason for calling today?" or "How can I best help you?" or "What question do you have that I can help answer?"     Questions about how to get referral- he needs help- advised PCP should be able to help. Patient states his cardiologist has referred him- the office has the problem.  Protocols used: Information Only Call - No Triage-A-AH

## 2021-01-03 ENCOUNTER — Other Ambulatory Visit: Payer: Self-pay | Admitting: Cardiovascular Disease

## 2021-01-03 DIAGNOSIS — R918 Other nonspecific abnormal finding of lung field: Secondary | ICD-10-CM

## 2021-01-07 ENCOUNTER — Ambulatory Visit (HOSPITAL_COMMUNITY)
Admission: RE | Admit: 2021-01-07 | Discharge: 2021-01-07 | Disposition: A | Discharge status: Home / Self Care | Payer: Medicare Other | Source: Ambulatory Visit | Attending: Cardiovascular Disease | Admitting: Cardiovascular Disease

## 2021-01-07 ENCOUNTER — Other Ambulatory Visit: Payer: Self-pay

## 2021-01-07 DIAGNOSIS — R918 Other nonspecific abnormal finding of lung field: Secondary | ICD-10-CM | POA: Diagnosis not present

## 2021-01-07 LAB — GLUCOSE, CAPILLARY: Glucose-Capillary: 123 mg/dL — ABNORMAL HIGH (ref 70–99)

## 2021-01-07 MED ORDER — FLUDEOXYGLUCOSE F - 18 (FDG) INJECTION
9.0000 | Freq: Once | INTRAVENOUS | Status: AC | PRN
  Administered 2021-01-07: 9.34 via INTRAVENOUS

## 2021-01-08 DIAGNOSIS — C3490 Malignant neoplasm of unspecified part of unspecified bronchus or lung: Secondary | ICD-10-CM | POA: Insufficient documentation

## 2021-01-09 DIAGNOSIS — R918 Other nonspecific abnormal finding of lung field: Secondary | ICD-10-CM | POA: Diagnosis not present

## 2021-01-09 DIAGNOSIS — J432 Centrilobular emphysema: Secondary | ICD-10-CM | POA: Diagnosis not present

## 2021-01-15 LAB — PULMONARY FUNCTION TEST

## 2021-01-16 ENCOUNTER — Ambulatory Visit (INDEPENDENT_AMBULATORY_CARE_PROVIDER_SITE_OTHER): Payer: Medicare Other | Admitting: Thoracic Surgery (Cardiothoracic Vascular Surgery)

## 2021-01-16 ENCOUNTER — Other Ambulatory Visit: Payer: Self-pay

## 2021-01-16 VITALS — BP 174/78 | HR 63 | Resp 20 | Ht 71.0 in | Wt 180.0 lb

## 2021-01-16 DIAGNOSIS — R918 Other nonspecific abnormal finding of lung field: Secondary | ICD-10-CM

## 2021-01-16 NOTE — Progress Notes (Signed)
PCP is Perrin Maltese, MD Referring Provider is Dionisio David, MD  Chief Complaint  Patient presents with   Lung Mass    Surgical consult, PET Scan and PFT's 01/07/21    HPI: Mr. Erik Collins is sent for consultation regarding a right upper lobe lung nodule.  Jesson Foskey is a 75 year old man with a past medical history significant for tobacco abuse, COPD, lung cancer, CAD, MI, CABG, carotid occlusion, recent TIA, hypertension, hyperlipidemia, type 2 diabetes complicated by neuropathy, and sleep apnea.    He had a combined CABG and left lower lobectomy by Dr. Vanetta Mulders at [facility] in 2011.  Recently he was seen in the ED at [facility].  He presented with left arm weakness and numbness.  He has a known right carotid artery occlusion and said previous AION of the eye.  He had resolution of his symptoms and was discharged.  During his evaluation he was found to have a right upper lobe lung nodule.  He subsequently had a PET/CT which showed a 2.0 x 1.3 cm hypermetabolic right upper lobe lung nodule without any evidence of metastatic disease.  Dr. Chancy Milroy also did a stress test which showed a moderate sized fixed inferior infarct with an EF of 55%.   He denies any chest pain or pressure.  He does have some shortness of breath with exertion.  He does have a chronic productive cough which is been worse over the past 2 months.  He is also had more wheezing.  He does complain of frequent heartburn.  Activities are limited by pain in his right knee.  No further TIA symptoms after the episode in early October.  Zubrod Score: At the time of surgery this patient's most appropriate activity status/level should be described as: []     0    Normal activity, no symptoms [x]     1    Restricted in physical strenuous activity but ambulatory, able to do out light work []     2    Ambulatory and capable of self care, unable to do work activities, up and about >50 % of waking hours                              []     3    Only  limited self care, in bed greater than 50% of waking hours []     4    Completely disabled, no self care, confined to bed or chair []     5    Moribund   Past Medical History:  Diagnosis Date   Arthritis    osteoarthritis   Cancer ([place])    hx lung cancer   CHF (congestive heart failure) ([place])    Complication of anesthesia    knee surgery caused arrythmia requiring a temporary pacemaker   COPD (chronic obstructive pulmonary disease) ([place])    Coronary artery disease    s/p CABG   Diabetes mellitus without complication ([place])    Diverticulosis    Duodenitis    Dysrhythmia    during total knee replacement patient required a temporary pacemaker   GERD (gastroesophageal reflux disease)    Hypercholesterolemia    Hyperplastic colon polyp    Hypertension    Internal hemorrhoids    Lung cancer ([place])    Lung cancer ([place])    Myocardial infarction ([place])    09/2009 f/up with Dr. Neoma Laming   Sleep apnea     Past  Surgical History:  Procedure Laterality Date   BACK SURGERY     CATARACT EXTRACTION W/PHACO Left 11/09/2019   Procedure: CATARACT EXTRACTION PHACO AND INTRAOCULAR LENS PLACEMENT (IOC) LEFT DIABETIC MALYUGIN 5.91 01:01.0 9.6%;  Surgeon: Leandrew Koyanagi, MD;  Location: [place];  Service: Ophthalmology;  Laterality: Left;   CATARACT EXTRACTION W/PHACO Right 12/07/2019   Procedure: CATARACT EXTRACTION PHACO AND INTRAOCULAR LENS PLACEMENT (IOC) RIGHT DIABETIC MALYUGIN 6.38 01:05.1 9.8%;  Surgeon: Leandrew Koyanagi, MD;  Location: [place];  Service: Ophthalmology;  Laterality: Right;   COLONOSCOPY N/A 11/23/2019   Procedure: COLONOSCOPY;  Surgeon: Toledo, [name], MD;  Location: [facility] ENDOSCOPY;  Service: Gastroenterology;  Laterality: N/A;   COLONOSCOPY WITH PROPOFOL N/A 11/09/2014   Procedure: COLONOSCOPY WITH PROPOFOL;  Surgeon: Josefine Class, MD;  Location: [facility] ENDOSCOPY;  Service: Endoscopy;  Laterality: N/A;   CORONARY ANGIOPLASTY      CORONARY ARTERY BYPASS GRAFT  09/2009   quadruple, done at [place]     ESOPHAGOGASTRODUODENOSCOPY (EGD) WITH PROPOFOL N/A 11/09/2014   Procedure: ESOPHAGOGASTRODUODENOSCOPY (EGD) WITH PROPOFOL;  Surgeon: Josefine Class, MD;  Location: [facility] ENDOSCOPY;  Service: Endoscopy;  Laterality: N/A;   left lower lobectomy  09/2009   at [place] MICRODISCECTOMY  12/17/2011   Procedure: LUMBAR LAMINECTOMY/DECOMPRESSION MICRODISCECTOMY 1 LEVEL;  Surgeon: Eustace Moore, MD;  Location: [place] NEURO ORS;  Service: Neurosurgery;  Laterality: Right;  Right Lumbar five-sacral one microdiscectomy   TEMPORARY PACEMAKER Right 07/10/2017   Procedure: TEMPORARY PACEMAKER;  Surgeon: Isaias Cowman, MD;  Location: [place] CV LAB;  Service: Cardiovascular;  Laterality: Right;   TOTAL KNEE ARTHROPLASTY Left 07/08/2017   Procedure: TOTAL KNEE ARTHROPLASTY;  Surgeon: Earnestine Leys, MD;  Location: [facility] ORS;  Service: Orthopedics;  Laterality: Left;    Family History  Problem Relation Age of Onset   Heart disease Mother    Diabetes Mother    Heart disease Father    Stroke Father    Heart attack Father     Social History Social History   Tobacco Use   Smoking status: Former    Types: Cigarettes    Quit date: 09/19/2009    Years since quitting: 11.3   Smokeless tobacco: Never  Vaping Use   Vaping Use: Never used  Substance Use Topics   Alcohol use: No   Drug use: No    Current Outpatient Medications  Medication Sig Dispense Refill   acetaminophen (TYLENOL) 650 MG CR tablet Take 1,300 mg by mouth 2 (two) times daily.     aspirin EC 81 MG tablet Take 81 mg by mouth daily.     Bempedoic Acid (NEXLETOL PO) Take by mouth.     carvedilol (COREG) 12.5 MG tablet      clonazePAM (KLONOPIN) 1 MG tablet Take 1 mg by mouth at bedtime. For sleep     empagliflozin (JARDIANCE) 10 MG TABS tablet Take 10 mg by mouth daily.     ezetimibe (ZETIA) 10 MG  tablet Take 10 mg by mouth daily.     gabapentin (NEURONTIN) 300 MG capsule      Insulin Glargine 300 UNIT/ML SOPN Inject 0-40 Units into the skin See admin instructions. Sliding scale insulin per patient  3 units if blood sugar is greater than 150 during the day, if blood sugars are less= no insulin 40 units if blood sugar is greater than 150 at night, if blood sugars are less=no insulin  losartan (COZAAR) 50 MG tablet Take 50 mg by mouth daily.     meloxicam (MOBIC) 15 MG tablet      metFORMIN (GLUCOPHAGE) 1000 MG tablet Take 1,000 mg by mouth 2 (two) times daily.     Omega-3 Fatty Acids (FISH OIL) 1200 MG CAPS Take 1,200 mg by mouth 2 (two) times daily.     omeprazole (PRILOSEC) 20 MG capsule Take 20 mg by mouth daily.     vitamin B-12 (CYANOCOBALAMIN) 500 MCG tablet Take 500 mcg by mouth daily.     famotidine (PEPCID) 20 MG tablet Take 1 tablet (20 mg total) by mouth 2 (two) times daily. 60 tablet 1   lisinopril (PRINIVIL,ZESTRIL) 5 MG tablet Take 1 tablet (5 mg total) by mouth daily. 30 tablet 0   meclizine (ANTIVERT) 25 MG tablet Take 1 tablet (25 mg total) by mouth 3 (three) times daily as needed for dizziness. (Patient not taking: Reported on 01/16/2021) 15 tablet 0   oxyCODONE (OXY IR/ROXICODONE) 5 MG immediate release tablet Take 1-2 tablets (5-10 mg total) by mouth every 6 (six) hours as needed for moderate pain or severe pain. (Patient not taking: Reported on 01/16/2021) 30 tablet 0   spironolactone (ALDACTONE) 25 MG tablet Take 25 mg by mouth daily. (Patient not taking: Reported on 01/16/2021)     tiZANidine (ZANAFLEX) 2 MG tablet Take 4 mg by mouth at bedtime. (Patient not taking: Reported on 01/16/2021)     No current facility-administered medications for this visit.    No Known Allergies  Review of Systems  Constitutional:  Negative for appetite change, fatigue and unexpected weight change.  HENT:  Positive for dental problem (Dentures). Negative for trouble swallowing and  voice change.   Respiratory:  Positive for apnea, cough, shortness of breath and wheezing.   Cardiovascular:  Negative for chest pain and leg swelling.  Gastrointestinal:  Positive for abdominal pain (Reflux).  Genitourinary:  Negative for dysuria.  Musculoskeletal:  Positive for arthralgias, gait problem, joint swelling and myalgias.  Neurological:  Negative for seizures, syncope and weakness (See HPI).  Hematological:  Bruises/bleeds easily.  Psychiatric/Behavioral:  The patient is nervous/anxious.   All other systems reviewed and are negative.  BP (!) 174/78 (BP Location: Right Arm, Patient Position: Sitting)   Pulse 63   Resp 20   Ht 5\' 11"  (1.803 m)   Wt 180 lb (81.6 kg)   SpO2 93% Comment: RA  BMI 25.10 kg/m  Physical Exam Vitals reviewed.  Constitutional:      General: He is not in acute distress.    Appearance: Normal appearance.  HENT:     Head: Normocephalic and atraumatic.  Eyes:     General: No scleral icterus.    Extraocular Movements: Extraocular movements intact.  Neck:     Vascular: No carotid bruit.  Cardiovascular:     Rate and Rhythm: Normal rate and regular rhythm.     Heart sounds: Murmur (2/6 holosystolic murmur at apex) heard.  Pulmonary:     Effort: Pulmonary effort is normal. No respiratory distress.     Breath sounds: No wheezing.     Comments: Diminished breath sounds left base Abdominal:     General: There is no distension.     Palpations: Abdomen is soft.  Skin:    General: Skin is warm and dry.  Neurological:     General: No focal deficit present.     Mental Status: He is alert and oriented to person, place, and time.  Cranial Nerves: No cranial nerve deficit.     Motor: No weakness.     Diagnostic Tests: NUCLEAR MEDICINE PET SKULL BASE TO THIGH   TECHNIQUE: 9.34 mCi F-18 FDG was injected intravenously. Full-ring PET imaging was performed from the skull base to thigh after the radiotracer. CT data was obtained and used for  attenuation correction and anatomic localization.   Fasting blood glucose: 123 mg/dl   COMPARISON:  Previous chest x-ray from October of 2022.   FINDINGS: Mediastinal blood pool activity: SUV max 2.63   Liver activity: SUV max NA   NECK: No hypermetabolic lymph nodes in the neck.   Incidental CT findings: Signs of bilateral maxillary sinus disease near complete opacification of the maxillary sinuses similar to recent CT of the head.   CHEST: RIGHT upper lobe pulmonary nodule (image 64/4) approximately 2.0 x 1.3 cm with a maximum SUV of 10.4. No signs of metastatic disease in the chest. Post previous partial lung resection in the LEFT chest, LEFT lower lobectomy. In the LEFT lower chest there are grouped indistinct nodules (image 40/8) these areas are without substantial FDG uptake maximum SUV of 1.19, largest discrete area measuring approximately 3-4 mm.   Incidental CT findings: Signs of pulmonary emphysema. Postoperative changes of sternotomy for CABG.   ABDOMEN/PELVIS: No abnormal hypermetabolic activity within the liver, pancreas, adrenal glands, or spleen. No hypermetabolic lymph nodes in the abdomen or pelvis.   Incidental CT findings: Colonic diverticulosis. Signs of aortic atherosclerosis with mild aneurysmal caliber of the infrarenal abdominal aorta measuring 3 x 2.8 cm. Mild dilation of RIGHT common iliac artery to 1.7 cm.   SKELETON: No focal hypermetabolic activity to suggest skeletal metastasis.   Incidental CT findings: none   IMPRESSION: RIGHT upper lobe nodule with marked increased metabolic activity compatible malignancy likely bronchogenic neoplasm.   No additional signs of disease in the neck, chest, abdomen or pelvis.   Grouped nodules at the LEFT lung base favored to be infectious or inflammatory. Suggest attention on follow-up.   Status post LEFT lower lobectomy and coronary artery bypass grafting.   3 cm infrarenal abdominal aortic  aneurysm. Recommend follow-up every 3 years.   Reference: J Am Coll Radiol 8527;78:242-353.   Sinus disease as above and on recent CT of the head.   Aortic Atherosclerosis (ICD10-I70.0) and Emphysema (ICD10-J43.9).     Electronically Signed   By: Zetta Bills M.D.   On: 01/07/2021 16:50  I personally reviewed the PET/CT images.  There is a 2 x 1.3 cm right upper lobe lung nodule that is markedly hypermetabolic.  No hilar or mediastinal adenopathy and no evidence of distant metastasis.   Pulmonary function testing FVC 2.65 (67%) FEV1 1.54 (50%) FEV1 1.58 postbronchodilator DLCO 13.72 (55%)  Impression: Erik Collins is a 75 year old man with multiple medical issues including tobacco abuse, COPD, lung cancer, left lower lobectomy, CAD, MI, CABG, moderate mitral regurgitation, right ICA occlusion, recent TIA, hypertension, hyperlipidemia, sleep apnea, type 2 diabetes with peripheral neuropathy.  He recently presented to [facility] ED with left arm weakness.  This sounds fully consistent with a TIA.  He had recovery of function.  A CT angiogram showed a right internal carotid occlusion.  There also was a right upper lobe lung nodule.  PET/CT showed the 2 cm right upper lobe lung nodule was hypermetabolic.  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  He had a combined CABG left lower lobectomy by Dr. Rosana Hoes at [facility] back in 2011.  That was a stage Ia adenocarcinoma.  I had a long discussion with Mr. Crum and his family.  He appears to have some short-term memory issues but his daughter is fully aware of all of his medical issues.  We discussed potential treatment options including surgical resection and stereotactic radiation.  Given the size and location of the tumor I think he would require lobectomy I do not think wedge would be an option.  Surgery would require a right upper lobectomy in the setting of a previous left lower lobectomy.  His FEV1 and DLCO are both  about 50% of predicted.  He does have just adequate pulmonary reserve to tolerate resection but there is a good chance he might end up on oxygen.  He is very strongly against any treatment that might require him to use oxygen.  He would be a high risk surgical candidate given his cardiac and cerebrovascular issues in addition to his previous contralateral lobectomy and pulmonary issues.  I do not see any specific risk that is completely prohibitive but overall he would fall into a high risk category.  After discussion he wishes to consider primary radiation for the tumor.  I am going to refer him to radiation oncology at [facility].  He also would benefit from seeing a medical oncologist just to get a complete multidisciplinary approach.  Should he change his mind and decide he wants surgery we could revisit the topic.  Plan: Referral to medical and radiation oncology at [facility]  I spent over 45 minutes in review of records, images, and in consultation with Mr. Siedschlag today.  Melrose Nakayama, MD [facility] [phone]

## 2021-01-17 ENCOUNTER — Telehealth: Payer: Self-pay

## 2021-01-17 NOTE — Telephone Encounter (Signed)
Called to inform patient of consult apt time and date. Pt did not answer LMTCB. Mailing the apt information.

## 2021-01-20 NOTE — Progress Notes (Signed)
MRN : [id number]  Erik Collins is a 75 y.o. (1945-06-10) male who presents with chief complaint of check carotid arteries.  History of Present Illness:   The patient is seen for evaluation of carotid stenosis. The right carotid occlusion was identified remotely.  It was reidentified recently on a CT that was obtained for further evaluation of a right upper lobe lung cancer.  Study date is December 03, 2020.  It once again demonstrates the occlusion of the right internal carotid artery.  Left internal carotid artery is widely patent with mild atherosclerotic changes.  The patient denies amaurosis fugax. There is no recent history of TIA symptoms or focal motor deficits. There is no prior documented CVA.  There is no history of migraine headaches. There is no history of seizures.  The patient is taking enteric-coated aspirin 81 mg daily.  With respect to the patient's lung cancer he is undergoing radiation therapy as his primary mode of treatment.    The patient has a history of coronary artery disease, no recent episodes of angina or shortness of breath. The patient denies PAD or claudication symptoms. There is a history of hyperlipidemia which is being treated with a statin.    No outpatient medications have been marked as taking for the 01/21/21 encounter (Appointment) with Delana Meyer, Dolores Lory, MD.    Past Medical History:  Diagnosis Date   Arthritis    osteoarthritis   Cancer ([place])    hx lung cancer   CHF (congestive heart failure) ([place])    Complication of anesthesia    knee surgery caused arrythmia requiring a temporary pacemaker   COPD (chronic obstructive pulmonary disease) ([place])    Coronary artery disease    s/p CABG   Diabetes mellitus without complication ([place])    Diverticulosis    Duodenitis    Dysrhythmia    during total knee replacement patient required a temporary pacemaker   GERD (gastroesophageal reflux disease)    Hypercholesterolemia    Hyperplastic colon  polyp    Hypertension    Internal hemorrhoids    Lung cancer ([place])    Lung cancer ([place])    Myocardial infarction ([place])    09/2009 f/up with Dr. Neoma Laming   Sleep apnea     Past Surgical History:  Procedure Laterality Date   BACK SURGERY     CATARACT EXTRACTION W/PHACO Left 11/09/2019   Procedure: CATARACT EXTRACTION PHACO AND INTRAOCULAR LENS PLACEMENT (IOC) LEFT DIABETIC MALYUGIN 5.91 01:01.0 9.6%;  Surgeon: Leandrew Koyanagi, MD;  Location: [place];  Service: Ophthalmology;  Laterality: Left;   CATARACT EXTRACTION W/PHACO Right 12/07/2019   Procedure: CATARACT EXTRACTION PHACO AND INTRAOCULAR LENS PLACEMENT (IOC) RIGHT DIABETIC MALYUGIN 6.38 01:05.1 9.8%;  Surgeon: Leandrew Koyanagi, MD;  Location: [place];  Service: Ophthalmology;  Laterality: Right;   COLONOSCOPY N/A 11/23/2019   Procedure: COLONOSCOPY;  Surgeon: Toledo, [name], MD;  Location: [facility] ENDOSCOPY;  Service: Gastroenterology;  Laterality: N/A;   COLONOSCOPY WITH PROPOFOL N/A 11/09/2014   Procedure: COLONOSCOPY WITH PROPOFOL;  Surgeon: Josefine Class, MD;  Location: [facility] ENDOSCOPY;  Service: Endoscopy;  Laterality: N/A;   CORONARY ANGIOPLASTY     CORONARY ARTERY BYPASS GRAFT  09/2009   quadruple, done at [place]     ESOPHAGOGASTRODUODENOSCOPY (EGD) WITH PROPOFOL N/A 11/09/2014   Procedure: ESOPHAGOGASTRODUODENOSCOPY (EGD) WITH PROPOFOL;  Surgeon: Josefine Class, MD;  Location: [facility] ENDOSCOPY;  Service: Endoscopy;  Laterality: N/A;   left lower  lobectomy  09/2009   at [place] MICRODISCECTOMY  12/17/2011   Procedure: LUMBAR LAMINECTOMY/DECOMPRESSION MICRODISCECTOMY 1 LEVEL;  Surgeon: Eustace Moore, MD;  Location: [place] NEURO ORS;  Service: Neurosurgery;  Laterality: Right;  Right Lumbar five-sacral one microdiscectomy   TEMPORARY PACEMAKER Right 07/10/2017   Procedure: TEMPORARY PACEMAKER;  Surgeon: Isaias Cowman, MD;   Location: [place] CV LAB;  Service: Cardiovascular;  Laterality: Right;   TOTAL KNEE ARTHROPLASTY Left 07/08/2017   Procedure: TOTAL KNEE ARTHROPLASTY;  Surgeon: Earnestine Leys, MD;  Location: [facility] ORS;  Service: Orthopedics;  Laterality: Left;    Social History Social History   Tobacco Use   Smoking status: Former    Types: Cigarettes    Quit date: 09/19/2009    Years since quitting: 11.3   Smokeless tobacco: Never  Vaping Use   Vaping Use: Never used  Substance Use Topics   Alcohol use: No   Drug use: No    Family History Family History  Problem Relation Age of Onset   Heart disease Mother    Diabetes Mother    Heart disease Father    Stroke Father    Heart attack Father     No Known Allergies   REVIEW OF SYSTEMS (Negative unless checked)  Constitutional: [] Weight loss  [] Fever  [] Chills Cardiac: [] Chest pain   [] Chest pressure   [] Palpitations   [] Shortness of breath when laying flat   [] Shortness of breath with exertion. Vascular:  [] Pain in legs with walking   [] Pain in legs at rest  [] History of DVT   [] Phlebitis   [] Swelling in legs   [] Varicose veins   [] Non-healing ulcers Pulmonary:   [] Uses home oxygen   [] Productive cough   [] Hemoptysis   [] Wheeze  [] COPD   [] Asthma Neurologic:  [] Dizziness   [] Seizures   [] History of stroke   [] History of TIA  [] Aphasia   [] Vissual changes   [] Weakness or numbness in arm   [] Weakness or numbness in leg Musculoskeletal:   [] Joint swelling   [] Joint pain   [] Low back pain Hematologic:  [] Easy bruising  [] Easy bleeding   [] Hypercoagulable state   [] Anemic Gastrointestinal:  [] Diarrhea   [] Vomiting  [] Gastroesophageal reflux/heartburn   [] Difficulty swallowing. Genitourinary:  [] Chronic kidney disease   [] Difficult urination  [] Frequent urination   [] Blood in urine Skin:  [] Rashes   [] Ulcers  Psychological:  [] History of anxiety   []  History of major depression.  Physical Examination  There were no vitals filed for this  visit. There is no height or weight on file to calculate BMI. Gen: WD/WN, NAD Head: [place]/AT, No temporalis wasting.  Ear/Nose/Throat: Hearing grossly intact, nares w/o erythema or drainage Eyes: PER, EOMI, sclera nonicteric.  Neck: Supple, no masses.  No bruit or JVD.  Pulmonary:  Good air movement, no audible wheezing, no use of accessory muscles.  Cardiac: RRR, normal S1, S2, no Murmurs. Vascular:   No carotid bruits auscultated Vessel Right Left  Radial Palpable Palpable  Carotid Palpable Palpable  Gastrointestinal: soft, non-distended. No guarding/no peritoneal signs.  Musculoskeletal: M/S 5/5 throughout.  No visible deformity.  Neurologic: CN 2-12 intact. Pain and light touch intact in extremities.  Symmetrical.  Speech is fluent. Motor exam as listed above. Psychiatric: Judgment intact, Mood & affect appropriate for pt's clinical situation. Dermatologic: No rashes or ulcers noted.  No changes consistent with cellulitis.   CBC Lab Results  Component Value Date   WBC 10.9 (H) 11/17/2020   HGB 12.6 (L)  11/17/2020   HCT 37.9 (L) 11/17/2020   MCV 92.4 11/17/2020   PLT 264 11/17/2020    BMET    Component Value Date/Time   NA 138 11/17/2020 1504   NA 137 01/03/2010 1019   K 5.1 11/17/2020 1504   K 4.7 01/03/2010 1019   CL 106 11/17/2020 1504   CL 103 01/03/2010 1019   CO2 22 11/17/2020 1504   CO2 27 01/03/2010 1019   GLUCOSE 100 (H) 11/17/2020 1504   GLUCOSE 263 (H) 01/03/2010 1019   BUN 46 (H) 11/17/2020 1504   BUN 18 01/03/2010 1019   CREATININE 1.59 (H) 11/17/2020 1504   CREATININE 0.9 01/03/2010 1019   CALCIUM 9.2 11/17/2020 1504   CALCIUM 8.9 01/03/2010 1019   GFRNONAA 45 (L) 11/17/2020 1504   GFRAA >60 12/31/2018 0806   CrCl cannot be calculated (Patient's most recent lab result is older than the maximum 21 days allowed.).  COAG Lab Results  Component Value Date   INR 1.1 12/31/2018   INR 0.92 06/25/2017   INR 0.97 12/16/2011    Radiology NM PET Image  Initial (PI) Skull Base To Thigh (F-18 FDG)  Result Date: 01/07/2021 CLINICAL DATA:  Initial treatment strategy for lung mass in a 75 year old male. EXAM: NUCLEAR MEDICINE PET SKULL BASE TO THIGH TECHNIQUE: 9.34 mCi F-18 FDG was injected intravenously. Full-ring PET imaging was performed from the skull base to thigh after the radiotracer. CT data was obtained and used for attenuation correction and anatomic localization. Fasting blood glucose: 123 mg/dl COMPARISON:  Previous chest x-ray from October of 2022. FINDINGS: Mediastinal blood pool activity: SUV max 2.63 Liver activity: SUV max NA NECK: No hypermetabolic lymph nodes in the neck. Incidental CT findings: Signs of bilateral maxillary sinus disease near complete opacification of the maxillary sinuses similar to recent CT of the head. CHEST: RIGHT upper lobe pulmonary nodule (image 64/4) approximately 2.0 x 1.3 cm with a maximum SUV of 10.4. No signs of metastatic disease in the chest. Post previous partial lung resection in the LEFT chest, LEFT lower lobectomy. In the LEFT lower chest there are grouped indistinct nodules (image 40/8) these areas are without substantial FDG uptake maximum SUV of 1.19, largest discrete area measuring approximately 3-4 mm. Incidental CT findings: Signs of pulmonary emphysema. Postoperative changes of sternotomy for CABG. ABDOMEN/PELVIS: No abnormal hypermetabolic activity within the liver, pancreas, adrenal glands, or spleen. No hypermetabolic lymph nodes in the abdomen or pelvis. Incidental CT findings: Colonic diverticulosis. Signs of aortic atherosclerosis with mild aneurysmal caliber of the infrarenal abdominal aorta measuring 3 x 2.8 cm. Mild dilation of RIGHT common iliac artery to 1.7 cm. SKELETON: No focal hypermetabolic activity to suggest skeletal metastasis. Incidental CT findings: none IMPRESSION: RIGHT upper lobe nodule with marked increased metabolic activity compatible malignancy likely bronchogenic neoplasm. No  additional signs of disease in the neck, chest, abdomen or pelvis. Grouped nodules at the LEFT lung base favored to be infectious or inflammatory. Suggest attention on follow-up. Status post LEFT lower lobectomy and coronary artery bypass grafting. 3 cm infrarenal abdominal aortic aneurysm. Recommend follow-up every 3 years. Reference: J Am Coll Radiol 0086;76:195-093. Sinus disease as above and on recent CT of the head. Aortic Atherosclerosis (ICD10-I70.0) and Emphysema (ICD10-J43.9). Electronically Signed   By: Zetta Bills M.D.   On: 01/07/2021 16:50     Assessment/Plan 1. Bilateral carotid artery stenosis Recommend:  Given the patient's asymptomatic subcritical stenosis no further invasive testing or surgery at this time.  Duplex ultrasound shows known  occlusion of the right internal carotid artery with less than 30% stenosis of the left internal carotid artery.  Continue antiplatelet therapy as prescribed Continue management of CAD, HTN and Hyperlipidemia Healthy heart diet,  encouraged exercise at least 4 times per week Follow up in 12 months with duplex ultrasound and physical exam.  I explained that continued follow-up is important given that radiation treatment for his cancer may actually increase the speed at which a stenosis develops.  Given this we will institute a surveillance plan at 12 months and intervals and adjust accordingly as needed.  2. Essential hypertension Continue antihypertensive medications as already ordered, these medications have been reviewed and there are no changes at this time.   3. Coronary artery disease involving native coronary artery of native heart with angina pectoris ([place]) Continue cardiac and antihypertensive medications as already ordered and reviewed, no changes at this time.  Continue statin as ordered and reviewed, no changes at this time  Nitrates PRN for chest pain   4. Chronic obstructive pulmonary disease, unspecified COPD type  ([place]) Continue pulmonary medications and aerosols as already ordered, these medications have been reviewed and there are no changes at this time.    5. Type 2 diabetes mellitus without complication, unspecified whether long term insulin use ([place]) Continue hypoglycemic medications as already ordered, these medications have been reviewed and there are no changes at this time.  Hgb A1C to be monitored as already arranged by primary service     Hortencia Pilar, MD  01/20/2021 3:39 PM

## 2021-01-21 ENCOUNTER — Encounter (INDEPENDENT_AMBULATORY_CARE_PROVIDER_SITE_OTHER): Payer: Self-pay | Admitting: Vascular Surgery

## 2021-01-21 ENCOUNTER — Other Ambulatory Visit: Payer: Self-pay

## 2021-01-21 ENCOUNTER — Ambulatory Visit (INDEPENDENT_AMBULATORY_CARE_PROVIDER_SITE_OTHER): Payer: Medicare Other | Admitting: Vascular Surgery

## 2021-01-21 VITALS — BP 155/75 | HR 55 | Ht 71.0 in | Wt 186.0 lb

## 2021-01-21 DIAGNOSIS — I10 Essential (primary) hypertension: Secondary | ICD-10-CM

## 2021-01-21 DIAGNOSIS — I25119 Atherosclerotic heart disease of native coronary artery with unspecified angina pectoris: Secondary | ICD-10-CM

## 2021-01-21 DIAGNOSIS — J449 Chronic obstructive pulmonary disease, unspecified: Secondary | ICD-10-CM | POA: Diagnosis not present

## 2021-01-21 DIAGNOSIS — I6523 Occlusion and stenosis of bilateral carotid arteries: Secondary | ICD-10-CM | POA: Diagnosis not present

## 2021-01-21 DIAGNOSIS — E119 Type 2 diabetes mellitus without complications: Secondary | ICD-10-CM

## 2021-01-23 DIAGNOSIS — C3411 Malignant neoplasm of upper lobe, right bronchus or lung: Secondary | ICD-10-CM | POA: Diagnosis not present

## 2021-01-23 DIAGNOSIS — Z87891 Personal history of nicotine dependence: Secondary | ICD-10-CM | POA: Diagnosis not present

## 2021-01-24 ENCOUNTER — Other Ambulatory Visit: Payer: Self-pay

## 2021-01-24 ENCOUNTER — Encounter: Payer: Self-pay | Admitting: Radiation Oncology

## 2021-01-24 ENCOUNTER — Ambulatory Visit
Admission: RE | Admit: 2021-01-24 | Discharge: 2021-01-24 | Disposition: A | Discharge status: Home / Self Care | Payer: Medicare Other | Source: Ambulatory Visit | Attending: Radiation Oncology | Admitting: Radiation Oncology

## 2021-01-24 VITALS — BP 144/69 | HR 61 | Temp 95.2°F | Resp 18 | Wt 187.5 lb

## 2021-01-24 DIAGNOSIS — E119 Type 2 diabetes mellitus without complications: Secondary | ICD-10-CM | POA: Diagnosis not present

## 2021-01-24 DIAGNOSIS — R918 Other nonspecific abnormal finding of lung field: Secondary | ICD-10-CM | POA: Diagnosis not present

## 2021-01-24 DIAGNOSIS — I509 Heart failure, unspecified: Secondary | ICD-10-CM | POA: Insufficient documentation

## 2021-01-24 DIAGNOSIS — E7800 Pure hypercholesterolemia, unspecified: Secondary | ICD-10-CM | POA: Insufficient documentation

## 2021-01-24 DIAGNOSIS — Z7984 Long term (current) use of oral hypoglycemic drugs: Secondary | ICD-10-CM | POA: Insufficient documentation

## 2021-01-24 DIAGNOSIS — Z794 Long term (current) use of insulin: Secondary | ICD-10-CM | POA: Insufficient documentation

## 2021-01-24 DIAGNOSIS — Z7982 Long term (current) use of aspirin: Secondary | ICD-10-CM | POA: Insufficient documentation

## 2021-01-24 DIAGNOSIS — I2510 Atherosclerotic heart disease of native coronary artery without angina pectoris: Secondary | ICD-10-CM | POA: Diagnosis not present

## 2021-01-24 DIAGNOSIS — I252 Old myocardial infarction: Secondary | ICD-10-CM | POA: Insufficient documentation

## 2021-01-24 DIAGNOSIS — Z79899 Other long term (current) drug therapy: Secondary | ICD-10-CM | POA: Insufficient documentation

## 2021-01-24 DIAGNOSIS — Z87891 Personal history of nicotine dependence: Secondary | ICD-10-CM | POA: Insufficient documentation

## 2021-01-24 DIAGNOSIS — Z8673 Personal history of transient ischemic attack (TIA), and cerebral infarction without residual deficits: Secondary | ICD-10-CM | POA: Diagnosis not present

## 2021-01-24 DIAGNOSIS — I10 Essential (primary) hypertension: Secondary | ICD-10-CM | POA: Insufficient documentation

## 2021-01-24 DIAGNOSIS — J439 Emphysema, unspecified: Secondary | ICD-10-CM | POA: Diagnosis not present

## 2021-01-24 DIAGNOSIS — R911 Solitary pulmonary nodule: Secondary | ICD-10-CM

## 2021-01-24 DIAGNOSIS — K219 Gastro-esophageal reflux disease without esophagitis: Secondary | ICD-10-CM | POA: Diagnosis not present

## 2021-01-24 NOTE — Consult Note (Signed)
NEW PATIENT EVALUATION  Name: Erik Collins  MRN: [id number]  Date:   01/24/2021     DOB: 1945/11/01   This 75 y.o. 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   PREVIOUS INVESTIGATIONS:  CT scans PET CT scans and MRI of brain reviewed Clinical notes reviewed Labs reviewed  HPI: Patient is a 75 year old male past medical history significant for left lower lobectomy as well as CABG at [facility] in 2011.  He has known COPD emphysema.  Recently evaluated for TIAs.  MRI of his brain demonstrated chronic right frontal lobe infarcts with additional chronic lacunar infarct of the right pons.  He also had multiple scattered punctate chronic microhemorrhages involving both cerebral hemispheres.  Recent visit to the ED at [facility] showed left sided weakness with resolution of his symptoms.  During evaluation he was found to have a right upper lobe lung nodule PET CT scan showed a 2 x 1.3 cm hypermetabolic right upper lobe lung nodule without any evidence of mediastinal or distant metastatic disease.  He has been recently evaluated by cardiothoracic surgery and based on the size and nature of the lesion plus his overall medical condition not thought to be a surgical candidate.  He would require a lobectomy.  FEV1 and DLCO are both in 50% predicted range.  He is now referred to radiation oncology for consideration of treatment.  He is doing fairly well.  No cough hemoptysis or chest tightness at this time.  PLANNED TREATMENT REGIMEN: SBRT  PAST MEDICAL HISTORY:  has a past medical history of Arthritis, Cancer ([place]), CHF (congestive heart failure) ([place]), Complication  of anesthesia, COPD (chronic obstructive pulmonary disease) ([place]), Coronary artery disease, Diabetes mellitus without complication ([place]), Diverticulosis, Duodenitis, Dysrhythmia, GERD (gastroesophageal reflux disease), Hypercholesterolemia, Hyperplastic colon polyp, Hypertension, Internal hemorrhoids, Lung cancer ([place]), Lung cancer ([place]), Myocardial infarction ([place]), and Sleep apnea.    PAST SURGICAL HISTORY:  Past Surgical History:  Procedure Laterality Date   BACK SURGERY     CATARACT EXTRACTION W/PHACO Left 11/09/2019   Procedure: CATARACT EXTRACTION PHACO AND INTRAOCULAR LENS PLACEMENT (IOC) LEFT DIABETIC MALYUGIN 5.91 01:01.0 9.6%;  Surgeon: Leandrew Koyanagi, MD;  Location: [place];  Service: Ophthalmology;  Laterality: Left;   CATARACT EXTRACTION W/PHACO Right 12/07/2019   Procedure: CATARACT EXTRACTION PHACO AND INTRAOCULAR LENS PLACEMENT (IOC) RIGHT DIABETIC MALYUGIN 6.38 01:05.1 9.8%;  Surgeon: Leandrew Koyanagi, MD;  Location: [place];  Service: Ophthalmology;  Laterality: Right;   COLONOSCOPY N/A 11/23/2019   Procedure: COLONOSCOPY;  Surgeon: Toledo, [name], MD;  Location: [facility] ENDOSCOPY;  Service: Gastroenterology;  Laterality: N/A;   COLONOSCOPY WITH PROPOFOL N/A 11/09/2014   Procedure: COLONOSCOPY WITH PROPOFOL;  Surgeon: Josefine Class, MD;  Location: [facility] ENDOSCOPY;  Service: Endoscopy;  Laterality: N/A;   CORONARY ANGIOPLASTY     CORONARY ARTERY BYPASS GRAFT  09/2009   quadruple, done at [place]     ESOPHAGOGASTRODUODENOSCOPY (EGD) WITH PROPOFOL N/A 11/09/2014   Procedure: ESOPHAGOGASTRODUODENOSCOPY (EGD) WITH PROPOFOL;  Surgeon: Josefine Class, MD;  Location: [facility] ENDOSCOPY;  Service: Endoscopy;  Laterality: N/A;   left lower lobectomy  09/2009   at [place] MICRODISCECTOMY  12/17/2011   Procedure: LUMBAR LAMINECTOMY/DECOMPRESSION MICRODISCECTOMY 1 LEVEL;  Surgeon: Eustace Moore,  MD;  Location: [place] NEURO ORS;  Service: Neurosurgery;  Laterality: Right;  Right Lumbar five-sacral one microdiscectomy   TEMPORARY PACEMAKER Right 07/10/2017   Procedure: TEMPORARY PACEMAKER;  Surgeon: Isaias Cowman, MD;  Location: [place] CV LAB;  Service: Cardiovascular;  Laterality: Right;   TOTAL KNEE ARTHROPLASTY Left 07/08/2017   Procedure: TOTAL KNEE ARTHROPLASTY;  Surgeon: Earnestine Leys, MD;  Location: [facility] ORS;  Service: Orthopedics;  Laterality: Left;    FAMILY HISTORY: family history includes Diabetes in his mother; Heart attack in his father; Heart disease in his father and mother; Stroke in his father.  SOCIAL HISTORY:  reports that he quit smoking about 11 years ago. He has never used smokeless tobacco. He reports that he does not drink alcohol and does not use drugs.  ALLERGIES: Patient has no known allergies.  MEDICATIONS:  Current Outpatient Medications  Medication Sig Dispense Refill   acetaminophen (TYLENOL) 650 MG CR tablet Take 1,300 mg by mouth 2 (two) times daily.     aspirin EC 81 MG tablet Take 81 mg by mouth daily.     Bempedoic Acid (NEXLETOL PO) Take by mouth.     carvedilol (COREG) 12.5 MG tablet      empagliflozin (JARDIANCE) 10 MG TABS tablet Take 10 mg by mouth daily.     ezetimibe (ZETIA) 10 MG tablet Take 10 mg by mouth daily.     gabapentin (NEURONTIN) 300 MG capsule      Insulin Glargine 300 UNIT/ML SOPN Inject 0-40 Units into the skin See admin instructions. Sliding scale insulin per patient  3 units if blood sugar is greater than 150 during the day, if blood sugars are less= no insulin 40 units if blood sugar is greater than 150 at night, if blood sugars are less=no insulin     losartan (COZAAR) 50 MG tablet Take 50 mg by mouth daily.     meloxicam (MOBIC) 15 MG tablet      metFORMIN (GLUCOPHAGE) 1000 MG tablet Take 1,000 mg by mouth 2 (two) times daily.     Omega-3 Fatty Acids (FISH OIL) 1200 MG CAPS Take 1,200 mg by mouth 2 (two) times  daily.     omeprazole (PRILOSEC) 20 MG capsule Take 20 mg by mouth daily.     vitamin B-12 (CYANOCOBALAMIN) 500 MCG tablet Take 500 mcg by mouth daily.     famotidine (PEPCID) 20 MG tablet Take 1 tablet (20 mg total) by mouth 2 (two) times daily. 60 tablet 1   meclizine (ANTIVERT) 25 MG tablet Take 1 tablet (25 mg total) by mouth 3 (three) times daily as needed for dizziness. (Patient not taking: Reported on 01/24/2021) 15 tablet 0   No current facility-administered medications for this encounter.    ECOG PERFORMANCE STATUS:  0 - Asymptomatic  REVIEW OF SYSTEMS: Patient does have known COPD emphysema CAD MI CABG moderate mitral regurg right ICA occlusion history of TIA hypertension hyperlipidemia sleep apnea type 2 diabetes and peripheral neuropathy Patient denies any weight loss, fatigue, weakness, fever, chills or night sweats. Patient denies any loss of vision, blurred vision. Patient denies any ringing  of the ears or hearing loss. No irregular heartbeat. Patient denies heart murmur or history of fainting. Patient denies any chest pain or pain radiating to her upper extremities. Patient denies any shortness of breath, difficulty breathing at night, cough  or hemoptysis. Patient denies any swelling in the lower legs. Patient denies any nausea vomiting, vomiting of blood, or coffee ground material in the vomitus. Patient denies any stomach pain. Patient states has had normal bowel movements no significant constipation or diarrhea. Patient denies any dysuria, hematuria or significant nocturia. Patient denies any problems walking, swelling in the joints or loss of balance. Patient denies any skin changes, loss of hair or loss of weight. Patient denies any excessive worrying or anxiety or significant depression. Patient denies any problems with insomnia. Patient denies excessive thirst, polyuria, polydipsia. Patient denies any swollen glands, patient denies easy bruising or easy bleeding. Patient denies any  recent infections, allergies or URI. Patient "s visual fields have not changed significantly in recent time.   PHYSICAL EXAM: BP (!) 144/69 (BP Location: Right Arm, Patient Position: Sitting)   Pulse 61   Temp (!) 95.2 F (35.1 C) (Tympanic)   Resp 18   Wt 187 lb 8 oz (85 kg)   BMI 26.15 kg/m  Well-developed well-nourished patient in NAD. HEENT reveals PERLA, EOMI, discs not visualized.  Oral cavity is clear. No oral mucosal lesions are identified. Neck is clear without evidence of cervical or supraclavicular adenopathy. Lungs are clear to A&P. Cardiac examination is essentially unremarkable with regular rate and rhythm without murmur rub or thrill. Abdomen is benign with no organomegaly or masses noted. Motor sensory and DTR levels are equal and symmetric in the upper and lower extremities. Cranial nerves II through XII are grossly intact. Proprioception is intact. No peripheral adenopathy or edema is identified. No motor or sensory levels are noted. Crude visual fields are within normal range.  LABORATORY DATA: Labs reviewed    RADIOLOGY RESULTS: CT scans and PET CT scans reviewed compatible with above-stated findings   IMPRESSION: Stage I non-small cell lung cancer of the right upper lobe in 75 year old male with multiple medical comorbidities excluding surgical excision.  PLAN: At this time I have recommended SBRT to his right upper lobe lesion.  I will plan delivering 60 Gray in 5 fractions using motion restriction as well as 4-dimensional treatment planning.  Risks and benefits and the low side effect profile of SBRT were reviewed with the patient and his family.  May develop a slight cough about a month out slight fatigue no other significant side effects.  I have personally set up and ordered CT simulation with motion restriction as well as 4-dimensional treatment planning.  Family and patient both comprehend my treatment plan well.  I would like to take this opportunity to thank you  for allowing me to participate in the care of your patient.Noreene Filbert, MD

## 2021-02-06 ENCOUNTER — Ambulatory Visit
Admission: RE | Admit: 2021-02-06 | Discharge: 2021-02-06 | Disposition: A | Discharge status: Home / Self Care | Payer: Medicare Other | Source: Ambulatory Visit | Attending: Radiation Oncology | Admitting: Radiation Oncology

## 2021-02-06 DIAGNOSIS — C3411 Malignant neoplasm of upper lobe, right bronchus or lung: Secondary | ICD-10-CM | POA: Insufficient documentation

## 2021-02-06 DIAGNOSIS — Z510 Encounter for antineoplastic radiation therapy: Secondary | ICD-10-CM | POA: Insufficient documentation

## 2021-02-14 DIAGNOSIS — Z510 Encounter for antineoplastic radiation therapy: Secondary | ICD-10-CM | POA: Diagnosis not present

## 2021-02-20 ENCOUNTER — Ambulatory Visit
Admission: RE | Admit: 2021-02-20 | Discharge: 2021-02-20 | Disposition: A | Discharge status: Home / Self Care | Payer: Medicare Other | Source: Ambulatory Visit | Attending: Radiation Oncology | Admitting: Radiation Oncology

## 2021-02-20 DIAGNOSIS — Z510 Encounter for antineoplastic radiation therapy: Secondary | ICD-10-CM | POA: Insufficient documentation

## 2021-02-20 DIAGNOSIS — C3411 Malignant neoplasm of upper lobe, right bronchus or lung: Secondary | ICD-10-CM | POA: Insufficient documentation

## 2021-02-22 ENCOUNTER — Ambulatory Visit
Admission: RE | Admit: 2021-02-22 | Discharge: 2021-02-22 | Disposition: A | Discharge status: Home / Self Care | Payer: Medicare Other | Source: Ambulatory Visit | Attending: Radiation Oncology | Admitting: Radiation Oncology

## 2021-02-22 DIAGNOSIS — Z510 Encounter for antineoplastic radiation therapy: Secondary | ICD-10-CM | POA: Diagnosis not present

## 2021-02-22 DIAGNOSIS — C3411 Malignant neoplasm of upper lobe, right bronchus or lung: Secondary | ICD-10-CM | POA: Diagnosis not present

## 2021-02-25 ENCOUNTER — Ambulatory Visit
Admission: RE | Admit: 2021-02-25 | Discharge: 2021-02-25 | Disposition: A | Discharge status: Home / Self Care | Payer: Medicare Other | Source: Ambulatory Visit | Attending: Radiation Oncology | Admitting: Radiation Oncology

## 2021-02-25 DIAGNOSIS — C3411 Malignant neoplasm of upper lobe, right bronchus or lung: Secondary | ICD-10-CM | POA: Diagnosis not present

## 2021-02-25 DIAGNOSIS — Z510 Encounter for antineoplastic radiation therapy: Secondary | ICD-10-CM | POA: Diagnosis not present

## 2021-02-27 ENCOUNTER — Ambulatory Visit
Admission: RE | Admit: 2021-02-27 | Discharge: 2021-02-27 | Disposition: A | Discharge status: Home / Self Care | Payer: Medicare Other | Source: Ambulatory Visit | Attending: Radiation Oncology | Admitting: Radiation Oncology

## 2021-02-27 DIAGNOSIS — C3411 Malignant neoplasm of upper lobe, right bronchus or lung: Secondary | ICD-10-CM | POA: Diagnosis not present

## 2021-02-27 DIAGNOSIS — Z510 Encounter for antineoplastic radiation therapy: Secondary | ICD-10-CM | POA: Diagnosis not present

## 2021-03-04 ENCOUNTER — Ambulatory Visit
Admission: RE | Admit: 2021-03-04 | Discharge: 2021-03-04 | Disposition: A | Discharge status: Home / Self Care | Payer: Medicare Other | Source: Ambulatory Visit | Attending: Radiation Oncology | Admitting: Radiation Oncology

## 2021-03-04 DIAGNOSIS — Z87891 Personal history of nicotine dependence: Secondary | ICD-10-CM | POA: Diagnosis not present

## 2021-03-04 DIAGNOSIS — Z510 Encounter for antineoplastic radiation therapy: Secondary | ICD-10-CM | POA: Diagnosis not present

## 2021-03-04 DIAGNOSIS — C3411 Malignant neoplasm of upper lobe, right bronchus or lung: Secondary | ICD-10-CM | POA: Diagnosis not present

## 2021-03-06 DIAGNOSIS — M25561 Pain in right knee: Secondary | ICD-10-CM | POA: Diagnosis not present

## 2021-03-06 DIAGNOSIS — M1711 Unilateral primary osteoarthritis, right knee: Secondary | ICD-10-CM | POA: Diagnosis not present

## 2021-03-13 DIAGNOSIS — M1711 Unilateral primary osteoarthritis, right knee: Secondary | ICD-10-CM | POA: Diagnosis not present

## 2021-03-13 DIAGNOSIS — M25561 Pain in right knee: Secondary | ICD-10-CM | POA: Diagnosis not present

## 2021-03-20 DIAGNOSIS — M1711 Unilateral primary osteoarthritis, right knee: Secondary | ICD-10-CM | POA: Diagnosis not present

## 2021-03-20 DIAGNOSIS — E119 Type 2 diabetes mellitus without complications: Secondary | ICD-10-CM | POA: Diagnosis not present

## 2021-04-05 ENCOUNTER — Ambulatory Visit
Admission: RE | Admit: 2021-04-05 | Discharge: 2021-04-05 | Disposition: A | Discharge status: Home / Self Care | Payer: Medicare Other | Source: Ambulatory Visit | Attending: Radiation Oncology | Admitting: Radiation Oncology

## 2021-04-05 ENCOUNTER — Other Ambulatory Visit: Payer: Self-pay | Admitting: *Deleted

## 2021-04-05 ENCOUNTER — Other Ambulatory Visit: Payer: Self-pay

## 2021-04-05 VITALS — BP 140/67 | HR 63 | Temp 96.7°F | Resp 18 | Ht 71.0 in | Wt 188.7 lb

## 2021-04-05 DIAGNOSIS — R911 Solitary pulmonary nodule: Secondary | ICD-10-CM

## 2021-04-05 DIAGNOSIS — R918 Other nonspecific abnormal finding of lung field: Secondary | ICD-10-CM | POA: Diagnosis not present

## 2021-04-05 DIAGNOSIS — Z923 Personal history of irradiation: Secondary | ICD-10-CM | POA: Insufficient documentation

## 2021-04-05 NOTE — Progress Notes (Signed)
Radiation Oncology Follow up Note  Name: Erik Collins   Date:   04/05/2021 MRN:  [id number] DOB: 1945/02/29    This 76 y.o. male presents to the clinic today for 1 month follow-up status post SBRT to his right upper lobe for stage I non-small cell lung cancer previously treated to his left lower lobe with a left lower lobectomy and CABG at [facility] in 2011.  REFERRING PROVIDER: Perrin Maltese, MD  HPI: Patient is a 76 year old male now out 1 month having completed SBRT to his right upper lobe for a stage I non-small cell lung cancer.  Seen today in routine follow-up he is doing well specifically denies cough any chest tightness or any change in his pulmonary function..  COMPLICATIONS OF TREATMENT: none  FOLLOW UP COMPLIANCE: keeps appointments   PHYSICAL EXAM:  BP 140/67    Pulse 63    Temp (!) 96.7 F (35.9 C)    Resp 18    Ht 5\' 11"  (1.803 m)    Wt 188 lb 11.2 oz (85.6 kg)    BMI 26.32 kg/m  Well-developed well-nourished patient in NAD. HEENT reveals PERLA, EOMI, discs not visualized.  Oral cavity is clear. No oral mucosal lesions are identified. Neck is clear without evidence of cervical or supraclavicular adenopathy. Lungs are clear to A&P. Cardiac examination is essentially unremarkable with regular rate and rhythm without murmur rub or thrill. Abdomen is benign with no organomegaly or masses noted. Motor sensory and DTR levels are equal and symmetric in the upper and lower extremities. Cranial nerves II through XII are grossly intact. Proprioception is intact. No peripheral adenopathy or edema is identified. No motor or sensory levels are noted. Crude visual fields are within normal range.  RADIOLOGY RESULTS: No current films to review  PLAN: Present time patient is doing well with extremely low side effect profile.  I am pleased with his overall progress.  Of asked to see back in 3 months with a follow-up CT scan at that time.  Patient comprehends my recommendations well.  I would like  to take this opportunity to thank you for allowing me to participate in the care of your patient.Noreene Filbert, MD

## 2021-04-05 NOTE — Progress Notes (Signed)
ct 

## 2021-04-19 DIAGNOSIS — I10 Essential (primary) hypertension: Secondary | ICD-10-CM | POA: Diagnosis not present

## 2021-04-19 DIAGNOSIS — E1140 Type 2 diabetes mellitus with diabetic neuropathy, unspecified: Secondary | ICD-10-CM | POA: Diagnosis not present

## 2021-04-19 DIAGNOSIS — E785 Hyperlipidemia, unspecified: Secondary | ICD-10-CM | POA: Diagnosis not present

## 2021-04-25 DIAGNOSIS — E1140 Type 2 diabetes mellitus with diabetic neuropathy, unspecified: Secondary | ICD-10-CM | POA: Diagnosis not present

## 2021-04-25 DIAGNOSIS — I2510 Atherosclerotic heart disease of native coronary artery without angina pectoris: Secondary | ICD-10-CM | POA: Diagnosis not present

## 2021-04-25 DIAGNOSIS — E785 Hyperlipidemia, unspecified: Secondary | ICD-10-CM | POA: Diagnosis not present

## 2021-04-25 DIAGNOSIS — I10 Essential (primary) hypertension: Secondary | ICD-10-CM | POA: Diagnosis not present

## 2021-05-02 DIAGNOSIS — I739 Peripheral vascular disease, unspecified: Secondary | ICD-10-CM | POA: Diagnosis not present

## 2021-05-02 DIAGNOSIS — I10 Essential (primary) hypertension: Secondary | ICD-10-CM | POA: Diagnosis not present

## 2021-05-02 DIAGNOSIS — R0602 Shortness of breath: Secondary | ICD-10-CM | POA: Diagnosis not present

## 2021-05-02 DIAGNOSIS — G459 Transient cerebral ischemic attack, unspecified: Secondary | ICD-10-CM | POA: Diagnosis not present

## 2021-05-02 DIAGNOSIS — I2510 Atherosclerotic heart disease of native coronary artery without angina pectoris: Secondary | ICD-10-CM | POA: Diagnosis not present

## 2021-05-02 DIAGNOSIS — G4739 Other sleep apnea: Secondary | ICD-10-CM | POA: Diagnosis not present

## 2021-05-02 DIAGNOSIS — I340 Nonrheumatic mitral (valve) insufficiency: Secondary | ICD-10-CM | POA: Diagnosis not present

## 2021-05-02 DIAGNOSIS — I351 Nonrheumatic aortic (valve) insufficiency: Secondary | ICD-10-CM | POA: Diagnosis not present

## 2021-05-02 DIAGNOSIS — I25810 Atherosclerosis of coronary artery bypass graft(s) without angina pectoris: Secondary | ICD-10-CM | POA: Diagnosis not present

## 2021-05-08 DIAGNOSIS — Z20822 Contact with and (suspected) exposure to covid-19: Secondary | ICD-10-CM | POA: Diagnosis not present

## 2021-06-04 DIAGNOSIS — E119 Type 2 diabetes mellitus without complications: Secondary | ICD-10-CM | POA: Diagnosis not present

## 2021-06-04 DIAGNOSIS — Z96652 Presence of left artificial knee joint: Secondary | ICD-10-CM | POA: Diagnosis not present

## 2021-06-04 DIAGNOSIS — M25561 Pain in right knee: Secondary | ICD-10-CM | POA: Diagnosis not present

## 2021-06-12 DIAGNOSIS — M170 Bilateral primary osteoarthritis of knee: Secondary | ICD-10-CM | POA: Diagnosis not present

## 2021-06-12 DIAGNOSIS — M1711 Unilateral primary osteoarthritis, right knee: Secondary | ICD-10-CM | POA: Diagnosis not present

## 2021-06-12 DIAGNOSIS — M25561 Pain in right knee: Secondary | ICD-10-CM | POA: Diagnosis not present

## 2021-06-12 DIAGNOSIS — M1712 Unilateral primary osteoarthritis, left knee: Secondary | ICD-10-CM | POA: Diagnosis not present

## 2021-06-12 DIAGNOSIS — M25562 Pain in left knee: Secondary | ICD-10-CM | POA: Diagnosis not present

## 2021-06-18 DIAGNOSIS — Z20822 Contact with and (suspected) exposure to covid-19: Secondary | ICD-10-CM | POA: Diagnosis not present

## 2021-06-21 DIAGNOSIS — Z20822 Contact with and (suspected) exposure to covid-19: Secondary | ICD-10-CM | POA: Diagnosis not present

## 2021-06-26 DIAGNOSIS — M25562 Pain in left knee: Secondary | ICD-10-CM | POA: Diagnosis not present

## 2021-06-26 DIAGNOSIS — Z96652 Presence of left artificial knee joint: Secondary | ICD-10-CM | POA: Diagnosis not present

## 2021-06-26 DIAGNOSIS — M25561 Pain in right knee: Secondary | ICD-10-CM | POA: Diagnosis not present

## 2021-06-26 DIAGNOSIS — M1711 Unilateral primary osteoarthritis, right knee: Secondary | ICD-10-CM | POA: Diagnosis not present

## 2021-07-03 DIAGNOSIS — M1711 Unilateral primary osteoarthritis, right knee: Secondary | ICD-10-CM | POA: Diagnosis not present

## 2021-07-03 DIAGNOSIS — M25562 Pain in left knee: Secondary | ICD-10-CM | POA: Diagnosis not present

## 2021-07-03 DIAGNOSIS — M25561 Pain in right knee: Secondary | ICD-10-CM | POA: Diagnosis not present

## 2021-07-05 ENCOUNTER — Other Ambulatory Visit: Payer: Self-pay

## 2021-07-05 ENCOUNTER — Ambulatory Visit
Admission: RE | Admit: 2021-07-05 | Discharge: 2021-07-05 | Disposition: A | Discharge status: Home / Self Care | Payer: Medicare Other | Source: Ambulatory Visit | Attending: Radiation Oncology | Admitting: Radiation Oncology

## 2021-07-05 DIAGNOSIS — R911 Solitary pulmonary nodule: Secondary | ICD-10-CM | POA: Diagnosis not present

## 2021-07-05 DIAGNOSIS — J432 Centrilobular emphysema: Secondary | ICD-10-CM | POA: Diagnosis not present

## 2021-07-05 LAB — POCT I-STAT CREATININE: Creatinine, Ser: 1.4 mg/dL — ABNORMAL HIGH (ref 0.61–1.24)

## 2021-07-05 MED ORDER — IOHEXOL 300 MG/ML  SOLN
60.0000 mL | Freq: Once | INTRAMUSCULAR | Status: AC | PRN
  Administered 2021-07-05: 60 mL via INTRAVENOUS

## 2021-07-10 DIAGNOSIS — M1711 Unilateral primary osteoarthritis, right knee: Secondary | ICD-10-CM | POA: Diagnosis not present

## 2021-07-10 DIAGNOSIS — M25561 Pain in right knee: Secondary | ICD-10-CM | POA: Diagnosis not present

## 2021-07-12 ENCOUNTER — Ambulatory Visit
Admission: RE | Admit: 2021-07-12 | Discharge: 2021-07-12 | Disposition: A | Discharge status: Home / Self Care | Payer: Medicare Other | Source: Ambulatory Visit | Attending: Radiation Oncology | Admitting: Radiation Oncology

## 2021-07-12 ENCOUNTER — Encounter: Payer: Self-pay | Admitting: Radiation Oncology

## 2021-07-12 VITALS — BP 134/57 | HR 66 | Wt 188.9 lb

## 2021-07-12 DIAGNOSIS — R918 Other nonspecific abnormal finding of lung field: Secondary | ICD-10-CM | POA: Diagnosis not present

## 2021-07-12 DIAGNOSIS — R911 Solitary pulmonary nodule: Secondary | ICD-10-CM

## 2021-07-12 DIAGNOSIS — M25561 Pain in right knee: Secondary | ICD-10-CM | POA: Diagnosis not present

## 2021-07-12 DIAGNOSIS — E119 Type 2 diabetes mellitus without complications: Secondary | ICD-10-CM | POA: Diagnosis not present

## 2021-07-12 DIAGNOSIS — Z87891 Personal history of nicotine dependence: Secondary | ICD-10-CM | POA: Diagnosis not present

## 2021-07-12 DIAGNOSIS — Z08 Encounter for follow-up examination after completed treatment for malignant neoplasm: Secondary | ICD-10-CM | POA: Diagnosis not present

## 2021-07-12 DIAGNOSIS — Z923 Personal history of irradiation: Secondary | ICD-10-CM | POA: Insufficient documentation

## 2021-07-12 DIAGNOSIS — C3411 Malignant neoplasm of upper lobe, right bronchus or lung: Secondary | ICD-10-CM | POA: Diagnosis not present

## 2021-07-12 NOTE — Progress Notes (Signed)
Radiation Oncology Follow up Note  Name: Erik Collins   Date:   07/12/2021 MRN:  [id number] DOB: 1945/03/29    This 76 y.o. male presents to the clinic today for 28-month follow-up status post SBRT to his right upper lobe for stage I non-small cell lung cancer previously treated to his left lower lobe with a left lower lobectomy and CABG at [facility] in 2011.  REFERRING PROVIDER: Perrin Maltese, MD  HPI: Patient is a 76 year old male now out 4 months having completed SBRT to his right upper lobe for stage I non-small cell lung cancer seen today in routine follow-up he is doing well specifically [place] cough hemoptysis or chest tightness.  P.o. intake is good.  He had a recent CT scan.  Showing slight decrease in the spiculated nodule of the right upper lobe with bandlike consolidation all consistent with treatment response and development of fibrosis.  No other evidence of lymphadenopathy or metastatic disease in the chest is noted.  COMPLICATIONS OF TREATMENT: none  FOLLOW UP COMPLIANCE: keeps appointments   PHYSICAL EXAM:  BP (!) 134/57   Pulse 66   Wt 188 lb 14.4 oz (85.7 kg)   BMI 26.35 kg/m  Well-developed well-nourished patient in NAD. HEENT reveals PERLA, EOMI, discs not visualized.  Oral cavity is clear. No oral mucosal lesions are identified. Neck is clear without evidence of cervical or supraclavicular adenopathy. Lungs are clear to A&P. Cardiac examination is essentially unremarkable with regular rate and rhythm without murmur rub or thrill. Abdomen is benign with no organomegaly or masses noted. Motor sensory and DTR levels are equal and symmetric in the upper and lower extremities. Cranial nerves II through XII are grossly intact. Proprioception is intact. No peripheral adenopathy or edema is identified. No motor or sensory levels are noted. Crude visual fields are within normal range.  RADIOLOGY RESULTS: CT scans reviewed compatible with above-stated findings  PLAN: Present time  patient is a very low side effect profile.  CT scan shows excellent response to treatment with some scarring which we would expect at this time interval.  I have asked to see him back in 6 months with another CT scan of his chest.  Patient wife both comprehend the recommendations well.  I would like to take this opportunity to thank you for allowing me to participate in the care of your patient.Noreene Filbert, MD

## 2021-07-17 ENCOUNTER — Other Ambulatory Visit: Payer: Self-pay | Admitting: *Deleted

## 2021-07-17 DIAGNOSIS — M1711 Unilateral primary osteoarthritis, right knee: Secondary | ICD-10-CM | POA: Diagnosis not present

## 2021-07-17 DIAGNOSIS — M25561 Pain in right knee: Secondary | ICD-10-CM | POA: Diagnosis not present

## 2021-07-17 DIAGNOSIS — R911 Solitary pulmonary nodule: Secondary | ICD-10-CM

## 2021-07-24 DIAGNOSIS — E785 Hyperlipidemia, unspecified: Secondary | ICD-10-CM | POA: Diagnosis not present

## 2021-07-24 DIAGNOSIS — D509 Iron deficiency anemia, unspecified: Secondary | ICD-10-CM | POA: Diagnosis not present

## 2021-07-24 DIAGNOSIS — I10 Essential (primary) hypertension: Secondary | ICD-10-CM | POA: Diagnosis not present

## 2021-07-24 DIAGNOSIS — E1140 Type 2 diabetes mellitus with diabetic neuropathy, unspecified: Secondary | ICD-10-CM | POA: Diagnosis not present

## 2021-07-25 DIAGNOSIS — I10 Essential (primary) hypertension: Secondary | ICD-10-CM | POA: Diagnosis not present

## 2021-07-25 DIAGNOSIS — H47011 Ischemic optic neuropathy, right eye: Secondary | ICD-10-CM | POA: Diagnosis not present

## 2021-07-25 DIAGNOSIS — I2510 Atherosclerotic heart disease of native coronary artery without angina pectoris: Secondary | ICD-10-CM | POA: Diagnosis not present

## 2021-07-25 DIAGNOSIS — E785 Hyperlipidemia, unspecified: Secondary | ICD-10-CM | POA: Diagnosis not present

## 2021-07-25 DIAGNOSIS — E119 Type 2 diabetes mellitus without complications: Secondary | ICD-10-CM | POA: Diagnosis not present

## 2021-07-25 DIAGNOSIS — E1140 Type 2 diabetes mellitus with diabetic neuropathy, unspecified: Secondary | ICD-10-CM | POA: Diagnosis not present

## 2021-07-25 DIAGNOSIS — I509 Heart failure, unspecified: Secondary | ICD-10-CM | POA: Diagnosis not present

## 2021-08-08 DIAGNOSIS — I25810 Atherosclerosis of coronary artery bypass graft(s) without angina pectoris: Secondary | ICD-10-CM | POA: Diagnosis not present

## 2021-08-08 DIAGNOSIS — I2510 Atherosclerotic heart disease of native coronary artery without angina pectoris: Secondary | ICD-10-CM | POA: Diagnosis not present

## 2021-08-08 DIAGNOSIS — I10 Essential (primary) hypertension: Secondary | ICD-10-CM | POA: Diagnosis not present

## 2021-08-08 DIAGNOSIS — E785 Hyperlipidemia, unspecified: Secondary | ICD-10-CM | POA: Diagnosis not present

## 2021-08-08 DIAGNOSIS — I739 Peripheral vascular disease, unspecified: Secondary | ICD-10-CM | POA: Diagnosis not present

## 2021-08-08 DIAGNOSIS — I340 Nonrheumatic mitral (valve) insufficiency: Secondary | ICD-10-CM | POA: Diagnosis not present

## 2021-08-08 DIAGNOSIS — G4739 Other sleep apnea: Secondary | ICD-10-CM | POA: Diagnosis not present

## 2021-08-08 DIAGNOSIS — J449 Chronic obstructive pulmonary disease, unspecified: Secondary | ICD-10-CM | POA: Diagnosis not present

## 2021-08-08 DIAGNOSIS — E1140 Type 2 diabetes mellitus with diabetic neuropathy, unspecified: Secondary | ICD-10-CM | POA: Diagnosis not present

## 2021-08-08 DIAGNOSIS — G459 Transient cerebral ischemic attack, unspecified: Secondary | ICD-10-CM | POA: Diagnosis not present

## 2021-08-08 DIAGNOSIS — I351 Nonrheumatic aortic (valve) insufficiency: Secondary | ICD-10-CM | POA: Diagnosis not present

## 2021-08-28 DIAGNOSIS — E1140 Type 2 diabetes mellitus with diabetic neuropathy, unspecified: Secondary | ICD-10-CM | POA: Diagnosis not present

## 2021-08-29 DIAGNOSIS — I10 Essential (primary) hypertension: Secondary | ICD-10-CM | POA: Diagnosis not present

## 2021-08-29 DIAGNOSIS — E785 Hyperlipidemia, unspecified: Secondary | ICD-10-CM | POA: Diagnosis not present

## 2021-08-29 DIAGNOSIS — E1140 Type 2 diabetes mellitus with diabetic neuropathy, unspecified: Secondary | ICD-10-CM | POA: Diagnosis not present

## 2021-08-29 DIAGNOSIS — I2510 Atherosclerotic heart disease of native coronary artery without angina pectoris: Secondary | ICD-10-CM | POA: Diagnosis not present

## 2021-10-17 DIAGNOSIS — E119 Type 2 diabetes mellitus without complications: Secondary | ICD-10-CM | POA: Diagnosis not present

## 2021-10-17 DIAGNOSIS — I2510 Atherosclerotic heart disease of native coronary artery without angina pectoris: Secondary | ICD-10-CM | POA: Diagnosis not present

## 2021-10-17 DIAGNOSIS — I10 Essential (primary) hypertension: Secondary | ICD-10-CM | POA: Diagnosis not present

## 2021-10-30 DIAGNOSIS — E1140 Type 2 diabetes mellitus with diabetic neuropathy, unspecified: Secondary | ICD-10-CM | POA: Diagnosis not present

## 2021-10-30 DIAGNOSIS — E785 Hyperlipidemia, unspecified: Secondary | ICD-10-CM | POA: Diagnosis not present

## 2021-10-30 DIAGNOSIS — I10 Essential (primary) hypertension: Secondary | ICD-10-CM | POA: Diagnosis not present

## 2021-10-31 DIAGNOSIS — I2510 Atherosclerotic heart disease of native coronary artery without angina pectoris: Secondary | ICD-10-CM | POA: Diagnosis not present

## 2021-10-31 DIAGNOSIS — I10 Essential (primary) hypertension: Secondary | ICD-10-CM | POA: Diagnosis not present

## 2021-10-31 DIAGNOSIS — E785 Hyperlipidemia, unspecified: Secondary | ICD-10-CM | POA: Diagnosis not present

## 2021-10-31 DIAGNOSIS — E1140 Type 2 diabetes mellitus with diabetic neuropathy, unspecified: Secondary | ICD-10-CM | POA: Diagnosis not present

## 2021-12-04 DIAGNOSIS — E119 Type 2 diabetes mellitus without complications: Secondary | ICD-10-CM | POA: Diagnosis not present

## 2021-12-04 DIAGNOSIS — M25561 Pain in right knee: Secondary | ICD-10-CM | POA: Diagnosis not present

## 2021-12-04 DIAGNOSIS — M1711 Unilateral primary osteoarthritis, right knee: Secondary | ICD-10-CM | POA: Diagnosis not present

## 2021-12-12 DIAGNOSIS — E785 Hyperlipidemia, unspecified: Secondary | ICD-10-CM | POA: Diagnosis not present

## 2021-12-12 DIAGNOSIS — E1140 Type 2 diabetes mellitus with diabetic neuropathy, unspecified: Secondary | ICD-10-CM | POA: Diagnosis not present

## 2021-12-12 DIAGNOSIS — I10 Essential (primary) hypertension: Secondary | ICD-10-CM | POA: Diagnosis not present

## 2021-12-12 DIAGNOSIS — J029 Acute pharyngitis, unspecified: Secondary | ICD-10-CM | POA: Diagnosis not present

## 2021-12-16 DIAGNOSIS — J40 Bronchitis, not specified as acute or chronic: Secondary | ICD-10-CM | POA: Diagnosis not present

## 2021-12-16 DIAGNOSIS — U071 COVID-19: Secondary | ICD-10-CM | POA: Diagnosis not present

## 2021-12-16 DIAGNOSIS — R059 Cough, unspecified: Secondary | ICD-10-CM | POA: Diagnosis not present

## 2021-12-16 DIAGNOSIS — N189 Chronic kidney disease, unspecified: Secondary | ICD-10-CM | POA: Diagnosis not present

## 2021-12-16 DIAGNOSIS — R051 Acute cough: Secondary | ICD-10-CM | POA: Diagnosis not present

## 2021-12-16 DIAGNOSIS — Z03818 Encounter for observation for suspected exposure to other biological agents ruled out: Secondary | ICD-10-CM | POA: Diagnosis not present

## 2021-12-17 DIAGNOSIS — I2510 Atherosclerotic heart disease of native coronary artery without angina pectoris: Secondary | ICD-10-CM | POA: Diagnosis not present

## 2021-12-17 DIAGNOSIS — I10 Essential (primary) hypertension: Secondary | ICD-10-CM | POA: Diagnosis not present

## 2021-12-17 DIAGNOSIS — E119 Type 2 diabetes mellitus without complications: Secondary | ICD-10-CM | POA: Diagnosis not present

## 2021-12-24 DIAGNOSIS — R053 Chronic cough: Secondary | ICD-10-CM | POA: Diagnosis not present

## 2021-12-24 DIAGNOSIS — R059 Cough, unspecified: Secondary | ICD-10-CM | POA: Diagnosis not present

## 2021-12-24 DIAGNOSIS — U071 COVID-19: Secondary | ICD-10-CM | POA: Diagnosis not present

## 2021-12-24 DIAGNOSIS — J441 Chronic obstructive pulmonary disease with (acute) exacerbation: Secondary | ICD-10-CM | POA: Diagnosis not present

## 2022-01-02 DIAGNOSIS — E1140 Type 2 diabetes mellitus with diabetic neuropathy, unspecified: Secondary | ICD-10-CM | POA: Diagnosis not present

## 2022-01-02 DIAGNOSIS — I739 Peripheral vascular disease, unspecified: Secondary | ICD-10-CM | POA: Diagnosis not present

## 2022-01-02 DIAGNOSIS — G4739 Other sleep apnea: Secondary | ICD-10-CM | POA: Diagnosis not present

## 2022-01-02 DIAGNOSIS — G459 Transient cerebral ischemic attack, unspecified: Secondary | ICD-10-CM | POA: Diagnosis not present

## 2022-01-02 DIAGNOSIS — I10 Essential (primary) hypertension: Secondary | ICD-10-CM | POA: Diagnosis not present

## 2022-01-02 DIAGNOSIS — I2510 Atherosclerotic heart disease of native coronary artery without angina pectoris: Secondary | ICD-10-CM | POA: Diagnosis not present

## 2022-01-02 DIAGNOSIS — I25810 Atherosclerosis of coronary artery bypass graft(s) without angina pectoris: Secondary | ICD-10-CM | POA: Diagnosis not present

## 2022-01-02 DIAGNOSIS — J449 Chronic obstructive pulmonary disease, unspecified: Secondary | ICD-10-CM | POA: Diagnosis not present

## 2022-01-02 DIAGNOSIS — I351 Nonrheumatic aortic (valve) insufficiency: Secondary | ICD-10-CM | POA: Diagnosis not present

## 2022-01-02 DIAGNOSIS — I340 Nonrheumatic mitral (valve) insufficiency: Secondary | ICD-10-CM | POA: Diagnosis not present

## 2022-01-02 DIAGNOSIS — R55 Syncope and collapse: Secondary | ICD-10-CM | POA: Diagnosis not present

## 2022-01-02 DIAGNOSIS — E785 Hyperlipidemia, unspecified: Secondary | ICD-10-CM | POA: Diagnosis not present

## 2022-01-20 ENCOUNTER — Ambulatory Visit
Admission: RE | Admit: 2022-01-20 | Discharge: 2022-01-20 | Disposition: A | Discharge status: Home / Self Care | Payer: Medicare Other | Source: Ambulatory Visit | Attending: Radiation Oncology | Admitting: Radiation Oncology

## 2022-01-20 DIAGNOSIS — J432 Centrilobular emphysema: Secondary | ICD-10-CM | POA: Diagnosis not present

## 2022-01-20 DIAGNOSIS — R911 Solitary pulmonary nodule: Secondary | ICD-10-CM | POA: Insufficient documentation

## 2022-01-20 DIAGNOSIS — C3490 Malignant neoplasm of unspecified part of unspecified bronchus or lung: Secondary | ICD-10-CM | POA: Diagnosis not present

## 2022-01-20 LAB — POCT I-STAT CREATININE: Creatinine, Ser: 1.3 mg/dL — ABNORMAL HIGH (ref 0.61–1.24)

## 2022-01-20 MED ORDER — IOHEXOL 300 MG/ML  SOLN
75.0000 mL | Freq: Once | INTRAMUSCULAR | Status: AC | PRN
  Administered 2022-01-20: 75 mL via INTRAVENOUS

## 2022-01-23 ENCOUNTER — Ambulatory Visit (INDEPENDENT_AMBULATORY_CARE_PROVIDER_SITE_OTHER): Payer: Medicare Other | Admitting: Nurse Practitioner

## 2022-01-23 ENCOUNTER — Encounter (INDEPENDENT_AMBULATORY_CARE_PROVIDER_SITE_OTHER): Payer: Medicare Other

## 2022-01-23 ENCOUNTER — Encounter (INDEPENDENT_AMBULATORY_CARE_PROVIDER_SITE_OTHER): Payer: Self-pay

## 2022-01-29 ENCOUNTER — Other Ambulatory Visit: Payer: Self-pay | Admitting: *Deleted

## 2022-01-29 ENCOUNTER — Ambulatory Visit
Admission: RE | Admit: 2022-01-29 | Discharge: 2022-01-29 | Disposition: A | Discharge status: Home / Self Care | Payer: Medicare Other | Source: Ambulatory Visit | Attending: Radiation Oncology | Admitting: Radiation Oncology

## 2022-01-29 VITALS — BP 132/68 | HR 66 | Temp 97.1°F | Resp 16 | Ht 71.5 in | Wt 180.8 lb

## 2022-01-29 DIAGNOSIS — R918 Other nonspecific abnormal finding of lung field: Secondary | ICD-10-CM | POA: Insufficient documentation

## 2022-01-29 DIAGNOSIS — C3411 Malignant neoplasm of upper lobe, right bronchus or lung: Secondary | ICD-10-CM | POA: Diagnosis not present

## 2022-01-29 DIAGNOSIS — R911 Solitary pulmonary nodule: Secondary | ICD-10-CM

## 2022-01-29 DIAGNOSIS — Z923 Personal history of irradiation: Secondary | ICD-10-CM | POA: Insufficient documentation

## 2022-01-29 DIAGNOSIS — E785 Hyperlipidemia, unspecified: Secondary | ICD-10-CM | POA: Diagnosis not present

## 2022-01-29 DIAGNOSIS — Z85118 Personal history of other malignant neoplasm of bronchus and lung: Secondary | ICD-10-CM | POA: Diagnosis not present

## 2022-01-29 DIAGNOSIS — Z87891 Personal history of nicotine dependence: Secondary | ICD-10-CM | POA: Diagnosis not present

## 2022-01-29 DIAGNOSIS — I10 Essential (primary) hypertension: Secondary | ICD-10-CM | POA: Diagnosis not present

## 2022-01-29 DIAGNOSIS — C3491 Malignant neoplasm of unspecified part of right bronchus or lung: Secondary | ICD-10-CM

## 2022-01-29 DIAGNOSIS — E669 Obesity, unspecified: Secondary | ICD-10-CM | POA: Diagnosis not present

## 2022-01-29 DIAGNOSIS — E119 Type 2 diabetes mellitus without complications: Secondary | ICD-10-CM | POA: Diagnosis not present

## 2022-01-29 DIAGNOSIS — D509 Iron deficiency anemia, unspecified: Secondary | ICD-10-CM | POA: Diagnosis not present

## 2022-01-29 NOTE — Progress Notes (Signed)
Radiation Oncology Follow up Note  Name: Erik Collins   Date:   01/29/2022 MRN:  [id number] DOB: [birth year]    This 76 y.o. male presents to the clinic today for 44-month follow-up status post SBRT to his right upper lobe for stage I non-small cell lung cancer.  He also had treatment to his left lower lobe with a left lower lobectomy and CABG at [facility] in 2011.  REFERRING PROVIDER: Perrin Maltese, MD  HPI: Patient is a 76 year old male now out 10 months having completed SBRT to his right upper lobe for stage I non-small cell lung cancer seen today in routine follow-up he is doing well.  He specifically denies cough hemoptysis chest tightness or any change in his pulmonary status..  He had a recent CT scan of his chest showing right upper lobe pulmonary nodule decreased in size consistent with treatment response.  No evidence of progressive or other disease noted.  COMPLICATIONS OF TREATMENT: none  FOLLOW UP COMPLIANCE: keeps appointments   PHYSICAL EXAM:  BP 132/68   Pulse 66   Temp (!) 97.1 F (36.2 C)   Resp 16   Ht 5' 11.5" (1.816 m)   Wt 180 lb 12.8 oz (82 kg)   BMI 24.87 kg/m  Well-developed well-nourished patient in NAD. HEENT reveals PERLA, EOMI, discs not visualized.  Oral cavity is clear. No oral mucosal lesions are identified. Neck is clear without evidence of cervical or supraclavicular adenopathy. Lungs are clear to A&P. Cardiac examination is essentially unremarkable with regular rate and rhythm without murmur rub or thrill. Abdomen is benign with no organomegaly or masses noted. Motor sensory and DTR levels are equal and symmetric in the upper and lower extremities. Cranial nerves II through XII are grossly intact. Proprioception is intact. No peripheral adenopathy or edema is identified. No motor or sensory levels are noted. Crude visual fields are within normal range.  RADIOLOGY RESULTS: CT scan reviewed compatible with above-stated findings  PLAN: The present time  patient is doing well with no evidence of disease excellent response by CT criteria to his SBRT.  And pleased with his overall progress.  I have asked to see him back in 6 months for follow-up with a repeat CT scan and then will go to once a year follow-up visits.  Patient comprehends my recommendations well.  I would like to take this opportunity to thank you for allowing me to participate in the care of your patient.Noreene Filbert, MD

## 2022-01-29 NOTE — Progress Notes (Signed)
Survivorship Care Plan visit completed.  Treatment summary reviewed and given to patient.  ASCO answers booklet reviewed and given to patient.  CARE program and Cancer Transitions discussed with patient along with other resources cancer center offers to patients and caregivers.  Patient verbalized understanding.    

## 2022-01-30 DIAGNOSIS — Z739 Problem related to life management difficulty, unspecified: Secondary | ICD-10-CM | POA: Diagnosis not present

## 2022-01-30 DIAGNOSIS — E785 Hyperlipidemia, unspecified: Secondary | ICD-10-CM | POA: Diagnosis not present

## 2022-01-30 DIAGNOSIS — Z0000 Encounter for general adult medical examination without abnormal findings: Secondary | ICD-10-CM | POA: Diagnosis not present

## 2022-01-30 DIAGNOSIS — I10 Essential (primary) hypertension: Secondary | ICD-10-CM | POA: Diagnosis not present

## 2022-01-30 DIAGNOSIS — E1140 Type 2 diabetes mellitus with diabetic neuropathy, unspecified: Secondary | ICD-10-CM | POA: Diagnosis not present

## 2022-01-30 DIAGNOSIS — Z0001 Encounter for general adult medical examination with abnormal findings: Secondary | ICD-10-CM | POA: Diagnosis not present

## 2022-02-03 DIAGNOSIS — E1140 Type 2 diabetes mellitus with diabetic neuropathy, unspecified: Secondary | ICD-10-CM | POA: Diagnosis not present

## 2022-02-06 DIAGNOSIS — I10 Essential (primary) hypertension: Secondary | ICD-10-CM | POA: Diagnosis not present

## 2022-02-06 DIAGNOSIS — I739 Peripheral vascular disease, unspecified: Secondary | ICD-10-CM | POA: Diagnosis not present

## 2022-02-06 DIAGNOSIS — R0602 Shortness of breath: Secondary | ICD-10-CM | POA: Diagnosis not present

## 2022-02-06 DIAGNOSIS — I351 Nonrheumatic aortic (valve) insufficiency: Secondary | ICD-10-CM | POA: Diagnosis not present

## 2022-02-06 DIAGNOSIS — I25810 Atherosclerosis of coronary artery bypass graft(s) without angina pectoris: Secondary | ICD-10-CM | POA: Diagnosis not present

## 2022-02-06 DIAGNOSIS — I2510 Atherosclerotic heart disease of native coronary artery without angina pectoris: Secondary | ICD-10-CM | POA: Diagnosis not present

## 2022-02-06 DIAGNOSIS — E785 Hyperlipidemia, unspecified: Secondary | ICD-10-CM | POA: Diagnosis not present

## 2022-02-06 DIAGNOSIS — G4739 Other sleep apnea: Secondary | ICD-10-CM | POA: Diagnosis not present

## 2022-02-06 DIAGNOSIS — I340 Nonrheumatic mitral (valve) insufficiency: Secondary | ICD-10-CM | POA: Diagnosis not present

## 2022-03-21 ENCOUNTER — Telehealth: Payer: Self-pay

## 2022-03-27 ENCOUNTER — Other Ambulatory Visit: Payer: Self-pay

## 2022-03-27 MED ORDER — TOUJEO MAX SOLOSTAR 300 UNIT/ML ~~LOC~~ SOPN: 40.0000 [IU] | PEN_INJECTOR | Freq: Every day | SUBCUTANEOUS | 1 refills | Status: AC

## 2022-04-03 ENCOUNTER — Other Ambulatory Visit: Payer: Self-pay | Admitting: Cardiovascular Disease

## 2022-04-07 ENCOUNTER — Other Ambulatory Visit: Payer: Self-pay

## 2022-04-08 MED ORDER — OMEPRAZOLE 20 MG PO CPDR: 20.0000 mg | DELAYED_RELEASE_CAPSULE | Freq: Every day | ORAL | 0 refills | Status: DC

## 2022-04-08 MED ORDER — NEXLETOL 180 MG PO TABS: 180.0000 mg | ORAL_TABLET | Freq: Every day | ORAL | 0 refills | Status: DC

## 2022-04-25 ENCOUNTER — Other Ambulatory Visit: Payer: Self-pay

## 2022-04-25 MED ORDER — CARVEDILOL 12.5 MG PO TABS: 12.5000 mg | ORAL_TABLET | Freq: Two times a day (BID) | ORAL | 3 refills | Status: DC

## 2022-05-05 ENCOUNTER — Other Ambulatory Visit: Payer: Medicare Other

## 2022-05-05 DIAGNOSIS — I10 Essential (primary) hypertension: Secondary | ICD-10-CM

## 2022-05-05 DIAGNOSIS — E782 Mixed hyperlipidemia: Secondary | ICD-10-CM

## 2022-05-05 DIAGNOSIS — E1151 Type 2 diabetes mellitus with diabetic peripheral angiopathy without gangrene: Secondary | ICD-10-CM | POA: Diagnosis not present

## 2022-05-06 LAB — CMP14+EGFR
ALT: 13 IU/L (ref 0–44)
AST: 19 IU/L (ref 0–40)
Albumin/Globulin Ratio: 1.8 (ref 1.2–2.2)
Albumin: 4.3 g/dL (ref 3.8–4.8)
Alkaline Phosphatase: 49 IU/L (ref 44–121)
BUN/Creatinine Ratio: 25 — ABNORMAL HIGH (ref 10–24)
BUN: 25 mg/dL (ref 8–27)
Bilirubin Total: 0.3 mg/dL (ref 0.0–1.2)
CO2: 20 mmol/L (ref 20–29)
Calcium: 9.2 mg/dL (ref 8.6–10.2)
Chloride: 110 mmol/L — ABNORMAL HIGH (ref 96–106)
Creatinine, Ser: 1.01 mg/dL (ref 0.76–1.27)
Globulin, Total: 2.4 g/dL (ref 1.5–4.5)
Glucose: 75 mg/dL (ref 70–99)
Potassium: 4.7 mmol/L (ref 3.5–5.2)
Sodium: 144 mmol/L (ref 134–144)
Total Protein: 6.7 g/dL (ref 6.0–8.5)
eGFR: 77 mL/min/{1.73_m2} (ref >59)

## 2022-05-06 LAB — LIPID PANEL
Chol/HDL Ratio: 3.2 ratio (ref 0.0–5.0)
Cholesterol, Total: 120 mg/dL (ref 100–199)
HDL: 38 mg/dL — ABNORMAL LOW (ref >39)
LDL Chol Calc (NIH): 72 mg/dL (ref 0–99)
Triglycerides: 40 mg/dL (ref 0–149)
VLDL Cholesterol Cal: 10 mg/dL (ref 5–40)

## 2022-05-06 LAB — CBC WITH DIFFERENTIAL
Basophils Absolute: 0 10*3/uL (ref 0.0–0.2)
Basos: 0 %
EOS (ABSOLUTE): 0.1 10*3/uL (ref 0.0–0.4)
Eos: 2 %
Hematocrit: 37.2 % — ABNORMAL LOW (ref 37.5–51.0)
Hemoglobin: 11.6 g/dL — ABNORMAL LOW (ref 13.0–17.7)
Immature Grans (Abs): 0 10*3/uL (ref 0.0–0.1)
Immature Granulocytes: 0 %
Lymphocytes Absolute: 1 10*3/uL (ref 0.7–3.1)
Lymphs: 19 %
MCH: 28.7 pg (ref 26.6–33.0)
MCHC: 31.2 g/dL — ABNORMAL LOW (ref 31.5–35.7)
MCV: 92 fL (ref 79–97)
Monocytes Absolute: 0.5 10*3/uL (ref 0.1–0.9)
Monocytes: 10 %
Neutrophils Absolute: 3.7 10*3/uL (ref 1.4–7.0)
Neutrophils: 69 %
RBC: 4.04 x10E6/uL — ABNORMAL LOW (ref 4.14–5.80)
RDW: 12.6 % (ref 11.6–15.4)
WBC: 5.4 10*3/uL (ref 3.4–10.8)

## 2022-05-06 LAB — TSH: TSH: 2.75 u[IU]/mL (ref 0.450–4.500)

## 2022-05-06 LAB — HEMOGLOBIN A1C
Est. average glucose Bld gHb Est-mCnc: 148 mg/dL
Hgb A1c MFr Bld: 6.8 % — ABNORMAL HIGH (ref 4.8–5.6)

## 2022-05-08 ENCOUNTER — Ambulatory Visit: Payer: Medicare Other | Admitting: Internal Medicine

## 2022-05-08 ENCOUNTER — Encounter: Payer: Self-pay | Admitting: Internal Medicine

## 2022-05-08 VITALS — BP 124/72 | HR 57 | Ht 71.0 in | Wt 181.4 lb

## 2022-05-08 DIAGNOSIS — I25119 Atherosclerotic heart disease of native coronary artery with unspecified angina pectoris: Secondary | ICD-10-CM

## 2022-05-08 DIAGNOSIS — J449 Chronic obstructive pulmonary disease, unspecified: Secondary | ICD-10-CM

## 2022-05-08 DIAGNOSIS — E782 Mixed hyperlipidemia: Secondary | ICD-10-CM | POA: Diagnosis not present

## 2022-05-08 DIAGNOSIS — I10 Essential (primary) hypertension: Secondary | ICD-10-CM | POA: Diagnosis not present

## 2022-05-08 DIAGNOSIS — M170 Bilateral primary osteoarthritis of knee: Secondary | ICD-10-CM | POA: Diagnosis not present

## 2022-05-08 DIAGNOSIS — E1165 Type 2 diabetes mellitus with hyperglycemia: Secondary | ICD-10-CM

## 2022-05-08 LAB — POCT CBG (FASTING - GLUCOSE)-MANUAL ENTRY: Glucose Fasting, POC: 162 mg/dL — AB (ref 70–99)

## 2022-05-08 NOTE — Progress Notes (Signed)
Established Patient Office Visit  Subjective:  Patient ID: Erik Collins, male    DOB: [birth year]  Age: 77 y.o. MRN: [id number]  Chief Complaint  Patient presents with   Follow-up    3 month follow up    Patient comes in for follow-up today.  He is accompanied by his wife.  Labs were done earlier and today he wants to discuss them.  His hemoglobin A1c has improved and he feels happy about it.  He continues to work and stays active.  Today he does not have any complaints of chest pain or shortness of breath.  However his knee joints bother him sometimes.    No other concerns at this time.   Past Medical History:  Diagnosis Date   Arthritis    osteoarthritis   Cancer ([place])    hx lung cancer   CHF (congestive heart failure) (HCC)    Complication of anesthesia    knee surgery caused arrythmia requiring a temporary pacemaker   COPD (chronic obstructive pulmonary disease) ([place])    Coronary artery disease    s/p CABG   Diabetes mellitus without complication ([place])    Diverticulosis    Duodenitis    Dysrhythmia    during total knee replacement patient required a temporary pacemaker   GERD (gastroesophageal reflux disease)    Hypercholesterolemia    Hyperplastic colon polyp    Hypertension    Internal hemorrhoids    Lung cancer ([place])    Lung cancer ([place])    Myocardial infarction ([place])    09/2009 f/up with Dr. Neoma Laming   Sleep apnea     Past Surgical History:  Procedure Laterality Date   BACK SURGERY     CATARACT EXTRACTION W/PHACO Left 11/09/2019   Procedure: CATARACT EXTRACTION PHACO AND INTRAOCULAR LENS PLACEMENT (IOC) LEFT DIABETIC MALYUGIN 5.91 01:01.0 9.6%;  Surgeon: Leandrew Koyanagi, MD;  Location: [place];  Service: Ophthalmology;  Laterality: Left;   CATARACT EXTRACTION W/PHACO Right 12/07/2019   Procedure: CATARACT EXTRACTION PHACO AND INTRAOCULAR LENS PLACEMENT (IOC) RIGHT DIABETIC MALYUGIN 6.38 01:05.1 9.8%;  Surgeon: Leandrew Koyanagi, MD;   Location: [place];  Service: Ophthalmology;  Laterality: Right;   COLONOSCOPY N/A 11/23/2019   Procedure: COLONOSCOPY;  Surgeon: Tol[place], [name], MD;  Location: [facility] ENDOSCOPY;  Service: Gastroenterology;  Laterality: N/A;   COLONOSCOPY WITH PROPOFOL N/A 11/09/2014   Procedure: COLONOSCOPY WITH PROPOFOL;  Surgeon: Josefine Class, MD;  Location: [facility] ENDOSCOPY;  Service: Endoscopy;  Laterality: N/A;   CORONARY ANGIOPLASTY     CORONARY ARTERY BYPASS GRAFT  09/2009   quadruple, done at [place]     ESOPHAGOGASTRODUODENOSCOPY (EGD) WITH PROPOFOL N/A 11/09/2014   Procedure: ESOPHAGOGASTRODUODENOSCOPY (EGD) WITH PROPOFOL;  Surgeon: Josefine Class, MD;  Location: [facility] ENDOSCOPY;  Service: Endoscopy;  Laterality: N/A;   left lower lobectomy  09/2009   at [place] MICRODISCECTOMY  12/17/2011   Procedure: LUMBAR LAMINECTOMY/DECOMPRESSION MICRODISCECTOMY 1 LEVEL;  Surgeon: Eustace Moore, MD;  Location: [place] NEURO ORS;  Service: Neurosurgery;  Laterality: Right;  Right Lumbar five-sacral one microdiscectomy   TEMPORARY PACEMAKER Right 07/10/2017   Procedure: TEMPORARY PACEMAKER;  Surgeon: Isaias Cowman, MD;  Location: [place] CV LAB;  Service: Cardiovascular;  Laterality: Right;   TOTAL KNEE ARTHROPLASTY Left 07/08/2017   Procedure: TOTAL KNEE ARTHROPLASTY;  Surgeon: Earnestine Leys, MD;  Location: [facility] ORS;  Service: Orthopedics;  Laterality: Left;    Social History  Socioeconomic History   Marital status: Married    Spouse name: Not on file   Number of children: Not on file   Years of education: Not on file   Highest education level: Not on file  Occupational History   Not on file  Tobacco Use   Smoking status: Former    Types: Cigarettes    Quit date: 09/19/2009    Years since quitting: 12.6   Smokeless tobacco: Never  Vaping Use   Vaping Use: Never used  Substance and Sexual Activity   Alcohol use: No    Drug use: No   Sexual activity: Not on file  Other Topics Concern   Not on file  Social History Narrative   Not on file   Social Determinants of Health   Financial Resource Strain: Not on file  Food Insecurity: Not on file  Transportation Needs: Not on file  Physical Activity: Not on file  Stress: Not on file  Social Connections: Not on file  Intimate Partner Violence: Not on file    Family History  Problem Relation Age of Onset   Heart disease Mother    Diabetes Mother    Heart disease Father    Stroke Father    Heart attack Father     Allergies  Allergen Reactions   Spironolactone    Statins     Review of Systems  Constitutional: Negative.   HENT: Negative.    Eyes: Negative.   Respiratory: Negative.    Cardiovascular: Negative.   Gastrointestinal: Negative.   Genitourinary: Negative.   Musculoskeletal: Negative.   Skin: Negative.   Neurological: Negative.   Psychiatric/Behavioral: Negative.         Objective:   BP 124/72   Pulse (!) 57   Ht 5\' 11"  (1.803 m)   Wt 181 lb 6.4 oz (82.3 kg)   SpO2 94%   BMI 25.30 kg/m   Vitals:   05/08/22 0945  BP: 124/72  Pulse: (!) 57  Height: 5\' 11"  (1.803 m)  Weight: 181 lb 6.4 oz (82.3 kg)  SpO2: 94%  BMI (Calculated): 25.31    Physical Exam Vitals and nursing note reviewed.  Constitutional:      Appearance: Normal appearance. He is normal weight.  HENT:     Head: Normocephalic and atraumatic.  Cardiovascular:     Rate and Rhythm: Normal rate and regular rhythm.     Pulses: Normal pulses.     Heart sounds: Normal heart sounds.  Pulmonary:     Effort: Pulmonary effort is normal.     Breath sounds: Normal breath sounds. No rhonchi or rales.  Abdominal:     General: Abdomen is flat. Bowel sounds are normal.     Palpations: Abdomen is soft.  Musculoskeletal:        General: Normal range of motion.     Cervical back: Normal range of motion and neck supple.  Skin:    General: Skin is warm and  dry.  Neurological:     General: No focal deficit present.     Mental Status: He is alert and oriented to person, place, and time. Mental status is at baseline.  Psychiatric:        Mood and Affect: Mood normal.        Behavior: Behavior normal.      Results for orders placed or performed in visit on 05/08/22  POCT CBG (Fasting - Glucose)  Result Value Ref Range   Glucose Fasting, POC 162 (A) 70 -  99 mg/dL    Recent Results (from the past 2160 hour(s))  Hemoglobin A1c     Status: Abnormal   Collection Time: 05/05/22  9:20 AM  Result Value Ref Range   Hgb A1c MFr Bld 6.8 (H) 4.8 - 5.6 %    Comment:          Prediabetes: 5.7 - 6.4          Diabetes: >6.4          Glycemic control for adults with diabetes: <7.0    Est. average glucose Bld gHb Est-mCnc 148 mg/dL  TSH     Status: None   Collection Time: 05/05/22  9:20 AM  Result Value Ref Range   TSH 2.750 0.450 - 4.500 uIU/mL  CMP14+EGFR     Status: Abnormal   Collection Time: 05/05/22  9:20 AM  Result Value Ref Range   Glucose 75 70 - 99 mg/dL   BUN 25 8 - 27 mg/dL   Creatinine, Ser 1.01 0.76 - 1.27 mg/dL   eGFR 77 >59 mL/min/1.73   BUN/Creatinine Ratio 25 (H) 10 - 24   Sodium 144 134 - 144 mmol/L   Potassium 4.7 3.5 - 5.2 mmol/L   Chloride 110 (H) 96 - 106 mmol/L   CO2 20 20 - 29 mmol/L   Calcium 9.2 8.6 - 10.2 mg/dL   Total Protein 6.7 6.0 - 8.5 g/dL   Albumin 4.3 3.8 - 4.8 g/dL   Globulin, Total 2.4 1.5 - 4.5 g/dL   Albumin/Globulin Ratio 1.8 1.2 - 2.2   Bilirubin Total 0.3 0.0 - 1.2 mg/dL   Alkaline Phosphatase 49 44 - 121 IU/L   AST 19 0 - 40 IU/L   ALT 13 0 - 44 IU/L  Lipid panel     Status: Abnormal   Collection Time: 05/05/22  9:20 AM  Result Value Ref Range   Cholesterol, Total 120 100 - 199 mg/dL   Triglycerides 40 0 - 149 mg/dL   HDL 38 (L) >39 mg/dL   VLDL Cholesterol Cal 10 5 - 40 mg/dL   LDL Chol Calc (NIH) 72 0 - 99 mg/dL   Chol/HDL Ratio 3.2 0.0 - 5.0 ratio    Comment:                                    T. Chol/HDL Ratio                                             Men  Women                               1/2 Avg.Risk  3.4    3.3                                   Avg.Risk  5.0    4.4                                2X Avg.Risk  9.6    7.1  3X Avg.Risk 23.4   11.0   CBC With Differential     Status: Abnormal   Collection Time: 05/05/22  9:20 AM  Result Value Ref Range   WBC 5.4 3.4 - 10.8 x10E3/uL   RBC 4.04 (L) 4.14 - 5.80 x10E6/uL   Hemoglobin 11.6 (L) 13.0 - 17.7 g/dL   Hematocrit 37.2 (L) 37.5 - 51.0 %   MCV 92 79 - 97 fL   MCH 28.7 26.6 - 33.0 pg   MCHC 31.2 (L) 31.5 - 35.7 g/dL   RDW 12.6 11.6 - 15.4 %   Neutrophils 69 Not Estab. %   Lymphs 19 Not Estab. %   Monocytes 10 Not Estab. %   Eos 2 Not Estab. %   Basos 0 Not Estab. %   Neutrophils Absolute 3.7 1.4 - 7.0 x10E3/uL   Lymphocytes Absolute 1.0 0.7 - 3.1 x10E3/uL   Monocytes Absolute 0.5 0.1 - 0.9 x10E3/uL   EOS (ABSOLUTE) 0.1 0.0 - 0.4 x10E3/uL   Basophils Absolute 0.0 0.0 - 0.2 x10E3/uL   Immature Granulocytes 0 Not Estab. %   Immature Grans (Abs) 0.0 0.0 - 0.1 x10E3/uL  POCT CBG (Fasting - Glucose)     Status: Abnormal   Collection Time: 05/08/22  9:51 AM  Result Value Ref Range   Glucose Fasting, POC 162 (A) 70 - 99 mg/dL      Assessment & Plan:  Patient advised to continue taking all his medications as such.   Problem List Items Addressed This Visit     Essential hypertension   Hyperlipidemia   Diabetes ([place]) - Primary   Relevant Orders   POCT CBG (Fasting - Glucose) (Completed)   COPD (chronic obstructive pulmonary disease) ([place])   Coronary disease   Osteoarthritis    Return in about 4 months (around 09/07/2022).   Total time spent: 30 minutes  Perrin Maltese, MD  05/08/2022

## 2022-05-15 ENCOUNTER — Ambulatory Visit (INDEPENDENT_AMBULATORY_CARE_PROVIDER_SITE_OTHER): Payer: Medicare Other | Admitting: Cardiovascular Disease

## 2022-05-15 ENCOUNTER — Encounter: Payer: Self-pay | Admitting: Cardiovascular Disease

## 2022-05-15 VITALS — BP 122/68 | HR 58 | Ht 71.5 in | Wt 181.8 lb

## 2022-05-15 DIAGNOSIS — I10 Essential (primary) hypertension: Secondary | ICD-10-CM

## 2022-05-15 DIAGNOSIS — I25119 Atherosclerotic heart disease of native coronary artery with unspecified angina pectoris: Secondary | ICD-10-CM

## 2022-05-15 DIAGNOSIS — I50812 Chronic right heart failure: Secondary | ICD-10-CM | POA: Diagnosis not present

## 2022-05-15 DIAGNOSIS — I6523 Occlusion and stenosis of bilateral carotid arteries: Secondary | ICD-10-CM | POA: Diagnosis not present

## 2022-05-15 DIAGNOSIS — E782 Mixed hyperlipidemia: Secondary | ICD-10-CM | POA: Diagnosis not present

## 2022-05-15 NOTE — Progress Notes (Signed)
Cardiology Office Note   Date:  05/15/2022   ID:  Erik Collins, DOB 1945/07/31, MRN [id number]  PCP:  Perrin Maltese, MD  Cardiologist:  Neoma Laming, MD      History of Present Illness: Erik Collins is a 77 y.o. male who presents for  Chief Complaint  Patient presents with   Follow-up    Follow up    Doing well      Past Medical History:  Diagnosis Date   Arthritis    osteoarthritis   Cancer ([place])    hx lung cancer   CHF (congestive heart failure) ([place])    Complication of anesthesia    knee surgery caused arrythmia requiring a temporary pacemaker   COPD (chronic obstructive pulmonary disease) ([place])    Coronary artery disease    s/p CABG   Diabetes mellitus without complication ([place])    Diverticulosis    Duodenitis    Dysrhythmia    during total knee replacement patient required a temporary pacemaker   GERD (gastroesophageal reflux disease)    Hypercholesterolemia    Hyperplastic colon polyp    Hypertension    Internal hemorrhoids    Lung cancer ([place])    Lung cancer ([place])    Myocardial infarction ([place])    09/2009 f/up with Dr. Neoma Laming   Sleep apnea      Past Surgical History:  Procedure Laterality Date   BACK SURGERY     CATARACT EXTRACTION W/PHACO Left 11/09/2019   Procedure: CATARACT EXTRACTION PHACO AND INTRAOCULAR LENS PLACEMENT (IOC) LEFT DIABETIC MALYUGIN 5.91 01:01.0 9.6%;  Surgeon: Leandrew Koyanagi, MD;  Location: [place];  Service: Ophthalmology;  Laterality: Left;   CATARACT EXTRACTION W/PHACO Right 12/07/2019   Procedure: CATARACT EXTRACTION PHACO AND INTRAOCULAR LENS PLACEMENT (IOC) RIGHT DIABETIC MALYUGIN 6.38 01:05.1 9.8%;  Surgeon: Leandrew Koyanagi, MD;  Location: [place];  Service: Ophthalmology;  Laterality: Right;   COLONOSCOPY N/A 11/23/2019   Procedure: COLONOSCOPY;  Surgeon: Toledo, [name], MD;  Location: [facility] ENDOSCOPY;  Service: Gastroenterology;  Laterality: N/A;   COLONOSCOPY WITH  PROPOFOL N/A 11/09/2014   Procedure: COLONOSCOPY WITH PROPOFOL;  Surgeon: Josefine Class, MD;  Location: [facility] ENDOSCOPY;  Service: Endoscopy;  Laterality: N/A;   CORONARY ANGIOPLASTY     CORONARY ARTERY BYPASS GRAFT  09/2009   quadruple, done at [facility]     ESOPHAGOGASTRODUODENOSCOPY (EGD) WITH PROPOFOL N/A 11/09/2014   Procedure: ESOPHAGOGASTRODUODENOSCOPY (EGD) WITH PROPOFOL;  Surgeon: Josefine Class, MD;  Location: [facility] ENDOSCOPY;  Service: Endoscopy;  Laterality: N/A;   left lower lobectomy  09/2009   at [place] MICRODISCECTOMY  12/17/2011   Procedure: LUMBAR LAMINECTOMY/DECOMPRESSION MICRODISCECTOMY 1 LEVEL;  Surgeon: Eustace Moore, MD;  Location: [place] NEURO ORS;  Service: Neurosurgery;  Laterality: Right;  Right Lumbar five-sacral one microdiscectomy   TEMPORARY PACEMAKER Right 07/10/2017   Procedure: TEMPORARY PACEMAKER;  Surgeon: Isaias Cowman, MD;  Location: [place] CV LAB;  Service: Cardiovascular;  Laterality: Right;   TOTAL KNEE ARTHROPLASTY Left 07/08/2017   Procedure: TOTAL KNEE ARTHROPLASTY;  Surgeon: Earnestine Leys, MD;  Location: [facility] ORS;  Service: Orthopedics;  Laterality: Left;     Current Outpatient Medications  Medication Sig Dispense Refill   acetaminophen (TYLENOL) 650 MG CR tablet Take 1,300 mg by mouth 2 (two) times daily.     aspirin EC 81 MG tablet Take 81 mg by mouth daily.     Bempedoic Acid (NEXLETOL)  180 MG TABS Take 1 tablet (180 mg total) by mouth daily. 90 tablet 0   carvedilol (COREG) 12.5 MG tablet Take 1 tablet (12.5 mg total) by mouth 2 (two) times daily with a meal. 180 tablet 3   empagliflozin (JARDIANCE) 10 MG TABS tablet Take 10 mg by mouth daily.     ezetimibe (ZETIA) 10 MG tablet Take 10 mg by mouth daily.     gabapentin (NEURONTIN) 300 MG capsule      Insulin Glargine 300 UNIT/ML SOPN Inject 0-40 Units into the skin See admin instructions. Sliding scale insulin per  patient  3 units if blood sugar is greater than 150 during the day, if blood sugars are less= no insulin 40 units if blood sugar is greater than 150 at night, if blood sugars are less=no insulin     insulin glargine, 2 Unit Dial, (TOUJEO MAX SOLOSTAR) 300 UNIT/ML Solostar Pen Inject 40 Units into the skin at bedtime. 90 mL 1   losartan (COZAAR) 50 MG tablet TAKE 1 TABLET BY MOUTH DAILY 30 tablet 3   meclizine (ANTIVERT) 25 MG tablet Take 1 tablet (25 mg total) by mouth 3 (three) times daily as needed for dizziness. 15 tablet 0   metFORMIN (GLUCOPHAGE) 1000 MG tablet Take 1,000 mg by mouth 2 (two) times daily.     Omega-3 Fatty Acids (FISH OIL) 1200 MG CAPS Take 1,200 mg by mouth 2 (two) times daily.     omeprazole (PRILOSEC) 20 MG capsule Take 1 capsule (20 mg total) by mouth daily. 90 capsule 0   vitamin B-12 (CYANOCOBALAMIN) 500 MCG tablet Take 500 mcg by mouth daily.     famotidine (PEPCID) 20 MG tablet Take 1 tablet (20 mg total) by mouth 2 (two) times daily. (Patient not taking: Reported on 05/15/2022) 60 tablet 1   meloxicam (MOBIC) 15 MG tablet  (Patient not taking: Reported on 05/15/2022)     No current facility-administered medications for this visit.    Allergies:   Spironolactone and Statins    Social History:   reports that he quit smoking about 12 years ago. His smoking use included cigarettes. He has never used smokeless tobacco. He reports that he does not drink alcohol and does not use drugs.   Family History:  family history includes Diabetes in his mother; Heart attack in his father; Heart disease in his father and mother; Stroke in his father.    ROS:     Review of Systems  Constitutional: Negative.   HENT: Negative.    Eyes: Negative.   Respiratory: Negative.    Gastrointestinal: Negative.   Genitourinary: Negative.   Musculoskeletal: Negative.   Skin: Negative.   Neurological: Negative.   Endo/Heme/Allergies: Negative.   Psychiatric/Behavioral: Negative.    All  other systems reviewed and are negative.     All other systems are reviewed and negative.    PHYSICAL EXAM: VS:  BP 122/68   Pulse (!) 58   Ht 5' 11.5" (1.816 m)   Wt 181 lb 12.8 oz (82.5 kg)   SpO2 96%   BMI 25.00 kg/m  , BMI Body mass index is 25 kg/m. Last weight:  Wt Readings from Last 3 Encounters:  05/15/22 181 lb 12.8 oz (82.5 kg)  05/08/22 181 lb 6.4 oz (82.3 kg)  01/29/22 180 lb 12.8 oz (82 kg)     Physical Exam Vitals reviewed.  Constitutional:      Appearance: Normal appearance. He is normal weight.  HENT:  Head: Normocephalic.     Nose: Nose normal.     Mouth/Throat:     Mouth: Mucous membranes are moist.  Eyes:     Pupils: Pupils are equal, round, and reactive to light.  Cardiovascular:     Rate and Rhythm: Normal rate and regular rhythm.     Pulses: Normal pulses.     Heart sounds: Normal heart sounds.  Pulmonary:     Effort: Pulmonary effort is normal.  Abdominal:     General: Abdomen is flat. Bowel sounds are normal.  Musculoskeletal:        General: Normal range of motion.     Cervical back: Normal range of motion.  Skin:    General: Skin is warm.  Neurological:     General: No focal deficit present.     Mental Status: He is alert.  Psychiatric:        Mood and Affect: Mood normal.       EKG:   Recent Labs: 05/05/2022: ALT 13; BUN 25; Creatinine, Ser 1.01; Hemoglobin 11.6; Potassium 4.7; Sodium 144; TSH 2.750    Lipid Panel    Component Value Date/Time   CHOL 120 05/05/2022 0920   TRIG 40 05/05/2022 0920   HDL 38 (L) 05/05/2022 0920   CHOLHDL 3.2 05/05/2022 0920   LDLCALC 72 05/05/2022 0920      Other studies Reviewed: Additional studies/ records that were reviewed today include:  Review of the above records demonstrates:       No data to display            ASSESSMENT AND PLAN:    ICD-10-CM   1. Coronary artery disease involving native coronary artery of native heart with angina pectoris ([place])  I25.119     doing well    2. Bilateral carotid artery stenosis  I65.23     3. Chronic right-sided congestive heart failure (HCC)  I50.812     4. Essential hypertension  I10     5. Mixed hyperlipidemia  E78.2    hdl 38, ldl 72       Problem List Items Addressed This Visit       Cardiovascular and Mediastinum   Carotid stenosis   Essential hypertension   Congestive heart failure (HCC)   Coronary disease - Primary     Other   Hyperlipidemia       Disposition:   Return in about 3 months (around 08/15/2022).    Total time spent: 30 minutes  Signed,  Neoma Laming, MD  05/15/2022 9:57 AM    [place]

## 2022-05-28 ENCOUNTER — Telehealth: Payer: Self-pay

## 2022-05-28 NOTE — Telephone Encounter (Signed)
Diabetes (DM) Review Call  Erik Collins,Erik Collins  76 years, Male  DOB: December 25, 1945  M:   __________________________________________________ Diabetes (DM) Review (HC) Chart Review A1C Reading #1 (last): 6.8%  on: 05/05/2022 A1C Reading #2: 6.9% on: 05/05/2018 When was patient's last eye exam?: Not in EMR- Overdue The patient's glycemic control has: Remained Stable What recent interventions have been made by any provider to improve the patient's conditions in the last 3 months?: Continue current medications, continue watching diet. Has there been any documented recent hospitalizations or ED visits since last visit with Clinical Lead?: No Is the patient currently on a STATIN medication?: Yes Is the patient currently on ACE/ARB medication?: Yes Most recent Microalbumin / Creatinine ratio (MACR): Cannot locate in EMR Adherence Review Does the [facility] have access to medication refill data?: No Disease State Questions Able to connect with the Patient?: Yes Are you currently checking your blood sugars?: Yes How often are you checking your blood sugar?: daily Recent fasting blood sugars: 99 Recent pre-meal blood sugars: has not taken Recent post-meal blood sugars: 150-157 Recent bedtime blood sugars: 99 Is the patient having any lows <70?: No Is the patient having any fasting blood sugars above >170?: No Is the patient checking their feet daily/regularly?: Yes Are there any cuts, swelling, blisters, redness, or any signs of infections?: No When was your last dentist appointment? (6 Month recommendation): Has dentures What diet changes have you made to improve your Blood Sugar level?: other Details: None What exercise are you doing to improve your Blood Sugar level?: other Details: Works daily- on feet all day Engagement Notes Lynann Bologna on 05/28/2022 04:25 PM reviewed - dimock, eugenia on 05/28/2022 02:53 PM CMCS patient time: HC review call: 30 minutes (05/28/22)  Clinical Lead  Review Review Adherence gaps identified?: No Drug Therapy Problems identified?: No Assessment: Controlled Other notes: Patient states he is using his Insulin as prescribed. He follows a sliding scale. No concerns or questions at this time.

## 2022-06-12 ENCOUNTER — Other Ambulatory Visit: Payer: Self-pay | Admitting: Cardiovascular Disease

## 2022-06-23 ENCOUNTER — Other Ambulatory Visit: Payer: Self-pay | Admitting: Family

## 2022-07-30 ENCOUNTER — Ambulatory Visit
Admission: RE | Admit: 2022-07-30 | Discharge: 2022-07-30 | Disposition: A | Discharge status: Home / Self Care | Payer: Medicare Other | Source: Ambulatory Visit | Attending: Radiation Oncology | Admitting: Radiation Oncology

## 2022-07-30 DIAGNOSIS — R911 Solitary pulmonary nodule: Secondary | ICD-10-CM | POA: Insufficient documentation

## 2022-07-30 DIAGNOSIS — C3490 Malignant neoplasm of unspecified part of unspecified bronchus or lung: Secondary | ICD-10-CM | POA: Diagnosis not present

## 2022-07-30 LAB — POCT I-STAT CREATININE: Creatinine, Ser: 1.2 mg/dL (ref 0.61–1.24)

## 2022-07-30 MED ORDER — IOHEXOL 300 MG/ML  SOLN
75.0000 mL | Freq: Once | INTRAMUSCULAR | Status: AC | PRN
  Administered 2022-07-30: 75 mL via INTRAVENOUS

## 2022-08-06 ENCOUNTER — Ambulatory Visit
Admission: RE | Admit: 2022-08-06 | Discharge: 2022-08-06 | Disposition: A | Discharge status: Home / Self Care | Payer: Medicare Other | Source: Ambulatory Visit | Attending: Radiation Oncology | Admitting: Radiation Oncology

## 2022-08-06 ENCOUNTER — Encounter: Payer: Self-pay | Admitting: Radiation Oncology

## 2022-08-06 VITALS — BP 149/65 | HR 53 | Temp 96.3°F | Resp 18 | Ht 71.5 in | Wt 185.1 lb

## 2022-08-06 DIAGNOSIS — R918 Other nonspecific abnormal finding of lung field: Secondary | ICD-10-CM | POA: Insufficient documentation

## 2022-08-06 DIAGNOSIS — R911 Solitary pulmonary nodule: Secondary | ICD-10-CM

## 2022-08-06 DIAGNOSIS — C44311 Basal cell carcinoma of skin of nose: Secondary | ICD-10-CM | POA: Diagnosis not present

## 2022-08-06 DIAGNOSIS — C3411 Malignant neoplasm of upper lobe, right bronchus or lung: Secondary | ICD-10-CM | POA: Diagnosis not present

## 2022-08-06 DIAGNOSIS — Z923 Personal history of irradiation: Secondary | ICD-10-CM | POA: Diagnosis not present

## 2022-08-06 DIAGNOSIS — Z87891 Personal history of nicotine dependence: Secondary | ICD-10-CM | POA: Diagnosis not present

## 2022-08-06 DIAGNOSIS — Z85118 Personal history of other malignant neoplasm of bronchus and lung: Secondary | ICD-10-CM | POA: Diagnosis not present

## 2022-08-06 NOTE — Progress Notes (Signed)
Radiation Oncology Follow up Note  Name: Erik Collins   Date:   08/06/2022 MRN:  [id number] DOB: 03/19/1945    This 77 y.o. male presents to the clinic today for 59-month follow-up status post SBRT to his right upper lobe for stage I non-small cell lung cancer.  Patient also had a left lower lobectomy and CABG at [facility] in 2011.  REFERRING PROVIDER: Margaretann Loveless, MD  HPI: Patient is a 77 year old male now out 10 months having completed SBRT to his right upper lobe for stage I non-small cell lung cancer.  Seen today in routine follow-up he is doing well specifically denies cough hemoptysis chest tightness or any change in his pulmonary status.  He had a recent CT scan.  Which I have reviewed showing stable radiation changes involving the right upper lobe.  There is also stable surgical changes from left lower lobectomy.  There is a new 1.1 cm nodule lesion left lower lobe.  Recommended short-term follow-up.  COMPLICATIONS OF TREATMENT: none  FOLLOW UP COMPLIANCE: keeps appointments   PHYSICAL EXAM:  BP (!) 149/65   Pulse (!) 53   Temp (!) 96.3 F (35.7 C)   Resp 18   Ht 5' 11.5" (1.816 m)   Wt 185 lb 1.6 oz (84 kg)   BMI 25.46 kg/m patient is a small lesion on his nose with may be an early skin cancer Well-developed well-nourished patient in NAD. HEENT reveals PERLA, EOMI, discs not visualized.  Oral cavity is clear. No oral mucosal lesions are identified. Neck is clear without evidence of cervical or supraclavicular adenopathy. Lungs are clear to A&P. Cardiac examination is essentially unremarkable with regular rate and rhythm without murmur rub or thrill. Abdomen is benign with no organomegaly or masses noted. Motor sensory and DTR levels are equal and symmetric in the upper and lower extremities. Cranial nerves II through XII are grossly intact. Proprioception is intact. No peripheral adenopathy or edema is identified. No motor or sensory levels are noted. Crude visual fields are within  normal range.  RADIOLOGY RESULTS: CT scans reviewed compatible with above-stated findings  PLAN: Present time patient is doing well.  I will repeat CT scans and follow-up in 4 months to keep an eye on the new left lower lobe lesion.  Also asked him to make an appointment with dermatology for evaluation of his nasal ulceration.  Patient is to call with any concerns.  I would like to take this opportunity to thank you for allowing me to participate in the care of your patient.Carmina Miller, MD

## 2022-08-07 ENCOUNTER — Other Ambulatory Visit: Payer: Self-pay | Admitting: Cardiovascular Disease

## 2022-08-12 ENCOUNTER — Encounter: Payer: Self-pay | Admitting: Dermatology

## 2022-08-12 ENCOUNTER — Ambulatory Visit (INDEPENDENT_AMBULATORY_CARE_PROVIDER_SITE_OTHER): Payer: Medicare Other | Admitting: Dermatology

## 2022-08-12 VITALS — BP 122/56 | HR 60

## 2022-08-12 DIAGNOSIS — L711 Rhinophyma: Secondary | ICD-10-CM

## 2022-08-12 DIAGNOSIS — L719 Rosacea, unspecified: Secondary | ICD-10-CM

## 2022-08-12 DIAGNOSIS — L578 Other skin changes due to chronic exposure to nonionizing radiation: Secondary | ICD-10-CM

## 2022-08-12 DIAGNOSIS — H47011 Ischemic optic neuropathy, right eye: Secondary | ICD-10-CM | POA: Diagnosis not present

## 2022-08-12 DIAGNOSIS — W908XXA Exposure to other nonionizing radiation, initial encounter: Secondary | ICD-10-CM | POA: Diagnosis not present

## 2022-08-12 DIAGNOSIS — C44311 Basal cell carcinoma of skin of nose: Secondary | ICD-10-CM | POA: Diagnosis not present

## 2022-08-12 DIAGNOSIS — D485 Neoplasm of uncertain behavior of skin: Secondary | ICD-10-CM

## 2022-08-12 DIAGNOSIS — H35371 Puckering of macula, right eye: Secondary | ICD-10-CM | POA: Diagnosis not present

## 2022-08-12 DIAGNOSIS — Z961 Presence of intraocular lens: Secondary | ICD-10-CM | POA: Diagnosis not present

## 2022-08-12 DIAGNOSIS — E119 Type 2 diabetes mellitus without complications: Secondary | ICD-10-CM | POA: Diagnosis not present

## 2022-08-12 DIAGNOSIS — Z7189 Other specified counseling: Secondary | ICD-10-CM

## 2022-08-12 DIAGNOSIS — C4491 Basal cell carcinoma of skin, unspecified: Secondary | ICD-10-CM

## 2022-08-12 HISTORY — DX: Basal cell carcinoma of skin, unspecified: C44.91

## 2022-08-12 NOTE — Progress Notes (Signed)
   New Patient Visit   Subjective  Erik Collins is a 77 y.o. male who presents for the following: lesion on the R nose x 2-3 years, crusted, doesn't resolve. The patient has spots, moles and lesions to be evaluated, some may be new or changing and the patient may have concern these could be cancer.  The following portions of the chart were reviewed this encounter and updated as appropriate: medications, allergies, medical history  Review of Systems:  No other skin or systemic complaints except as noted in HPI or Assessment and Plan.  Objective  Well appearing patient in no apparent distress; mood and affect are within normal limits. A focused examination was performed of the following areas: the face Relevant exam findings are noted in the Assessment and Plan.  R nose lat to nasal ala 1.0 cm crusted papule.      Assessment & Plan   Neoplasm of uncertain behavior of skin R nose lat to nasal ala  Skin / nail biopsy Type of biopsy: tangential   Informed consent: discussed and consent obtained   Timeout: patient name, date of birth, surgical site, and procedure verified   Procedure prep:  Patient was prepped and draped in usual sterile fashion Prep type:  Isopropyl alcohol Anesthesia: the lesion was anesthetized in a standard fashion   Anesthetic:  1% lidocaine w/ epinephrine 1-100,000 buffered w/ 8.4% NaHCO3 Instrument used: flexible razor blade   Hemostasis achieved with: pressure, aluminum chloride and electrodesiccation   Outcome: patient tolerated procedure well   Post-procedure details: sterile dressing applied and wound care instructions given   Dressing type: bandage and petrolatum    Specimen 1 - Surgical pathology Differential Diagnosis: D48.5 r/o BCC Check Margins: No  Discussed treatment options of Mohs vs radiation. Recommend radiation treatment.  ACTINIC DAMAGE - chronic, secondary to cumulative UV radiation exposure/sun exposure over time - diffuse scaly  erythematous macules with underlying dyspigmentation - Recommend daily broad spectrum sunscreen SPF 30+ to sun-exposed areas, reapply every 2 hours as needed.  - Recommend staying in the shade or wearing long sleeves, sun glasses (UVA+UVB protection) and wide brim hats (4-inch brim around the entire circumference of the hat). - Call for new or changing lesions.  ROSACEA with rhinophyma  Exam Thickening of the skin on the nose  Rosacea is a chronic progressive skin condition usually affecting the face of adults, causing redness and/or acne bumps. It is treatable but not curable. It sometimes affects the eyes (ocular rosacea) as well. It may respond to topical and/or systemic medication and can flare with stress, sun exposure, alcohol, exercise, topical steroids (including hydrocortisone/cortisone 10) and some foods.  Daily application of broad spectrum spf 30+ sunscreen to face is recommended to reduce flares.  Treatment Plan Benign, no tx needed.  Return in about 6 months (around 02/11/2023) for TBSE.  Maylene Roes, CMA, am acting as scribe for Armida Sans, MD .  Documentation: I have reviewed the above documentation for accuracy and completeness, and I agree with the above.  Armida Sans, MD

## 2022-08-12 NOTE — Patient Instructions (Addendum)
Wound Care Instructions  Cleanse wound gently with soap and water once a day then pat dry with clean gauze. Apply a thin coat of Petrolatum (petroleum jelly, "Vaseline") over the wound (unless you have an allergy to this). We recommend that you use a new, sterile tube of Vaseline. Do not pick or remove scabs. Do not remove the yellow or white "healing tissue" from the base of the wound.  Cover the wound with fresh, clean, nonstick gauze and secure with paper tape. You may use Band-Aids in place of gauze and tape if the wound is small enough, but would recommend trimming much of the tape off as there is often too much. Sometimes Band-Aids can irritate the skin.  You should call the office for your biopsy report after 1 week if you have not already been contacted.  If you experience any problems, such as abnormal amounts of bleeding, swelling, significant bruising, significant pain, or evidence of infection, please call the office immediately.  FOR ADULT SURGERY PATIENTS: If you need something for pain relief you may take 1 extra strength Tylenol (acetaminophen) AND 2 Ibuprofen (200mg each) together every 4 hours as needed for pain. (do not take these if you are allergic to them or if you have a reason you should not take them.) Typically, you may only need pain medication for 1 to 3 days.     Due to recent changes in healthcare laws, you may see results of your pathology and/or laboratory studies on MyChart before the doctors have had a chance to review them. We understand that in some cases there may be results that are confusing or concerning to you. Please understand that not all results are received at the same time and often the doctors may need to interpret multiple results in order to provide you with the best plan of care or course of treatment. Therefore, we ask that you please give us 2 business days to thoroughly review all your results before contacting the office for clarification. Should  we see a critical lab result, you will be contacted sooner.   If You Need Anything After Your Visit  If you have any questions or concerns for your doctor, please call our main line at [phone] and press option 4 to reach your doctor's medical assistant. If no one answers, please leave a voicemail as directed and we will return your call as soon as possible. Messages left after 4 pm will be answered the following business day.   You may also send us a message via MyChart. We typically respond to MyChart messages within 1-2 business days.  For prescription refills, please ask your pharmacy to contact our office. Our fax number is [phone].  If you have an urgent issue when the clinic is closed that cannot wait until the next business day, you can page your doctor at the number below.    Please note that while we do our best to be available for urgent issues outside of office hours, we are not available 24/7.   If you have an urgent issue and are unable to reach us, you may choose to seek medical care at your doctor's office, retail clinic, urgent care center, or emergency room.  If you have a medical emergency, please immediately call 911 or go to the emergency department.  Pager Numbers  - Dr. Kowalski: [phone]  - Dr. Moye: [phone]  - Dr. Stewart: [phone]  In the event of inclement weather, please call our main line at   [phone] for an update on the status of any delays or closures.  Dermatology Medication Tips: Please keep the boxes that topical medications come in in order to help keep track of the instructions about where and how to use these. Pharmacies typically print the medication instructions only on the boxes and not directly on the medication tubes.   If your medication is too expensive, please contact our office at [phone] option 4 or send us a message through MyChart.   We are unable to tell what your co-pay for medications will be in  advance as this is different depending on your insurance coverage. However, we may be able to find a substitute medication at lower cost or fill out paperwork to get insurance to cover a needed medication.   If a prior authorization is required to get your medication covered by your insurance company, please allow us 1-2 business days to complete this process.  Drug prices often vary depending on where the prescription is filled and some pharmacies may offer cheaper prices.  The website www.goodrx.com contains coupons for medications through different pharmacies. The prices here do not account for what the cost may be with help from insurance (it may be cheaper with your insurance), but the website can give you the price if you did not use any insurance.  - You can print the associated coupon and take it with your prescription to the pharmacy.  - You may also stop by our office during regular business hours and pick up a GoodRx coupon card.  - If you need your prescription sent electronically to a different pharmacy, notify our office through [facility] MyChart or by phone at [phone] option 4.     Si Usted Necesita Algo Despus de Su Visita  Tambin puede enviarnos un mensaje a travs de MyChart. Por lo general respondemos a los mensajes de MyChart en el transcurso de 1 a 2 das hbiles.  Para renovar recetas, por favor pida a su farmacia que se ponga en contacto con nuestra oficina. Nuestro nmero de fax es el [phone].  Si tiene un asunto urgente cuando la clnica est cerrada y que no puede esperar hasta el siguiente da hbil, puede llamar/localizar a su doctor(a) al nmero que aparece a continuacin.   Por favor, tenga en cuenta que aunque hacemos todo lo posible para estar disponibles para asuntos urgentes fuera del horario de oficina, no estamos disponibles las 24 horas del da, los 7 das de la semana.   Si tiene un problema urgente y no puede comunicarse con nosotros, puede  optar por buscar atencin mdica  en el consultorio de su doctor(a), en una clnica privada, en un centro de atencin urgente o en una sala de emergencias.  Si tiene una emergencia mdica, por favor llame inmediatamente al 911 o vaya a la sala de emergencias.  Nmeros de bper  - Dr. Kowalski: [phone]  - Dra. Moye: [phone]  - Dra. Stewart: [phone]  En caso de inclemencias del tiempo, por favor llame a nuestra lnea principal al [phone] para una actualizacin sobre el estado de cualquier retraso o cierre.  Consejos para la medicacin en dermatologa: Por favor, guarde las cajas en las que vienen los medicamentos de uso tpico para ayudarle a seguir las instrucciones sobre dnde y cmo usarlos. Las farmacias generalmente imprimen las instrucciones del medicamento slo en las cajas y no directamente en los tubos del medicamento.   Si su medicamento es muy caro, por favor, pngase en contacto con   nuestra oficina llamando al [phone] y presione la opcin 4 o envenos un mensaje a travs de MyChart.   No podemos decirle cul ser su copago por los medicamentos por adelantado ya que esto es diferente dependiendo de la cobertura de su seguro. Sin embargo, es posible que podamos encontrar un medicamento sustituto a menor costo o llenar un formulario para que el seguro cubra el medicamento que se considera necesario.   Si se requiere una autorizacin previa para que su compaa de seguros cubra su medicamento, por favor permtanos de 1 a 2 das hbiles para completar este proceso.  Los precios de los medicamentos varan con frecuencia dependiendo del lugar de dnde se surte la receta y alguna farmacias pueden ofrecer precios ms baratos.  El sitio web www.goodrx.com tiene cupones para medicamentos de diferentes farmacias. Los precios aqu no tienen en cuenta lo que podra costar con la ayuda del seguro (puede ser ms barato con su seguro), pero el sitio web puede darle el  precio si no utiliz ningn seguro.  - Puede imprimir el cupn correspondiente y llevarlo con su receta a la farmacia.  - Tambin puede pasar por nuestra oficina durante el horario de atencin regular y recoger una tarjeta de cupones de GoodRx.  - Si necesita que su receta se enve electrnicamente a una farmacia diferente, informe a nuestra oficina a travs de MyChart de [facility] o por telfono llamando al [phone] y presione la opcin 4.  

## 2022-08-14 ENCOUNTER — Telehealth: Payer: Self-pay

## 2022-08-14 DIAGNOSIS — C44311 Basal cell carcinoma of skin of nose: Secondary | ICD-10-CM

## 2022-08-14 NOTE — Telephone Encounter (Signed)
-----   Message from Deirdre Evener, MD sent at 08/13/2022  5:20 PM EDT ----- Diagnosis Skin , right nose lat to nasal ala BASAL CELL CARCINOMA, NODULAR PATTERN  LOCATION SHOULD BE CHANGED TO : RIGHT NOSE LATERAL SUPRATIP Cancer = BCC Schedule for Radiation at [facility]

## 2022-08-14 NOTE — Telephone Encounter (Signed)
Discussed pathology results with patient and referral sent to [facility] radiation oncology.

## 2022-08-15 ENCOUNTER — Ambulatory Visit (INDEPENDENT_AMBULATORY_CARE_PROVIDER_SITE_OTHER): Payer: Medicare Other | Admitting: Cardiovascular Disease

## 2022-08-15 ENCOUNTER — Encounter: Payer: Self-pay | Admitting: Cardiovascular Disease

## 2022-08-15 VITALS — BP 116/64 | HR 68 | Ht 71.0 in | Wt 184.0 lb

## 2022-08-15 DIAGNOSIS — E782 Mixed hyperlipidemia: Secondary | ICD-10-CM

## 2022-08-15 DIAGNOSIS — I25119 Atherosclerotic heart disease of native coronary artery with unspecified angina pectoris: Secondary | ICD-10-CM

## 2022-08-15 DIAGNOSIS — I10 Essential (primary) hypertension: Secondary | ICD-10-CM | POA: Diagnosis not present

## 2022-08-15 DIAGNOSIS — I6523 Occlusion and stenosis of bilateral carotid arteries: Secondary | ICD-10-CM | POA: Diagnosis not present

## 2022-08-15 DIAGNOSIS — I50812 Chronic right heart failure: Secondary | ICD-10-CM

## 2022-08-15 NOTE — Progress Notes (Signed)
Cardiology Office Note   Date:  08/15/2022   ID:  Erik, Collins [birth year], MRN [id number]  PCP:  Margaretann Loveless, MD  Cardiologist:  Adrian Blackwater, MD      History of Present Illness: Erik Collins is a 77 y.o. male who presents for  Chief Complaint  Patient presents with   Follow-up    3 months follow up    Has nodule in lungs, and nose. No chest pain or SOB      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   R nose lateral supratip - referred for radiation oncology   Cancer (HCC)    hx lung cancer   CHF (congestive heart failure) (HCC)    Complication of anesthesia    knee surgery caused arrythmia requiring a temporary pacemaker   COPD (chronic obstructive pulmonary disease) (HCC)    Coronary artery disease    s/p CABG   Diabetes mellitus without complication (HCC)    Diverticulosis    Duodenitis    Dysrhythmia    during total knee replacement patient required a temporary pacemaker   GERD (gastroesophageal reflux disease)    Hypercholesterolemia    Hyperplastic colon polyp    Hypertension    Internal hemorrhoids    Lung cancer (HCC)    Lung cancer (HCC)    Myocardial infarction (HCC)    09/2009 f/up with Dr. Adrian Blackwater   Sleep apnea      Past Surgical History:  Procedure Laterality Date   BACK SURGERY     CATARACT EXTRACTION W/PHACO Left 11/09/2019   Procedure: CATARACT EXTRACTION PHACO AND INTRAOCULAR LENS PLACEMENT (IOC) LEFT DIABETIC MALYUGIN 5.91 01:01.0 9.6%;  Surgeon: Lockie Mola, MD;  Location: [facility];  Service: Ophthalmology;  Laterality: Left;   CATARACT EXTRACTION W/PHACO Right 12/07/2019   Procedure: CATARACT EXTRACTION PHACO AND INTRAOCULAR LENS PLACEMENT (IOC) RIGHT DIABETIC MALYUGIN 6.38 01:05.1 9.8%;  Surgeon: Lockie Mola, MD;  Location: [facility];  Service: Ophthalmology;  Laterality: Right;   COLONOSCOPY N/A 11/23/2019   Procedure:  COLONOSCOPY;  Surgeon: Toledo, Boykin Nearing, MD;  Location: [facility] ENDOSCOPY;  Service: Gastroenterology;  Laterality: N/A;   COLONOSCOPY WITH PROPOFOL N/A 11/09/2014   Procedure: COLONOSCOPY WITH PROPOFOL;  Surgeon: Elnita Maxwell, MD;  Location: [facility] ENDOSCOPY;  Service: Endoscopy;  Laterality: N/A;   CORONARY ANGIOPLASTY     CORONARY ARTERY BYPASS GRAFT  09/2009   quadruple, done at [facility]   CORONARY ARTERY BYPASS GRAFT     ESOPHAGOGASTRODUODENOSCOPY (EGD) WITH PROPOFOL N/A 11/09/2014   Procedure: ESOPHAGOGASTRODUODENOSCOPY (EGD) WITH PROPOFOL;  Surgeon: Elnita Maxwell, MD;  Location: [facility] ENDOSCOPY;  Service: Endoscopy;  Laterality: N/A;   left lower lobectomy  09/2009   at [facility] LAMINECTOMY/DECOMPRESSION MICRODISCECTOMY  12/17/2011   Procedure: LUMBAR LAMINECTOMY/DECOMPRESSION MICRODISCECTOMY 1 LEVEL;  Surgeon: Tia Alert, MD;  Location: [facility] NEURO ORS;  Service: Neurosurgery;  Laterality: Right;  Right Lumbar five-sacral one microdiscectomy   TEMPORARY PACEMAKER Right 07/10/2017   Procedure: TEMPORARY PACEMAKER;  Surgeon: Marcina Millard, MD;  Location: [facility] INVASIVE CV LAB;  Service: Cardiovascular;  Laterality: Right;   TOTAL KNEE ARTHROPLASTY Left 07/08/2017   Procedure: TOTAL KNEE ARTHROPLASTY;  Surgeon: Deeann Saint, MD;  Location: [facility] ORS;  Service: Orthopedics;  Laterality: Left;     Current Outpatient Medications  Medication Sig Dispense Refill   acetaminophen (TYLENOL) 650 MG CR tablet Take 1,300 mg  by mouth 2 (two) times daily.     aspirin EC 81 MG tablet Take 81 mg by mouth daily.     carvedilol (COREG) 12.5 MG tablet Take 1 tablet (12.5 mg total) by mouth 2 (two) times daily with a meal. 180 tablet 3   empagliflozin (JARDIANCE) 10 MG TABS tablet Take 10 mg by mouth daily.     ezetimibe (ZETIA) 10 MG tablet TAKE 1 TABLET BY MOUTH DAILY 90 tablet 1   gabapentin (NEURONTIN) 600 MG tablet Take by mouth.     Insulin Glargine 300 UNIT/ML SOPN Inject 0-40 Units  into the skin See admin instructions. Sliding scale insulin per patient  3 units if blood sugar is greater than 150 during the day, if blood sugars are less= no insulin 40 units if blood sugar is greater than 150 at night, if blood sugars are less=no insulin     insulin glargine, 2 Unit Dial, (TOUJEO MAX SOLOSTAR) 300 UNIT/ML Solostar Pen Inject 40 Units into the skin at bedtime. 90 mL 1   losartan (COZAAR) 50 MG tablet TAKE 1 TABLET BY MOUTH DAILY 30 tablet 3   meclizine (ANTIVERT) 25 MG tablet Take 1 tablet (25 mg total) by mouth 3 (three) times daily as needed for dizziness. 15 tablet 0   metFORMIN (GLUCOPHAGE) 1000 MG tablet Take 1,000 mg by mouth 2 (two) times daily.     NEXLETOL 180 MG TABS TAKE 1 TABLET BY MOUTH DAILY 90 tablet 0   Omega-3 Fatty Acids (FISH OIL) 1200 MG CAPS Take 1,200 mg by mouth 2 (two) times daily.     omeprazole (PRILOSEC) 20 MG capsule Take 1 capsule (20 mg total) by mouth daily. 90 capsule 0   vitamin B-12 (CYANOCOBALAMIN) 500 MCG tablet Take 500 mcg by mouth daily.     No current facility-administered medications for this visit.    Allergies:   Spironolactone and Statins    Social History:   reports that he quit smoking about 12 years ago. His smoking use included cigarettes. He has never used smokeless tobacco. He reports that he does not drink alcohol and does not use drugs.   Family History:  family history includes Diabetes in his mother; Heart attack in his father; Heart disease in his father and mother; Stroke in his father.    ROS:     Review of Systems  Constitutional: Negative.   HENT: Negative.    Eyes: Negative.   Respiratory: Negative.    Gastrointestinal: Negative.   Genitourinary: Negative.   Musculoskeletal: Negative.   Skin: Negative.   Neurological: Negative.   Endo/Heme/Allergies: Negative.   Psychiatric/Behavioral: Negative.    All other systems reviewed and are negative.     All other systems are reviewed and negative.     PHYSICAL EXAM: VS:  BP 116/64   Pulse 68   Ht 5\' 11"  (1.803 m)   Wt 184 lb (83.5 kg)   SpO2 92%   BMI 25.66 kg/m  , BMI Body mass index is 25.66 kg/m. Last weight:  Wt Readings from Last 3 Encounters:  08/15/22 184 lb (83.5 kg)  08/06/22 185 lb 1.6 oz (84 kg)  05/15/22 181 lb 12.8 oz (82.5 kg)     Physical Exam Vitals reviewed.  Constitutional:      Appearance: Normal appearance. He is normal weight.  HENT:     Head: Normocephalic.     Nose: Nose normal.     Mouth/Throat:     Mouth: Mucous membranes are moist.  Eyes:     Pupils: Pupils are equal, round, and reactive to light.  Cardiovascular:     Rate and Rhythm: Normal rate and regular rhythm.     Pulses: Normal pulses.     Heart sounds: Normal heart sounds.  Pulmonary:     Effort: Pulmonary effort is normal.  Abdominal:     General: Abdomen is flat. Bowel sounds are normal.  Musculoskeletal:        General: Normal range of motion.     Cervical back: Normal range of motion.  Skin:    General: Skin is warm.  Neurological:     General: No focal deficit present.     Mental Status: He is alert.  Psychiatric:        Mood and Affect: Mood normal.       EKG:   Recent Labs: 05/05/2022: ALT 13; BUN 25; Hemoglobin 11.6; Potassium 4.7; Sodium 144; TSH 2.750 07/30/2022: Creatinine, Ser 1.20    Lipid Panel    Component Value Date/Time   CHOL 120 05/05/2022 0920   TRIG 40 05/05/2022 0920   HDL 38 (L) 05/05/2022 0920   CHOLHDL 3.2 05/05/2022 0920   LDLCALC 72 05/05/2022 0920      Other studies Reviewed: Additional studies/ records that were reviewed today include:  Review of the above records demonstrates:       No data to display            ASSESSMENT AND PLAN:    ICD-10-CM   1. Bilateral carotid artery stenosis  I65.23     2. Chronic right-sided congestive heart failure (HCC)  I50.812     3. Coronary artery disease involving native coronary artery of native heart with angina pectoris  (HCC)  I25.119    stable no chest pain    4. Essential hypertension  I10     5. Mixed hyperlipidemia  E78.2        Problem List Items Addressed This Visit       Cardiovascular and Mediastinum   Carotid stenosis - Primary   Essential hypertension   Congestive heart failure (HCC)   Coronary disease     Other   Hyperlipidemia       Disposition:   Return in about 3 months (around 11/15/2022).    Total time spent: 30 minutes  Signed,  Adrian Blackwater, MD  08/15/2022 1:20 PM    [facility]

## 2022-08-20 ENCOUNTER — Ambulatory Visit
Admission: RE | Admit: 2022-08-20 | Discharge: 2022-08-20 | Disposition: A | Discharge status: Home / Self Care | Payer: Medicare Other | Source: Ambulatory Visit | Attending: Radiation Oncology | Admitting: Radiation Oncology

## 2022-08-20 ENCOUNTER — Encounter: Payer: Self-pay | Admitting: Radiation Oncology

## 2022-08-20 VITALS — BP 147/62 | HR 70 | Temp 98.0°F | Resp 16 | Ht 71.0 in | Wt 183.3 lb

## 2022-08-20 DIAGNOSIS — C44311 Basal cell carcinoma of skin of nose: Secondary | ICD-10-CM | POA: Diagnosis not present

## 2022-08-20 DIAGNOSIS — R918 Other nonspecific abnormal finding of lung field: Secondary | ICD-10-CM | POA: Insufficient documentation

## 2022-08-20 DIAGNOSIS — Z85118 Personal history of other malignant neoplasm of bronchus and lung: Secondary | ICD-10-CM | POA: Diagnosis not present

## 2022-08-20 DIAGNOSIS — Z87891 Personal history of nicotine dependence: Secondary | ICD-10-CM | POA: Diagnosis not present

## 2022-08-20 DIAGNOSIS — Z923 Personal history of irradiation: Secondary | ICD-10-CM | POA: Insufficient documentation

## 2022-08-20 NOTE — Progress Notes (Signed)
Radiation Oncology Follow up Note old patient new area basal cell carcinoma of nose  Name: Erik Collins   Date:   08/20/2022 MRN:  [id number] DOB: 04/02/1945    This 77 y.o. male presents to the clinic today for evaluation of a basal cell carcinoma of his nose.  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  He is also had a left lower lobectomy and CABG at [facility] in 2011.[name]  He had a recent CT scan showing stable radiation changes involving the right upper lobe no findings for suspicious for recurrent tumor.  He does have a 11 mm nodule in the left lower lobe for which 81-month follow-up has been recommended.  He recently had a lesion on his right nose biopsy which was basal cell carcinoma nodular pattern and has been referred to radiation oncology for evaluation.  He is asymptomatic.  COMPLICATIONS OF TREATMENT: none  FOLLOW UP COMPLIANCE: keeps appointments   PHYSICAL EXAM:  BP (!) 147/62   Pulse 70   Temp 98 F (36.7 C)   Resp 16   Ht 5\' 11"  (1.803 m)   Wt 183 lb 4.8 oz (83.1 kg)   BMI 25.57 kg/m  Patient has a small erythematous lesion on the right nasal fold.  No evidence of submental or cervical adenopathy is appreciated.  Well-developed well-nourished patient in NAD. HEENT reveals PERLA, EOMI, discs not visualized.  Oral cavity is clear. No oral mucosal lesions are identified. Neck is clear without evidence of cervical or supraclavicular adenopathy. Lungs are clear to A&P. Cardiac examination is essentially unremarkable with regular rate and rhythm without murmur rub or thrill. Abdomen is benign with no organomegaly or masses noted. Motor sensory and DTR levels are equal and symmetric in the upper and lower extremities. Cranial nerves II  through XII are grossly intact. Proprioception is intact. No peripheral adenopathy or edema is identified. No motor or sensory levels are noted. Crude visual fields are within normal range.  RADIOLOGY RESULTS: CT scan of his chest reviewed compatible with above-stated findings  PLAN: Present time patient from a lung standpoint is doing well.  He has a small nodular basal cell carcinoma of the right nose tip.  I have offered 50 Gray in 5 weeks of external beam electron-beam therapy.  Risks and benefits of treatment including slight skin reaction fatigue were reviewed with the patient.  I personally 7 ordered CT simulation for early next week.  Patient comprehends my recommendations well.  I would like to take this opportunity to thank you for allowing me to participate in the care of your patient.Carmina Miller, MD

## 2022-08-25 ENCOUNTER — Ambulatory Visit
Admission: RE | Admit: 2022-08-25 | Discharge: 2022-08-25 | Disposition: A | Discharge status: Home / Self Care | Payer: Medicare Other | Source: Ambulatory Visit | Attending: Radiation Oncology | Admitting: Radiation Oncology

## 2022-08-25 DIAGNOSIS — R918 Other nonspecific abnormal finding of lung field: Secondary | ICD-10-CM | POA: Insufficient documentation

## 2022-08-25 DIAGNOSIS — Z923 Personal history of irradiation: Secondary | ICD-10-CM | POA: Insufficient documentation

## 2022-08-25 DIAGNOSIS — C44311 Basal cell carcinoma of skin of nose: Secondary | ICD-10-CM | POA: Insufficient documentation

## 2022-08-25 DIAGNOSIS — Z87891 Personal history of nicotine dependence: Secondary | ICD-10-CM | POA: Diagnosis not present

## 2022-08-25 DIAGNOSIS — Z85118 Personal history of other malignant neoplasm of bronchus and lung: Secondary | ICD-10-CM | POA: Diagnosis not present

## 2022-08-28 ENCOUNTER — Encounter: Payer: Self-pay | Admitting: Internal Medicine

## 2022-09-01 ENCOUNTER — Ambulatory Visit
Admission: RE | Admit: 2022-09-01 | Discharge: 2022-09-01 | Disposition: A | Discharge status: Home / Self Care | Payer: Medicare Other | Source: Ambulatory Visit | Attending: Radiation Oncology | Admitting: Radiation Oncology

## 2022-09-01 ENCOUNTER — Other Ambulatory Visit: Payer: Self-pay

## 2022-09-01 DIAGNOSIS — C44311 Basal cell carcinoma of skin of nose: Secondary | ICD-10-CM | POA: Diagnosis not present

## 2022-09-01 DIAGNOSIS — Z923 Personal history of irradiation: Secondary | ICD-10-CM | POA: Diagnosis not present

## 2022-09-01 DIAGNOSIS — R918 Other nonspecific abnormal finding of lung field: Secondary | ICD-10-CM | POA: Diagnosis not present

## 2022-09-01 LAB — RAD ONC ARIA SESSION SUMMARY
Course Elapsed Days: 0
Plan Fractions Treated to Date: 1
Plan Prescribed Dose Per Fraction: 2 Gy
Plan Total Fractions Prescribed: 25
Plan Total Prescribed Dose: 50 Gy
Reference Point Dosage Given to Date: 2 Gy
Reference Point Session Dosage Given: 2 Gy
Session Number: 1

## 2022-09-02 ENCOUNTER — Ambulatory Visit
Admission: RE | Admit: 2022-09-02 | Discharge: 2022-09-02 | Disposition: A | Discharge status: Home / Self Care | Payer: Medicare Other | Source: Ambulatory Visit | Attending: Radiation Oncology | Admitting: Radiation Oncology

## 2022-09-02 ENCOUNTER — Other Ambulatory Visit: Payer: Self-pay

## 2022-09-02 DIAGNOSIS — Z923 Personal history of irradiation: Secondary | ICD-10-CM | POA: Diagnosis not present

## 2022-09-02 DIAGNOSIS — C44311 Basal cell carcinoma of skin of nose: Secondary | ICD-10-CM | POA: Diagnosis not present

## 2022-09-02 DIAGNOSIS — R918 Other nonspecific abnormal finding of lung field: Secondary | ICD-10-CM | POA: Diagnosis not present

## 2022-09-02 LAB — RAD ONC ARIA SESSION SUMMARY
Course Elapsed Days: 1
Plan Fractions Treated to Date: 2
Plan Prescribed Dose Per Fraction: 2 Gy
Plan Total Fractions Prescribed: 25
Plan Total Prescribed Dose: 50 Gy
Reference Point Dosage Given to Date: 4 Gy
Reference Point Session Dosage Given: 2 Gy
Session Number: 2

## 2022-09-03 ENCOUNTER — Other Ambulatory Visit: Payer: Self-pay

## 2022-09-03 ENCOUNTER — Ambulatory Visit
Admission: RE | Admit: 2022-09-03 | Discharge: 2022-09-03 | Disposition: A | Discharge status: Home / Self Care | Payer: Medicare Other | Source: Ambulatory Visit | Attending: Radiation Oncology | Admitting: Radiation Oncology

## 2022-09-03 DIAGNOSIS — R918 Other nonspecific abnormal finding of lung field: Secondary | ICD-10-CM | POA: Diagnosis not present

## 2022-09-03 DIAGNOSIS — Z923 Personal history of irradiation: Secondary | ICD-10-CM | POA: Diagnosis not present

## 2022-09-03 DIAGNOSIS — C44311 Basal cell carcinoma of skin of nose: Secondary | ICD-10-CM | POA: Diagnosis not present

## 2022-09-03 LAB — RAD ONC ARIA SESSION SUMMARY
Course Elapsed Days: 2
Plan Fractions Treated to Date: 3
Plan Prescribed Dose Per Fraction: 2 Gy
Plan Total Fractions Prescribed: 25
Plan Total Prescribed Dose: 50 Gy
Reference Point Dosage Given to Date: 6 Gy
Reference Point Session Dosage Given: 2 Gy
Session Number: 3

## 2022-09-04 ENCOUNTER — Ambulatory Visit
Admission: RE | Admit: 2022-09-04 | Discharge: 2022-09-04 | Disposition: A | Discharge status: Home / Self Care | Payer: Medicare Other | Source: Ambulatory Visit | Attending: Radiation Oncology | Admitting: Radiation Oncology

## 2022-09-04 ENCOUNTER — Other Ambulatory Visit: Payer: Self-pay

## 2022-09-04 DIAGNOSIS — R918 Other nonspecific abnormal finding of lung field: Secondary | ICD-10-CM | POA: Diagnosis not present

## 2022-09-04 DIAGNOSIS — C44311 Basal cell carcinoma of skin of nose: Secondary | ICD-10-CM | POA: Diagnosis not present

## 2022-09-04 DIAGNOSIS — Z923 Personal history of irradiation: Secondary | ICD-10-CM | POA: Diagnosis not present

## 2022-09-04 LAB — RAD ONC ARIA SESSION SUMMARY
Course Elapsed Days: 3
Plan Fractions Treated to Date: 4
Plan Prescribed Dose Per Fraction: 2 Gy
Plan Total Fractions Prescribed: 25
Plan Total Prescribed Dose: 50 Gy
Reference Point Dosage Given to Date: 8 Gy
Reference Point Session Dosage Given: 2 Gy
Session Number: 4

## 2022-09-05 ENCOUNTER — Ambulatory Visit: Payer: Medicare Other

## 2022-09-08 ENCOUNTER — Ambulatory Visit
Admission: RE | Admit: 2022-09-08 | Discharge: 2022-09-08 | Disposition: A | Discharge status: Home / Self Care | Payer: Medicare Other | Source: Ambulatory Visit | Attending: Radiation Oncology | Admitting: Radiation Oncology

## 2022-09-08 ENCOUNTER — Other Ambulatory Visit: Payer: Self-pay | Admitting: Cardiovascular Disease

## 2022-09-08 ENCOUNTER — Ambulatory Visit: Payer: Medicare Other | Admitting: Internal Medicine

## 2022-09-08 ENCOUNTER — Other Ambulatory Visit: Payer: Self-pay

## 2022-09-08 DIAGNOSIS — Z87891 Personal history of nicotine dependence: Secondary | ICD-10-CM | POA: Diagnosis not present

## 2022-09-08 DIAGNOSIS — Z85118 Personal history of other malignant neoplasm of bronchus and lung: Secondary | ICD-10-CM | POA: Diagnosis not present

## 2022-09-08 DIAGNOSIS — Z510 Encounter for antineoplastic radiation therapy: Secondary | ICD-10-CM | POA: Diagnosis not present

## 2022-09-08 DIAGNOSIS — Z923 Personal history of irradiation: Secondary | ICD-10-CM | POA: Diagnosis not present

## 2022-09-08 DIAGNOSIS — C44311 Basal cell carcinoma of skin of nose: Secondary | ICD-10-CM | POA: Diagnosis not present

## 2022-09-08 DIAGNOSIS — R918 Other nonspecific abnormal finding of lung field: Secondary | ICD-10-CM | POA: Diagnosis not present

## 2022-09-08 LAB — RAD ONC ARIA SESSION SUMMARY
Course Elapsed Days: 7
Plan Fractions Treated to Date: 5
Plan Prescribed Dose Per Fraction: 2 Gy
Plan Total Fractions Prescribed: 25
Plan Total Prescribed Dose: 50 Gy
Reference Point Dosage Given to Date: 10 Gy
Reference Point Session Dosage Given: 2 Gy
Session Number: 5

## 2022-09-09 ENCOUNTER — Other Ambulatory Visit: Payer: Self-pay

## 2022-09-09 ENCOUNTER — Ambulatory Visit
Admission: RE | Admit: 2022-09-09 | Discharge: 2022-09-09 | Disposition: A | Discharge status: Home / Self Care | Payer: Medicare Other | Source: Ambulatory Visit | Attending: Radiation Oncology | Admitting: Radiation Oncology

## 2022-09-09 DIAGNOSIS — Z923 Personal history of irradiation: Secondary | ICD-10-CM | POA: Diagnosis not present

## 2022-09-09 DIAGNOSIS — R918 Other nonspecific abnormal finding of lung field: Secondary | ICD-10-CM | POA: Diagnosis not present

## 2022-09-09 DIAGNOSIS — C44311 Basal cell carcinoma of skin of nose: Secondary | ICD-10-CM | POA: Diagnosis not present

## 2022-09-09 LAB — RAD ONC ARIA SESSION SUMMARY
Course Elapsed Days: 8
Plan Fractions Treated to Date: 6
Plan Prescribed Dose Per Fraction: 2 Gy
Plan Total Fractions Prescribed: 25
Plan Total Prescribed Dose: 50 Gy
Reference Point Dosage Given to Date: 12 Gy
Reference Point Session Dosage Given: 2 Gy
Session Number: 6

## 2022-09-10 ENCOUNTER — Ambulatory Visit
Admission: RE | Admit: 2022-09-10 | Discharge: 2022-09-10 | Disposition: A | Discharge status: Home / Self Care | Payer: Medicare Other | Source: Ambulatory Visit | Attending: Radiation Oncology | Admitting: Radiation Oncology

## 2022-09-10 ENCOUNTER — Other Ambulatory Visit: Payer: Self-pay

## 2022-09-10 DIAGNOSIS — C44311 Basal cell carcinoma of skin of nose: Secondary | ICD-10-CM | POA: Diagnosis not present

## 2022-09-10 DIAGNOSIS — Z923 Personal history of irradiation: Secondary | ICD-10-CM | POA: Diagnosis not present

## 2022-09-10 DIAGNOSIS — R918 Other nonspecific abnormal finding of lung field: Secondary | ICD-10-CM | POA: Diagnosis not present

## 2022-09-10 LAB — RAD ONC ARIA SESSION SUMMARY
Course Elapsed Days: 9
Plan Fractions Treated to Date: 7
Plan Prescribed Dose Per Fraction: 2 Gy
Plan Total Fractions Prescribed: 25
Plan Total Prescribed Dose: 50 Gy
Reference Point Dosage Given to Date: 14 Gy
Reference Point Session Dosage Given: 2 Gy
Session Number: 7

## 2022-09-11 ENCOUNTER — Other Ambulatory Visit: Payer: Self-pay

## 2022-09-11 ENCOUNTER — Ambulatory Visit
Admission: RE | Admit: 2022-09-11 | Discharge: 2022-09-11 | Disposition: A | Discharge status: Home / Self Care | Payer: Medicare Other | Source: Ambulatory Visit | Attending: Radiation Oncology | Admitting: Radiation Oncology

## 2022-09-11 DIAGNOSIS — R918 Other nonspecific abnormal finding of lung field: Secondary | ICD-10-CM | POA: Diagnosis not present

## 2022-09-11 DIAGNOSIS — C44311 Basal cell carcinoma of skin of nose: Secondary | ICD-10-CM | POA: Diagnosis not present

## 2022-09-11 DIAGNOSIS — Z923 Personal history of irradiation: Secondary | ICD-10-CM | POA: Diagnosis not present

## 2022-09-11 LAB — RAD ONC ARIA SESSION SUMMARY
Course Elapsed Days: 10
Plan Fractions Treated to Date: 8
Plan Prescribed Dose Per Fraction: 2 Gy
Plan Total Fractions Prescribed: 25
Plan Total Prescribed Dose: 50 Gy
Reference Point Dosage Given to Date: 16 Gy
Reference Point Session Dosage Given: 2 Gy
Session Number: 8

## 2022-09-12 ENCOUNTER — Ambulatory Visit: Admission: RE | Admit: 2022-09-12 | Payer: Medicare Other | Source: Ambulatory Visit

## 2022-09-12 ENCOUNTER — Other Ambulatory Visit: Payer: Self-pay

## 2022-09-12 DIAGNOSIS — R918 Other nonspecific abnormal finding of lung field: Secondary | ICD-10-CM | POA: Diagnosis not present

## 2022-09-12 DIAGNOSIS — Z923 Personal history of irradiation: Secondary | ICD-10-CM | POA: Diagnosis not present

## 2022-09-12 DIAGNOSIS — C44311 Basal cell carcinoma of skin of nose: Secondary | ICD-10-CM | POA: Diagnosis not present

## 2022-09-12 LAB — RAD ONC ARIA SESSION SUMMARY
Course Elapsed Days: 11
Plan Fractions Treated to Date: 9
Plan Prescribed Dose Per Fraction: 2 Gy
Plan Total Fractions Prescribed: 25
Plan Total Prescribed Dose: 50 Gy
Reference Point Dosage Given to Date: 18 Gy
Reference Point Session Dosage Given: 2 Gy
Session Number: 9

## 2022-09-15 ENCOUNTER — Other Ambulatory Visit: Payer: Medicare Other

## 2022-09-15 ENCOUNTER — Other Ambulatory Visit: Payer: Self-pay

## 2022-09-15 ENCOUNTER — Ambulatory Visit
Admission: RE | Admit: 2022-09-15 | Discharge: 2022-09-15 | Disposition: A | Discharge status: Home / Self Care | Payer: Medicare Other | Source: Ambulatory Visit | Attending: Radiation Oncology | Admitting: Radiation Oncology

## 2022-09-15 DIAGNOSIS — E1165 Type 2 diabetes mellitus with hyperglycemia: Secondary | ICD-10-CM

## 2022-09-15 DIAGNOSIS — Z923 Personal history of irradiation: Secondary | ICD-10-CM | POA: Diagnosis not present

## 2022-09-15 DIAGNOSIS — I10 Essential (primary) hypertension: Secondary | ICD-10-CM

## 2022-09-15 DIAGNOSIS — Z510 Encounter for antineoplastic radiation therapy: Secondary | ICD-10-CM | POA: Diagnosis not present

## 2022-09-15 DIAGNOSIS — C44311 Basal cell carcinoma of skin of nose: Secondary | ICD-10-CM | POA: Diagnosis not present

## 2022-09-15 DIAGNOSIS — E782 Mixed hyperlipidemia: Secondary | ICD-10-CM | POA: Diagnosis not present

## 2022-09-15 DIAGNOSIS — Z85118 Personal history of other malignant neoplasm of bronchus and lung: Secondary | ICD-10-CM | POA: Diagnosis not present

## 2022-09-15 DIAGNOSIS — R918 Other nonspecific abnormal finding of lung field: Secondary | ICD-10-CM | POA: Diagnosis not present

## 2022-09-15 DIAGNOSIS — Z87891 Personal history of nicotine dependence: Secondary | ICD-10-CM | POA: Diagnosis not present

## 2022-09-15 LAB — RAD ONC ARIA SESSION SUMMARY
Course Elapsed Days: 14
Plan Fractions Treated to Date: 10
Plan Prescribed Dose Per Fraction: 2 Gy
Plan Total Fractions Prescribed: 25
Plan Total Prescribed Dose: 50 Gy
Reference Point Dosage Given to Date: 20 Gy
Reference Point Session Dosage Given: 2 Gy
Session Number: 10

## 2022-09-16 ENCOUNTER — Other Ambulatory Visit: Payer: Self-pay

## 2022-09-16 ENCOUNTER — Ambulatory Visit
Admission: RE | Admit: 2022-09-16 | Discharge: 2022-09-16 | Disposition: A | Discharge status: Home / Self Care | Payer: Medicare Other | Source: Ambulatory Visit | Attending: Radiation Oncology | Admitting: Radiation Oncology

## 2022-09-16 DIAGNOSIS — Z923 Personal history of irradiation: Secondary | ICD-10-CM | POA: Diagnosis not present

## 2022-09-16 DIAGNOSIS — R918 Other nonspecific abnormal finding of lung field: Secondary | ICD-10-CM | POA: Diagnosis not present

## 2022-09-16 DIAGNOSIS — C44311 Basal cell carcinoma of skin of nose: Secondary | ICD-10-CM | POA: Diagnosis not present

## 2022-09-16 LAB — RAD ONC ARIA SESSION SUMMARY
Course Elapsed Days: 15
Plan Fractions Treated to Date: 11
Plan Prescribed Dose Per Fraction: 2 Gy
Plan Total Fractions Prescribed: 25
Plan Total Prescribed Dose: 50 Gy
Reference Point Dosage Given to Date: 22 Gy
Reference Point Session Dosage Given: 2 Gy
Session Number: 11

## 2022-09-17 ENCOUNTER — Ambulatory Visit: Admission: RE | Admit: 2022-09-17 | Payer: Medicare Other | Source: Ambulatory Visit

## 2022-09-17 ENCOUNTER — Other Ambulatory Visit: Payer: Self-pay

## 2022-09-17 DIAGNOSIS — Z923 Personal history of irradiation: Secondary | ICD-10-CM | POA: Diagnosis not present

## 2022-09-17 DIAGNOSIS — R918 Other nonspecific abnormal finding of lung field: Secondary | ICD-10-CM | POA: Diagnosis not present

## 2022-09-17 DIAGNOSIS — C44311 Basal cell carcinoma of skin of nose: Secondary | ICD-10-CM | POA: Diagnosis not present

## 2022-09-17 LAB — RAD ONC ARIA SESSION SUMMARY
Course Elapsed Days: 16
Plan Fractions Treated to Date: 12
Plan Prescribed Dose Per Fraction: 2 Gy
Plan Total Fractions Prescribed: 25
Plan Total Prescribed Dose: 50 Gy
Reference Point Dosage Given to Date: 24 Gy
Reference Point Session Dosage Given: 2 Gy
Session Number: 12

## 2022-09-18 ENCOUNTER — Other Ambulatory Visit: Payer: Self-pay

## 2022-09-18 ENCOUNTER — Ambulatory Visit
Admission: RE | Admit: 2022-09-18 | Discharge: 2022-09-18 | Disposition: A | Discharge status: Home / Self Care | Payer: Medicare Other | Source: Ambulatory Visit | Attending: Radiation Oncology | Admitting: Radiation Oncology

## 2022-09-18 DIAGNOSIS — R918 Other nonspecific abnormal finding of lung field: Secondary | ICD-10-CM | POA: Insufficient documentation

## 2022-09-18 DIAGNOSIS — C44311 Basal cell carcinoma of skin of nose: Secondary | ICD-10-CM | POA: Insufficient documentation

## 2022-09-18 DIAGNOSIS — Z923 Personal history of irradiation: Secondary | ICD-10-CM | POA: Insufficient documentation

## 2022-09-18 LAB — RAD ONC ARIA SESSION SUMMARY
Course Elapsed Days: 17
Plan Fractions Treated to Date: 13
Plan Prescribed Dose Per Fraction: 2 Gy
Plan Total Fractions Prescribed: 25
Plan Total Prescribed Dose: 50 Gy
Reference Point Dosage Given to Date: 26 Gy
Reference Point Session Dosage Given: 2 Gy
Session Number: 13

## 2022-09-19 ENCOUNTER — Other Ambulatory Visit: Payer: Self-pay

## 2022-09-19 ENCOUNTER — Ambulatory Visit
Admission: RE | Admit: 2022-09-19 | Discharge: 2022-09-19 | Disposition: A | Discharge status: Home / Self Care | Payer: Medicare Other | Source: Ambulatory Visit | Attending: Radiation Oncology | Admitting: Radiation Oncology

## 2022-09-19 DIAGNOSIS — Z923 Personal history of irradiation: Secondary | ICD-10-CM | POA: Diagnosis not present

## 2022-09-19 DIAGNOSIS — R918 Other nonspecific abnormal finding of lung field: Secondary | ICD-10-CM | POA: Diagnosis not present

## 2022-09-19 DIAGNOSIS — C44311 Basal cell carcinoma of skin of nose: Secondary | ICD-10-CM | POA: Diagnosis not present

## 2022-09-19 LAB — RAD ONC ARIA SESSION SUMMARY
Course Elapsed Days: 18
Plan Fractions Treated to Date: 14
Plan Prescribed Dose Per Fraction: 2 Gy
Plan Total Fractions Prescribed: 25
Plan Total Prescribed Dose: 50 Gy
Reference Point Dosage Given to Date: 28 Gy
Reference Point Session Dosage Given: 2 Gy
Session Number: 14

## 2022-09-22 ENCOUNTER — Ambulatory Visit (INDEPENDENT_AMBULATORY_CARE_PROVIDER_SITE_OTHER): Payer: Medicare Other | Admitting: Internal Medicine

## 2022-09-22 ENCOUNTER — Other Ambulatory Visit: Payer: Self-pay

## 2022-09-22 ENCOUNTER — Ambulatory Visit: Admission: RE | Admit: 2022-09-22 | Payer: Medicare Other | Source: Ambulatory Visit

## 2022-09-22 ENCOUNTER — Encounter: Payer: Self-pay | Admitting: Internal Medicine

## 2022-09-22 VITALS — BP 134/68 | HR 60 | Ht 71.0 in | Wt 180.8 lb

## 2022-09-22 DIAGNOSIS — R918 Other nonspecific abnormal finding of lung field: Secondary | ICD-10-CM | POA: Diagnosis not present

## 2022-09-22 DIAGNOSIS — C3490 Malignant neoplasm of unspecified part of unspecified bronchus or lung: Secondary | ICD-10-CM | POA: Diagnosis not present

## 2022-09-22 DIAGNOSIS — I25119 Atherosclerotic heart disease of native coronary artery with unspecified angina pectoris: Secondary | ICD-10-CM

## 2022-09-22 DIAGNOSIS — C44310 Basal cell carcinoma of skin of unspecified parts of face: Secondary | ICD-10-CM

## 2022-09-22 DIAGNOSIS — Z85118 Personal history of other malignant neoplasm of bronchus and lung: Secondary | ICD-10-CM | POA: Diagnosis not present

## 2022-09-22 DIAGNOSIS — E1165 Type 2 diabetes mellitus with hyperglycemia: Secondary | ICD-10-CM | POA: Diagnosis not present

## 2022-09-22 DIAGNOSIS — E782 Mixed hyperlipidemia: Secondary | ICD-10-CM | POA: Diagnosis not present

## 2022-09-22 DIAGNOSIS — Z87891 Personal history of nicotine dependence: Secondary | ICD-10-CM | POA: Diagnosis not present

## 2022-09-22 DIAGNOSIS — Z923 Personal history of irradiation: Secondary | ICD-10-CM | POA: Diagnosis not present

## 2022-09-22 DIAGNOSIS — Z510 Encounter for antineoplastic radiation therapy: Secondary | ICD-10-CM | POA: Diagnosis not present

## 2022-09-22 DIAGNOSIS — I10 Essential (primary) hypertension: Secondary | ICD-10-CM

## 2022-09-22 DIAGNOSIS — C44311 Basal cell carcinoma of skin of nose: Secondary | ICD-10-CM | POA: Diagnosis not present

## 2022-09-22 LAB — RAD ONC ARIA SESSION SUMMARY
Course Elapsed Days: 21
Plan Fractions Treated to Date: 15
Plan Prescribed Dose Per Fraction: 2 Gy
Plan Total Fractions Prescribed: 25
Plan Total Prescribed Dose: 50 Gy
Reference Point Dosage Given to Date: 30 Gy
Reference Point Session Dosage Given: 2 Gy
Session Number: 15

## 2022-09-22 LAB — POCT CBG (FASTING - GLUCOSE)-MANUAL ENTRY: Glucose Fasting, POC: 158 mg/dL — AB (ref 70–99)

## 2022-09-22 NOTE — Progress Notes (Signed)
Established Patient Office Visit  Subjective:  Patient ID: Erik Collins, male    DOB: [birth year]  Age: 77 y.o. MRN: [id number]  Chief Complaint  Patient presents with   Follow-up    4 month follow up    Patient comes in for his follow-up today.  His labs were done and results discussed.  His hemoglobin is A1c has gone up slightly.  He admits to eating a heavy breakfast.  Will adjust his diet further.  Will not make changes in his medications at this time. 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  Also new nodule has been identified in his lung for which he will start radiation treatment in a month. He denies any chest pain or shortness of breath, no cough or chest congestion.  No other concerns at this time.   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   R nose lateral supratip - referred for radiation oncology   Cancer [facility])    hx lung cancer   CHF (congestive heart failure) (HCC)    Complication of anesthesia    knee surgery caused arrythmia requiring a temporary pacemaker   COPD (chronic obstructive pulmonary disease) (HCC)    Coronary artery disease    s/p CABG   Diabetes mellitus without complication (HCC)    Diverticulosis    Duodenitis    Dysrhythmia    during total knee replacement patient required a temporary pacemaker   GERD (gastroesophageal reflux disease)    Hypercholesterolemia    Hyperplastic colon polyp    Hypertension    Internal hemorrhoids    Lung cancer (HCC)    Lung cancer (HCC)    Myocardial infarction (HCC)    09/2009 f/up with Dr. Adrian Blackwater   Sleep apnea     Past Surgical History:  Procedure Laterality Date   BACK SURGERY     CATARACT EXTRACTION W/PHACO Left 11/09/2019   Procedure: CATARACT EXTRACTION PHACO AND INTRAOCULAR LENS PLACEMENT (IOC) LEFT DIABETIC MALYUGIN 5.91 01:01.0 9.6%;  Surgeon: Lockie Mola, MD;  Location: [facility];  Service: Ophthalmology;  Laterality: Left;   CATARACT EXTRACTION W/PHACO Right 12/07/2019   Procedure: CATARACT EXTRACTION PHACO AND INTRAOCULAR LENS PLACEMENT (IOC) RIGHT DIABETIC MALYUGIN 6.38 01:05.1 9.8%;  Surgeon: Lockie Mola, MD;  Location: [facility];  Service: Ophthalmology;  Laterality: Right;   COLONOSCOPY N/A 11/23/2019   Procedure: COLONOSCOPY;  Surgeon: Toledo, Boykin Nearing, MD;  Location: [facility] ENDOSCOPY;  Service: Gastroenterology;  Laterality: N/A;   COLONOSCOPY WITH PROPOFOL N/A 11/09/2014   Procedure: COLONOSCOPY WITH PROPOFOL;  Surgeon: Elnita Maxwell, MD;  Location: [facility] ENDOSCOPY;  Service: Endoscopy;  Laterality: N/A;   CORONARY ANGIOPLASTY     CORONARY ARTERY BYPASS GRAFT  09/2009   quadruple, done at [facility]   CORONARY ARTERY BYPASS GRAFT     ESOPHAGOGASTRODUODENOSCOPY (EGD) WITH PROPOFOL N/A 11/09/2014   Procedure: ESOPHAGOGASTRODUODENOSCOPY (EGD) WITH PROPOFOL;  Surgeon: Elnita Maxwell, MD;  Location: [facility] ENDOSCOPY;  Service: Endoscopy;  Laterality: N/A;   left lower lobectomy  09/2009   at [facility] LAMINECTOMY/DECOMPRESSION MICRODISCECTOMY  12/17/2011   Procedure: LUMBAR LAMINECTOMY/DECOMPRESSION MICRODISCECTOMY 1 LEVEL;  Surgeon: Tia Alert, MD;  Location: [facility] NEURO ORS;  Service: Neurosurgery;  Laterality: Right;  Right Lumbar five-sacral one microdiscectomy   TEMPORARY PACEMAKER Right 07/10/2017   Procedure: TEMPORARY PACEMAKER;  Surgeon: Marcina Millard, MD;  Location: [facility] INVASIVE CV  LAB;  Service: Cardiovascular;  Laterality: Right;   TOTAL KNEE ARTHROPLASTY Left 07/08/2017   Procedure: TOTAL KNEE ARTHROPLASTY;  Surgeon: Deeann Saint, MD;  Location: [facility] ORS;  Service: Orthopedics;  Laterality: Left;    Social History   Socioeconomic History   Marital status: Married    Spouse name: Not on file   Number of children: Not on file   Years of education: Not on file   Highest education level: Not on file  Occupational  History   Not on file  Tobacco Use   Smoking status: Former    Current packs/day: 0.00    Types: Cigarettes    Quit date: 09/19/2009    Years since quitting: 13.0   Smokeless tobacco: Never  Vaping Use   Vaping status: Never Used  Substance and Sexual Activity   Alcohol use: No   Drug use: No   Sexual activity: Not on file  Other Topics Concern   Not on file  Social History Narrative   Not on file   Social Determinants of Health   Financial Resource Strain: Not on file  Food Insecurity: Not on file  Transportation Needs: Not on file  Physical Activity: Not on file  Stress: Not on file  Social Connections: Not on file  Intimate Partner Violence: Not on file    Family History  Problem Relation Age of Onset   Heart disease Mother    Diabetes Mother    Heart disease Father    Stroke Father    Heart attack Father     Allergies  Allergen Reactions   Spironolactone    Statins     Review of Systems  Constitutional: Negative.   HENT: Negative.    Eyes: Negative.   Respiratory: Negative.  Negative for cough, hemoptysis, shortness of breath and wheezing.   Cardiovascular: Negative.  Negative for chest pain, palpitations and leg swelling.  Gastrointestinal: Negative.  Negative for abdominal pain, constipation, diarrhea, heartburn, nausea and vomiting.  Genitourinary: Negative.  Negative for dysuria and flank pain.  Musculoskeletal: Negative.  Negative for joint pain and myalgias.  Skin: Negative.   Neurological: Negative.  Negative for dizziness and headaches.  Endo/Heme/Allergies: Negative.   Psychiatric/Behavioral: Negative.  Negative for depression and suicidal ideas. The patient is not nervous/anxious.        Objective:   BP 134/68   Pulse 60   Ht 5\' 11"  (1.803 m)   Wt 180 lb 12.8 oz (82 kg)   SpO2 96%   BMI 25.22 kg/m   Vitals:   09/22/22 1044  BP: 134/68  Pulse: 60  Height: 5\' 11"  (1.803 m)  Weight: 180 lb 12.8 oz (82 kg)  SpO2: 96%  BMI  (Calculated): 25.23    Physical Exam Vitals and nursing note reviewed.  Constitutional:      Appearance: Normal appearance.  HENT:     Head: Normocephalic and atraumatic.     Nose: Nose normal.     Mouth/Throat:     Mouth: Mucous membranes are moist.     Pharynx: Oropharynx is clear.  Eyes:     Conjunctiva/sclera: Conjunctivae normal.     Pupils: Pupils are equal, round, and reactive to light.  Cardiovascular:     Rate and Rhythm: Normal rate and regular rhythm.     Pulses: Normal pulses.     Heart sounds: Normal heart sounds.  Pulmonary:     Effort: Pulmonary effort is normal.     Breath sounds: Normal breath sounds.  Abdominal:  General: Bowel sounds are normal.     Palpations: Abdomen is soft.  Musculoskeletal:        General: Normal range of motion.     Cervical back: Normal range of motion.  Skin:    General: Skin is warm and dry.  Neurological:     General: No focal deficit present.     Mental Status: He is alert and oriented to person, place, and time.  Psychiatric:        Mood and Affect: Mood normal.        Behavior: Behavior normal.        Judgment: Judgment normal.      Results for orders placed or performed in visit on 09/22/22  Rad Onc Aria Session Summary  Result Value Ref Range   Course ID [email or web address]    Course Intent Curative    Course Start Date 08/25/2022    Session Number 15    Course First Treatment Date 09/01/2022  3:03 PM    Course Last Treatment Date 09/22/2022  2:08 PM    Course Elapsed Days 21    Reference Point ID Rt Nose DP    Reference Point Dosage Given to Date 30 Gy   Reference Point Session Dosage Given 2 Gy   Plan ID HN_NOSE_BO    Plan Name HN_NB    Plan Fractions Treated to Date 15    Plan Total Fractions Prescribed 25    Plan Prescribed Dose Per Fraction 2 Gy   Plan Total Prescribed Dose 50.000000 Gy   Plan Primary Reference Point Rt Nose DP   Results for orders placed or performed in visit on 09/22/22  POCT CBG (Fasting -  Glucose)  Result Value Ref Range   Glucose Fasting, POC 158 (A) 70 - 99 mg/dL    Recent Results (from the past 2160 hour(s))  I-STAT creatinine     Status: None   Collection Time: 07/30/22  8:16 AM  Result Value Ref Range   Creatinine, Ser 1.20 0.61 - 1.24 mg/dL  Rad Onc Aria Session Summary     Status: None   Collection Time: 09/01/22  3:04 PM  Result Value Ref Range   Course ID C2_HN    Course Intent Curative    Course Start Date 08/25/2022    Session Number 1    Course First Treatment Date 09/01/2022  3:03 PM    Course Last Treatment Date 09/01/2022  3:03 PM    Course Elapsed Days 0    Reference Point ID Rt Nose DP    Reference Point Dosage Given to Date 2 Gy   Reference Point Session Dosage Given 2 Gy   Plan ID HN_NOSE_BO    Plan Name HN_NB    Plan Fractions Treated to Date 1    Plan Total Fractions Prescribed 25    Plan Prescribed Dose Per Fraction 2 Gy   Plan Total Prescribed Dose 50.000000 Gy   Plan Primary Reference Point Rt Nose DP   Rad Onc Aria Session Summary     Status: None   Collection Time: 09/02/22  2:18 PM  Result Value Ref Range   Course ID C2_HN    Course Intent Curative    Course Start Date 08/25/2022    Session Number 2    Course First Treatment Date 09/01/2022  3:03 PM    Course Last Treatment Date 09/02/2022  2:18 PM    Course Elapsed Days 1    Reference Point ID Rt Nose DP  Reference Point Dosage Given to Date 4 Gy   Reference Point Session Dosage Given 2 Gy   Plan ID HN_NOSE_BO    Plan Name HN_NB    Plan Fractions Treated to Date 2    Plan Total Fractions Prescribed 25    Plan Prescribed Dose Per Fraction 2 Gy   Plan Total Prescribed Dose 50.000000 Gy   Plan Primary Reference Point Rt Nose DP   Rad Onc Aria Session Summary     Status: None   Collection Time: 09/03/22  2:19 PM  Result Value Ref Range   Course ID C2_HN    Course Intent Curative    Course Start Date 08/25/2022    Session Number 3    Course First Treatment Date 09/01/2022  3:03 PM     Course Last Treatment Date 09/03/2022  2:18 PM    Course Elapsed Days 2    Reference Point ID Rt Nose DP    Reference Point Dosage Given to Date 6 Gy   Reference Point Session Dosage Given 2 Gy   Plan ID HN_NOSE_BO    Plan Name HN_NB    Plan Fractions Treated to Date 3    Plan Total Fractions Prescribed 25    Plan Prescribed Dose Per Fraction 2 Gy   Plan Total Prescribed Dose 50.000000 Gy   Plan Primary Reference Point Rt Nose DP   Rad Onc Aria Session Summary     Status: None   Collection Time: 09/04/22  2:27 PM  Result Value Ref Range   Course ID C2_HN    Course Intent Curative    Course Start Date 08/25/2022    Session Number 4    Course First Treatment Date 09/01/2022  3:03 PM    Course Last Treatment Date 09/04/2022  2:26 PM    Course Elapsed Days 3    Reference Point ID Rt Nose DP    Reference Point Dosage Given to Date 8 Gy   Reference Point Session Dosage Given 2 Gy   Plan ID HN_NOSE_BO    Plan Name HN_NB    Plan Fractions Treated to Date 4    Plan Total Fractions Prescribed 25    Plan Prescribed Dose Per Fraction 2 Gy   Plan Total Prescribed Dose 50.000000 Gy   Plan Primary Reference Point Rt Nose DP   Rad Onc Aria Session Summary     Status: None   Collection Time: 09/08/22  2:20 PM  Result Value Ref Range   Course ID C2_HN    Course Intent Curative    Course Start Date 08/25/2022    Session Number 5    Course First Treatment Date 09/01/2022  3:03 PM    Course Last Treatment Date 09/08/2022  2:20 PM    Course Elapsed Days 7    Reference Point ID Rt Nose DP    Reference Point Dosage Given to Date 10 Gy   Reference Point Session Dosage Given 2 Gy   Plan ID HN_NOSE_BO    Plan Name HN_NB    Plan Fractions Treated to Date 5    Plan Total Fractions Prescribed 25    Plan Prescribed Dose Per Fraction 2 Gy   Plan Total Prescribed Dose 50.000000 Gy   Plan Primary Reference Point Rt Nose DP   Rad Onc Aria Session Summary     Status: None   Collection Time: 09/09/22   2:13 PM  Result Value Ref Range   Course ID C2_HN    Course Intent Curative  Course Start Date 08/25/2022    Session Number 6    Course First Treatment Date 09/01/2022  3:03 PM    Course Last Treatment Date 09/09/2022  2:12 PM    Course Elapsed Days 8    Reference Point ID Rt Nose DP    Reference Point Dosage Given to Date 12 Gy   Reference Point Session Dosage Given 2 Gy   Plan ID HN_NOSE_BO    Plan Name HN_NB    Plan Fractions Treated to Date 6    Plan Total Fractions Prescribed 25    Plan Prescribed Dose Per Fraction 2 Gy   Plan Total Prescribed Dose 50.000000 Gy   Plan Primary Reference Point Rt Nose DP   Rad Onc Aria Session Summary     Status: None   Collection Time: 09/10/22  2:18 PM  Result Value Ref Range   Course ID C2_HN    Course Intent Curative    Course Start Date 08/25/2022    Session Number 7    Course First Treatment Date 09/01/2022  3:03 PM    Course Last Treatment Date 09/10/2022  2:17 PM    Course Elapsed Days 9    Reference Point ID Rt Nose DP    Reference Point Dosage Given to Date 14 Gy   Reference Point Session Dosage Given 2 Gy   Plan ID HN_NOSE_BO    Plan Name HN_NB    Plan Fractions Treated to Date 7    Plan Total Fractions Prescribed 25    Plan Prescribed Dose Per Fraction 2 Gy   Plan Total Prescribed Dose 50.000000 Gy   Plan Primary Reference Point Rt Nose DP   Rad Onc Aria Session Summary     Status: None   Collection Time: 09/11/22  2:00 PM  Result Value Ref Range   Course ID C2_HN    Course Intent Curative    Course Start Date 08/25/2022    Session Number 8    Course First Treatment Date 09/01/2022  3:03 PM    Course Last Treatment Date 09/11/2022  2:00 PM    Course Elapsed Days 10    Reference Point ID Rt Nose DP    Reference Point Dosage Given to Date 16 Gy   Reference Point Session Dosage Given 2 Gy   Plan ID HN_NOSE_BO    Plan Name HN_NB    Plan Fractions Treated to Date 8    Plan Total Fractions Prescribed 25    Plan Prescribed Dose  Per Fraction 2 Gy   Plan Total Prescribed Dose 50.000000 Gy   Plan Primary Reference Point Rt Nose DP   Rad Onc Aria Session Summary     Status: None   Collection Time: 09/12/22 12:14 PM  Result Value Ref Range   Course ID C2_HN    Course Intent Curative    Course Start Date 08/25/2022    Session Number 9    Course First Treatment Date 09/01/2022  3:03 PM    Course Last Treatment Date 09/12/2022 12:13 PM    Course Elapsed Days 11    Reference Point ID Rt Nose DP    Reference Point Dosage Given to Date 18 Gy   Reference Point Session Dosage Given 2 Gy   Plan ID HN_NOSE_BO    Plan Name HN_NB    Plan Fractions Treated to Date 9    Plan Total Fractions Prescribed 25    Plan Prescribed Dose Per Fraction 2 Gy   Plan Total Prescribed Dose  50.000000 Gy   Plan Primary Reference Point Rt Nose DP   Hemoglobin A1c     Status: Abnormal   Collection Time: 09/15/22  8:50 AM  Result Value Ref Range   Hgb A1c MFr Bld 7.3 (H) 4.8 - 5.6 %    Comment:          Prediabetes: 5.7 - 6.4          Diabetes: >6.4          Glycemic control for adults with diabetes: <7.0    Est. average glucose Bld gHb Est-mCnc 163 mg/dL  TSH     Status: None   Collection Time: 09/15/22  8:50 AM  Result Value Ref Range   TSH 2.390 0.450 - 4.500 uIU/mL  CMP14+EGFR     Status: Abnormal   Collection Time: 09/15/22  8:50 AM  Result Value Ref Range   Glucose 115 (H) 70 - 99 mg/dL   BUN 32 (H) 8 - 27 mg/dL   Creatinine, Ser 9.81 (H) 0.76 - 1.27 mg/dL   eGFR 56 (L) >19 JY/NWG/9.56   BUN/Creatinine Ratio 24 10 - 24   Sodium 141 134 - 144 mmol/L   Potassium 5.4 (H) 3.5 - 5.2 mmol/L   Chloride 107 (H) 96 - 106 mmol/L   CO2 20 20 - 29 mmol/L   Calcium 9.6 8.6 - 10.2 mg/dL   Total Protein 7.1 6.0 - 8.5 g/dL   Albumin 4.6 3.8 - 4.8 g/dL   Globulin, Total 2.5 1.5 - 4.5 g/dL   Bilirubin Total 0.3 0.0 - 1.2 mg/dL   Alkaline Phosphatase 47 44 - 121 IU/L   AST 20 0 - 40 IU/L   ALT 12 0 - 44 IU/L  Lipid panel     Status:  Abnormal   Collection Time: 09/15/22  8:50 AM  Result Value Ref Range   Cholesterol, Total 112 100 - 199 mg/dL   Triglycerides 74 0 - 149 mg/dL   HDL 36 (L) >21 mg/dL   VLDL Cholesterol Cal 15 5 - 40 mg/dL   LDL Chol Calc (NIH) 61 0 - 99 mg/dL   Chol/HDL Ratio 3.1 0.0 - 5.0 ratio    Comment:                                   T. Chol/HDL Ratio                                             Men  Women                               1/2 Avg.Risk  3.4    3.3                                   Avg.Risk  5.0    4.4                                2X Avg.Risk  9.6    7.1  3X Avg.Risk 23.4   11.0   CBC with Differential/Platelet     Status: Abnormal   Collection Time: 09/15/22  8:50 AM  Result Value Ref Range   WBC 6.3 3.4 - 10.8 x10E3/uL   RBC 4.01 (L) 4.14 - 5.80 x10E6/uL   Hemoglobin 12.2 (L) 13.0 - 17.7 g/dL   Hematocrit 16.1 09.6 - 51.0 %   MCV 95 79 - 97 fL   MCH 30.4 26.6 - 33.0 pg   MCHC 32.2 31.5 - 35.7 g/dL   RDW 04.5 40.9 - 81.1 %   Platelets 244 150 - 450 x10E3/uL   Neutrophils 73 Not Estab. %   Lymphs 16 Not Estab. %   Monocytes 9 Not Estab. %   Eos 1 Not Estab. %   Basos 0 Not Estab. %   Neutrophils Absolute 4.6 1.4 - 7.0 x10E3/uL   Lymphocytes Absolute 1.0 0.7 - 3.1 x10E3/uL   Monocytes Absolute 0.5 0.1 - 0.9 x10E3/uL   EOS (ABSOLUTE) 0.1 0.0 - 0.4 x10E3/uL   Basophils Absolute 0.0 0.0 - 0.2 x10E3/uL   Immature Granulocytes 1 Not Estab. %   Immature Grans (Abs) 0.0 0.0 - 0.1 x10E3/uL  Specimen status report     Status: None   Collection Time: 09/15/22  8:50 AM  Result Value Ref Range   specimen status report Comment     Comment: Written Authorization Written Authorization Written Authorization Received. Authorization received from [employer] 09-16-2022 Logged by Charna Archer Session Summary     Status: None   Collection Time: 09/15/22  2:16 PM  Result Value Ref Range   Course ID [email or web address]    Course Intent Curative    Course  Start Date 08/25/2022    Session Number 10    Course First Treatment Date 09/01/2022  3:03 PM    Course Last Treatment Date 09/15/2022  2:16 PM    Course Elapsed Days 14    Reference Point ID Rt Nose DP    Reference Point Dosage Given to Date 20 Gy   Reference Point Session Dosage Given 2 Gy   Plan ID HN_NOSE_BO    Plan Name HN_NB    Plan Fractions Treated to Date 10    Plan Total Fractions Prescribed 25    Plan Prescribed Dose Per Fraction 2 Gy   Plan Total Prescribed Dose 50.000000 Gy   Plan Primary Reference Point Rt Nose DP   Rad Onc Aria Session Summary     Status: None   Collection Time: 09/16/22  2:10 PM  Result Value Ref Range   Course ID C2_HN    Course Intent Curative    Course Start Date 08/25/2022    Session Number 11    Course First Treatment Date 09/01/2022  3:03 PM    Course Last Treatment Date 09/16/2022  2:09 PM    Course Elapsed Days 15    Reference Point ID Rt Nose DP    Reference Point Dosage Given to Date 22 Gy   Reference Point Session Dosage Given 2 Gy   Plan ID HN_NOSE_BO    Plan Name HN_NB    Plan Fractions Treated to Date 11    Plan Total Fractions Prescribed 25    Plan Prescribed Dose Per Fraction 2 Gy   Plan Total Prescribed Dose 50.000000 Gy   Plan Primary Reference Point Rt Nose DP   Rad Onc Aria Session Summary     Status: None   Collection Time: 09/17/22  2:11  PM  Result Value Ref Range   Course ID C2_HN    Course Intent Curative    Course Start Date 08/25/2022    Session Number 12    Course First Treatment Date 09/01/2022  3:03 PM    Course Last Treatment Date 09/17/2022  2:11 PM    Course Elapsed Days 16    Reference Point ID Rt Nose DP    Reference Point Dosage Given to Date 24 Gy   Reference Point Session Dosage Given 2 Gy   Plan ID HN_NOSE_BO    Plan Name HN_NB    Plan Fractions Treated to Date 12    Plan Total Fractions Prescribed 25    Plan Prescribed Dose Per Fraction 2 Gy   Plan Total Prescribed Dose 50.000000 Gy   Plan Primary  Reference Point Rt Nose DP   Rad Onc Aria Session Summary     Status: None   Collection Time: 09/18/22  2:17 PM  Result Value Ref Range   Course ID C2_HN    Course Intent Curative    Course Start Date 08/25/2022    Session Number 13    Course First Treatment Date 09/01/2022  3:03 PM    Course Last Treatment Date 09/18/2022  2:16 PM    Course Elapsed Days 17    Reference Point ID Rt Nose DP    Reference Point Dosage Given to Date 26 Gy   Reference Point Session Dosage Given 2 Gy   Plan ID HN_NOSE_BO    Plan Name HN_NB    Plan Fractions Treated to Date 13    Plan Total Fractions Prescribed 25    Plan Prescribed Dose Per Fraction 2 Gy   Plan Total Prescribed Dose 50.000000 Gy   Plan Primary Reference Point Rt Nose DP   Rad Onc Aria Session Summary     Status: None   Collection Time: 09/19/22 10:59 AM  Result Value Ref Range   Course ID C2_HN    Course Intent Curative    Course Start Date 08/25/2022    Session Number 14    Course First Treatment Date 09/01/2022  3:03 PM    Course Last Treatment Date 09/19/2022 10:58 AM    Course Elapsed Days 18    Reference Point ID Rt Nose DP    Reference Point Dosage Given to Date 28 Gy   Reference Point Session Dosage Given 2 Gy   Plan ID HN_NOSE_BO    Plan Name HN_NB    Plan Fractions Treated to Date 14    Plan Total Fractions Prescribed 25    Plan Prescribed Dose Per Fraction 2 Gy   Plan Total Prescribed Dose 50.000000 Gy   Plan Primary Reference Point Rt Nose DP   POCT CBG (Fasting - Glucose)     Status: Abnormal   Collection Time: 09/22/22 10:51 AM  Result Value Ref Range   Glucose Fasting, POC 158 (A) 70 - 99 mg/dL  Rad Onc Aria Session Summary     Status: None   Collection Time: 09/22/22  2:09 PM  Result Value Ref Range   Course ID C2_HN    Course Intent Curative    Course Start Date 08/25/2022    Session Number 15    Course First Treatment Date 09/01/2022  3:03 PM    Course Last Treatment Date 09/22/2022  2:08 PM    Course Elapsed Days 21     Reference Point ID Rt Nose DP    Reference Point Dosage Given to  Date 30 Gy   Reference Point Session Dosage Given 2 Gy   Plan ID HN_NOSE_BO    Plan Name HN_NB    Plan Fractions Treated to Date 15    Plan Total Fractions Prescribed 25    Plan Prescribed Dose Per Fraction 2 Gy   Plan Total Prescribed Dose 50.000000 Gy   Plan Primary Reference Point Rt Nose DP       Assessment & Plan:  Patient will get a repeat BMP in 1 week.  Continue strict diet control.  Monitor blood sugars at home.   Problem List Items Addressed This Visit     Essential hypertension   Hyperlipidemia   Diabetes (HCC) - Primary   Relevant Orders   POCT CBG (Fasting - Glucose) (Completed)   Basic metabolic panel   Coronary disease   Recurrent non-small cell lung cancer (HCC)   Facial basal cell cancer    Follow up 3 month  Total time spent: 30 minutes  Margaretann Loveless, MD  09/22/2022   This document may have been prepared by Dragon Voice Recognition software and as such may include unintentional dictation errors.

## 2022-09-23 ENCOUNTER — Other Ambulatory Visit: Payer: Self-pay | Admitting: Internal Medicine

## 2022-09-23 ENCOUNTER — Other Ambulatory Visit: Payer: Self-pay

## 2022-09-23 ENCOUNTER — Ambulatory Visit
Admission: RE | Admit: 2022-09-23 | Discharge: 2022-09-23 | Disposition: A | Discharge status: Home / Self Care | Payer: Medicare Other | Source: Ambulatory Visit | Attending: Radiation Oncology | Admitting: Radiation Oncology

## 2022-09-23 DIAGNOSIS — C44311 Basal cell carcinoma of skin of nose: Secondary | ICD-10-CM | POA: Diagnosis not present

## 2022-09-23 DIAGNOSIS — Z923 Personal history of irradiation: Secondary | ICD-10-CM | POA: Diagnosis not present

## 2022-09-23 DIAGNOSIS — R918 Other nonspecific abnormal finding of lung field: Secondary | ICD-10-CM | POA: Diagnosis not present

## 2022-09-23 LAB — RAD ONC ARIA SESSION SUMMARY
Course Elapsed Days: 22
Plan Fractions Treated to Date: 16
Plan Prescribed Dose Per Fraction: 2 Gy
Plan Total Fractions Prescribed: 25
Plan Total Prescribed Dose: 50 Gy
Reference Point Dosage Given to Date: 32 Gy
Reference Point Session Dosage Given: 2 Gy
Session Number: 16

## 2022-09-24 ENCOUNTER — Other Ambulatory Visit: Payer: Self-pay

## 2022-09-24 ENCOUNTER — Ambulatory Visit
Admission: RE | Admit: 2022-09-24 | Discharge: 2022-09-24 | Disposition: A | Discharge status: Home / Self Care | Payer: Medicare Other | Source: Ambulatory Visit | Attending: Radiation Oncology | Admitting: Radiation Oncology

## 2022-09-24 DIAGNOSIS — R918 Other nonspecific abnormal finding of lung field: Secondary | ICD-10-CM | POA: Diagnosis not present

## 2022-09-24 DIAGNOSIS — Z923 Personal history of irradiation: Secondary | ICD-10-CM | POA: Diagnosis not present

## 2022-09-24 DIAGNOSIS — C44311 Basal cell carcinoma of skin of nose: Secondary | ICD-10-CM | POA: Diagnosis not present

## 2022-09-24 LAB — RAD ONC ARIA SESSION SUMMARY
Course Elapsed Days: 23
Plan Fractions Treated to Date: 17
Plan Prescribed Dose Per Fraction: 2 Gy
Plan Total Fractions Prescribed: 25
Plan Total Prescribed Dose: 50 Gy
Reference Point Dosage Given to Date: 34 Gy
Reference Point Session Dosage Given: 2 Gy
Session Number: 17

## 2022-09-25 ENCOUNTER — Other Ambulatory Visit: Payer: Self-pay

## 2022-09-25 ENCOUNTER — Ambulatory Visit
Admission: RE | Admit: 2022-09-25 | Discharge: 2022-09-25 | Disposition: A | Discharge status: Home / Self Care | Payer: Medicare Other | Source: Ambulatory Visit | Attending: Radiation Oncology | Admitting: Radiation Oncology

## 2022-09-25 DIAGNOSIS — Z923 Personal history of irradiation: Secondary | ICD-10-CM | POA: Diagnosis not present

## 2022-09-25 DIAGNOSIS — R918 Other nonspecific abnormal finding of lung field: Secondary | ICD-10-CM | POA: Diagnosis not present

## 2022-09-25 DIAGNOSIS — C44311 Basal cell carcinoma of skin of nose: Secondary | ICD-10-CM | POA: Diagnosis not present

## 2022-09-25 LAB — RAD ONC ARIA SESSION SUMMARY
Course Elapsed Days: 24
Plan Fractions Treated to Date: 18
Plan Prescribed Dose Per Fraction: 2 Gy
Plan Total Fractions Prescribed: 25
Plan Total Prescribed Dose: 50 Gy
Reference Point Dosage Given to Date: 36 Gy
Reference Point Session Dosage Given: 2 Gy
Session Number: 18

## 2022-09-26 ENCOUNTER — Other Ambulatory Visit: Payer: Self-pay

## 2022-09-26 ENCOUNTER — Ambulatory Visit: Admission: RE | Admit: 2022-09-26 | Payer: Medicare Other | Source: Ambulatory Visit

## 2022-09-26 DIAGNOSIS — C44311 Basal cell carcinoma of skin of nose: Secondary | ICD-10-CM | POA: Diagnosis not present

## 2022-09-26 DIAGNOSIS — Z923 Personal history of irradiation: Secondary | ICD-10-CM | POA: Diagnosis not present

## 2022-09-26 DIAGNOSIS — R918 Other nonspecific abnormal finding of lung field: Secondary | ICD-10-CM | POA: Diagnosis not present

## 2022-09-26 LAB — RAD ONC ARIA SESSION SUMMARY
Course Elapsed Days: 25
Plan Fractions Treated to Date: 19
Plan Prescribed Dose Per Fraction: 2 Gy
Plan Total Fractions Prescribed: 25
Plan Total Prescribed Dose: 50 Gy
Reference Point Dosage Given to Date: 38 Gy
Reference Point Session Dosage Given: 2 Gy
Session Number: 19

## 2022-09-29 ENCOUNTER — Other Ambulatory Visit: Payer: Self-pay

## 2022-09-29 ENCOUNTER — Other Ambulatory Visit: Payer: Medicare Other

## 2022-09-29 ENCOUNTER — Ambulatory Visit
Admission: RE | Admit: 2022-09-29 | Discharge: 2022-09-29 | Disposition: A | Discharge status: Home / Self Care | Payer: Medicare Other | Source: Ambulatory Visit | Attending: Radiation Oncology | Admitting: Radiation Oncology

## 2022-09-29 DIAGNOSIS — C44311 Basal cell carcinoma of skin of nose: Secondary | ICD-10-CM | POA: Diagnosis not present

## 2022-09-29 DIAGNOSIS — Z510 Encounter for antineoplastic radiation therapy: Secondary | ICD-10-CM | POA: Diagnosis not present

## 2022-09-29 DIAGNOSIS — Z923 Personal history of irradiation: Secondary | ICD-10-CM | POA: Diagnosis not present

## 2022-09-29 DIAGNOSIS — Z85118 Personal history of other malignant neoplasm of bronchus and lung: Secondary | ICD-10-CM | POA: Diagnosis not present

## 2022-09-29 DIAGNOSIS — R918 Other nonspecific abnormal finding of lung field: Secondary | ICD-10-CM | POA: Diagnosis not present

## 2022-09-29 DIAGNOSIS — E1165 Type 2 diabetes mellitus with hyperglycemia: Secondary | ICD-10-CM

## 2022-09-29 DIAGNOSIS — Z87891 Personal history of nicotine dependence: Secondary | ICD-10-CM | POA: Diagnosis not present

## 2022-09-29 LAB — RAD ONC ARIA SESSION SUMMARY
Course Elapsed Days: 28
Plan Fractions Treated to Date: 20
Plan Prescribed Dose Per Fraction: 2 Gy
Plan Total Fractions Prescribed: 25
Plan Total Prescribed Dose: 50 Gy
Reference Point Dosage Given to Date: 40 Gy
Reference Point Session Dosage Given: 2 Gy
Session Number: 20

## 2022-09-30 ENCOUNTER — Other Ambulatory Visit: Payer: Self-pay | Admitting: Internal Medicine

## 2022-09-30 ENCOUNTER — Ambulatory Visit
Admission: RE | Admit: 2022-09-30 | Discharge: 2022-09-30 | Disposition: A | Discharge status: Home / Self Care | Payer: Medicare Other | Source: Ambulatory Visit | Attending: Radiation Oncology | Admitting: Radiation Oncology

## 2022-09-30 ENCOUNTER — Other Ambulatory Visit: Payer: Self-pay

## 2022-09-30 DIAGNOSIS — N189 Chronic kidney disease, unspecified: Secondary | ICD-10-CM

## 2022-09-30 DIAGNOSIS — Z923 Personal history of irradiation: Secondary | ICD-10-CM | POA: Diagnosis not present

## 2022-09-30 DIAGNOSIS — R918 Other nonspecific abnormal finding of lung field: Secondary | ICD-10-CM | POA: Diagnosis not present

## 2022-09-30 DIAGNOSIS — C44311 Basal cell carcinoma of skin of nose: Secondary | ICD-10-CM | POA: Diagnosis not present

## 2022-09-30 LAB — RAD ONC ARIA SESSION SUMMARY
Course Elapsed Days: 29
Plan Fractions Treated to Date: 21
Plan Prescribed Dose Per Fraction: 2 Gy
Plan Total Fractions Prescribed: 25
Plan Total Prescribed Dose: 50 Gy
Reference Point Dosage Given to Date: 42 Gy
Reference Point Session Dosage Given: 2 Gy
Session Number: 21

## 2022-09-30 NOTE — Progress Notes (Signed)
Patient notified

## 2022-10-01 ENCOUNTER — Other Ambulatory Visit: Payer: Self-pay

## 2022-10-01 ENCOUNTER — Ambulatory Visit
Admission: RE | Admit: 2022-10-01 | Discharge: 2022-10-01 | Disposition: A | Discharge status: Home / Self Care | Payer: Medicare Other | Source: Ambulatory Visit | Attending: Radiation Oncology | Admitting: Radiation Oncology

## 2022-10-01 DIAGNOSIS — C44311 Basal cell carcinoma of skin of nose: Secondary | ICD-10-CM | POA: Diagnosis not present

## 2022-10-01 DIAGNOSIS — R918 Other nonspecific abnormal finding of lung field: Secondary | ICD-10-CM | POA: Diagnosis not present

## 2022-10-01 DIAGNOSIS — Z923 Personal history of irradiation: Secondary | ICD-10-CM | POA: Diagnosis not present

## 2022-10-01 LAB — RAD ONC ARIA SESSION SUMMARY
Course Elapsed Days: 30
Plan Fractions Treated to Date: 22
Plan Prescribed Dose Per Fraction: 2 Gy
Plan Total Fractions Prescribed: 25
Plan Total Prescribed Dose: 50 Gy
Reference Point Dosage Given to Date: 44 Gy
Reference Point Session Dosage Given: 2 Gy
Session Number: 22

## 2022-10-02 ENCOUNTER — Other Ambulatory Visit: Payer: Self-pay

## 2022-10-02 ENCOUNTER — Ambulatory Visit
Admission: RE | Admit: 2022-10-02 | Discharge: 2022-10-02 | Disposition: A | Discharge status: Home / Self Care | Payer: Medicare Other | Source: Ambulatory Visit | Attending: Radiation Oncology | Admitting: Radiation Oncology

## 2022-10-02 DIAGNOSIS — R918 Other nonspecific abnormal finding of lung field: Secondary | ICD-10-CM | POA: Diagnosis not present

## 2022-10-02 DIAGNOSIS — C44311 Basal cell carcinoma of skin of nose: Secondary | ICD-10-CM | POA: Diagnosis not present

## 2022-10-02 DIAGNOSIS — Z923 Personal history of irradiation: Secondary | ICD-10-CM | POA: Diagnosis not present

## 2022-10-02 LAB — RAD ONC ARIA SESSION SUMMARY
Course Elapsed Days: 31
Plan Fractions Treated to Date: 23
Plan Prescribed Dose Per Fraction: 2 Gy
Plan Total Fractions Prescribed: 25
Plan Total Prescribed Dose: 50 Gy
Reference Point Dosage Given to Date: 46 Gy
Reference Point Session Dosage Given: 2 Gy
Session Number: 23

## 2022-10-03 ENCOUNTER — Ambulatory Visit: Payer: Medicare Other

## 2022-10-03 ENCOUNTER — Other Ambulatory Visit: Payer: Self-pay

## 2022-10-03 ENCOUNTER — Ambulatory Visit: Admission: RE | Admit: 2022-10-03 | Payer: Medicare Other | Source: Ambulatory Visit

## 2022-10-03 DIAGNOSIS — Z923 Personal history of irradiation: Secondary | ICD-10-CM | POA: Diagnosis not present

## 2022-10-03 DIAGNOSIS — C44311 Basal cell carcinoma of skin of nose: Secondary | ICD-10-CM | POA: Diagnosis not present

## 2022-10-03 DIAGNOSIS — R918 Other nonspecific abnormal finding of lung field: Secondary | ICD-10-CM | POA: Diagnosis not present

## 2022-10-03 LAB — RAD ONC ARIA SESSION SUMMARY
Course Elapsed Days: 32
Plan Fractions Treated to Date: 24
Plan Prescribed Dose Per Fraction: 2 Gy
Plan Total Fractions Prescribed: 25
Plan Total Prescribed Dose: 50 Gy
Reference Point Dosage Given to Date: 48 Gy
Reference Point Session Dosage Given: 2 Gy
Session Number: 24

## 2022-10-06 ENCOUNTER — Ambulatory Visit
Admission: RE | Admit: 2022-10-06 | Discharge: 2022-10-06 | Disposition: A | Discharge status: Home / Self Care | Payer: Medicare Other | Source: Ambulatory Visit | Attending: Radiation Oncology | Admitting: Radiation Oncology

## 2022-10-06 ENCOUNTER — Other Ambulatory Visit: Payer: Self-pay

## 2022-10-06 ENCOUNTER — Other Ambulatory Visit: Payer: Medicare Other

## 2022-10-06 DIAGNOSIS — R918 Other nonspecific abnormal finding of lung field: Secondary | ICD-10-CM | POA: Diagnosis not present

## 2022-10-06 DIAGNOSIS — Z510 Encounter for antineoplastic radiation therapy: Secondary | ICD-10-CM | POA: Diagnosis not present

## 2022-10-06 DIAGNOSIS — Z85118 Personal history of other malignant neoplasm of bronchus and lung: Secondary | ICD-10-CM | POA: Diagnosis not present

## 2022-10-06 DIAGNOSIS — Z923 Personal history of irradiation: Secondary | ICD-10-CM | POA: Diagnosis not present

## 2022-10-06 DIAGNOSIS — C44311 Basal cell carcinoma of skin of nose: Secondary | ICD-10-CM | POA: Diagnosis not present

## 2022-10-06 DIAGNOSIS — Z87891 Personal history of nicotine dependence: Secondary | ICD-10-CM | POA: Diagnosis not present

## 2022-10-06 DIAGNOSIS — I10 Essential (primary) hypertension: Secondary | ICD-10-CM

## 2022-10-06 LAB — RAD ONC ARIA SESSION SUMMARY
Course Elapsed Days: 35
Plan Fractions Treated to Date: 25
Plan Prescribed Dose Per Fraction: 2 Gy
Plan Total Fractions Prescribed: 25
Plan Total Prescribed Dose: 50 Gy
Reference Point Dosage Given to Date: 50 Gy
Reference Point Session Dosage Given: 2 Gy
Session Number: 25

## 2022-10-07 LAB — BASIC METABOLIC PANEL
BUN/Creatinine Ratio: 20 (ref 10–24)
BUN: 23 mg/dL (ref 8–27)
CO2: 21 mmol/L (ref 20–29)
Calcium: 9 mg/dL (ref 8.6–10.2)
Chloride: 109 mmol/L — ABNORMAL HIGH (ref 96–106)
Creatinine, Ser: 1.15 mg/dL (ref 0.76–1.27)
Glucose: 98 mg/dL (ref 70–99)
Potassium: 5 mmol/L (ref 3.5–5.2)
Sodium: 144 mmol/L (ref 134–144)
eGFR: 65 mL/min/{1.73_m2} (ref >59)

## 2022-10-13 NOTE — Progress Notes (Signed)
Patient notified

## 2022-11-10 ENCOUNTER — Ambulatory Visit: Payer: Medicare Other | Admitting: Radiation Oncology

## 2022-11-10 ENCOUNTER — Ambulatory Visit
Admission: RE | Admit: 2022-11-10 | Discharge: 2022-11-10 | Disposition: A | Discharge status: Home / Self Care | Payer: Medicare Other | Source: Ambulatory Visit | Attending: Radiation Oncology | Admitting: Radiation Oncology

## 2022-11-10 VITALS — BP 165/68 | HR 55 | Temp 98.9°F | Resp 16 | Ht 71.0 in | Wt 185.0 lb

## 2022-11-10 DIAGNOSIS — C44311 Basal cell carcinoma of skin of nose: Secondary | ICD-10-CM | POA: Insufficient documentation

## 2022-11-10 DIAGNOSIS — Z923 Personal history of irradiation: Secondary | ICD-10-CM | POA: Diagnosis not present

## 2022-11-10 DIAGNOSIS — R918 Other nonspecific abnormal finding of lung field: Secondary | ICD-10-CM | POA: Insufficient documentation

## 2022-11-10 NOTE — Progress Notes (Signed)
Radiation Oncology Follow up Note  Name: Erik Collins   Date:   11/10/2022 MRN:  [id number] DOB: Dec 05, 1945    This 77 y.o. 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  REFERRING PROVIDER: Margaretann Loveless, MD  HPI: Patient is a 77 year old male now out 1 month having completed electron-beam therapy to his nose for basal cell carcinoma seen today in follow-up doing well he is without complaint.  Cosmetically looks excellent no evidence of disease is noted..  Patient has last CT scan since he had SBRT treatment prior showed a new 1.1 cm nodular lesion left lower lobe which could be inflammatory or atelectatic.  He is scheduled for follow-up CT scan in October.  COMPLICATIONS OF TREATMENT: none  FOLLOW UP COMPLIANCE: keeps appointments   PHYSICAL EXAM:  BP (!) 165/68 Comment: patient states he is upset about parking will continue monitor  Pulse (!) 55   Temp 98.9 F (37.2 C)   Resp 16   Ht 5\' 11"  (1.803 m) Comment: stated ht  Wt 185 lb (83.9 kg)   BMI 25.80 kg/m nose shows no evidence of nodular lesions.  No submental or cervical adenopathy is appreciated. Well-developed well-nourished patient in NAD. HEENT reveals PERLA, EOMI, discs not visualized.  Oral cavity is clear. No oral mucosal lesions are identified. Neck is clear without evidence of cervical or supraclavicular adenopathy. Lungs are clear to A&P. Cardiac examination is essentially unremarkable with regular rate and rhythm without murmur rub or thrill. Abdomen is benign with no organomegaly or masses noted. Motor sensory and DTR levels are equal and symmetric in the upper and lower extremities. Cranial nerves II through XII are grossly intact. Proprioception is intact. No peripheral adenopathy or edema is identified. No motor or sensory levels are noted. Crude visual fields are within normal range.  RADIOLOGY RESULTS: No current films for  review  PLAN: Present time I have asked to see the patient shortly after his CT scan in October.  Should there be progression in this nodule we may order a PET CT scan for better clarification.  Follow-up was arranged after his CT scan.  Patient and wife know to call sooner with any concerns.  I would like to take this opportunity to thank you for allowing me to participate in the care of your patient.Carmina Miller, MD

## 2022-11-12 ENCOUNTER — Ambulatory Visit: Payer: Medicare Other | Admitting: Radiation Oncology

## 2022-11-12 DIAGNOSIS — N1831 Chronic kidney disease, stage 3a: Secondary | ICD-10-CM | POA: Diagnosis not present

## 2022-11-12 DIAGNOSIS — E785 Hyperlipidemia, unspecified: Secondary | ICD-10-CM | POA: Diagnosis not present

## 2022-11-12 DIAGNOSIS — D631 Anemia in chronic kidney disease: Secondary | ICD-10-CM | POA: Diagnosis not present

## 2022-11-12 DIAGNOSIS — E875 Hyperkalemia: Secondary | ICD-10-CM | POA: Diagnosis not present

## 2022-11-12 DIAGNOSIS — I10 Essential (primary) hypertension: Secondary | ICD-10-CM | POA: Diagnosis not present

## 2022-11-12 DIAGNOSIS — R8290 Unspecified abnormal findings in urine: Secondary | ICD-10-CM | POA: Diagnosis not present

## 2022-11-12 DIAGNOSIS — R809 Proteinuria, unspecified: Secondary | ICD-10-CM | POA: Diagnosis not present

## 2022-11-12 DIAGNOSIS — E1129 Type 2 diabetes mellitus with other diabetic kidney complication: Secondary | ICD-10-CM | POA: Diagnosis not present

## 2022-11-17 ENCOUNTER — Ambulatory Visit: Payer: Medicare Other | Admitting: Cardiovascular Disease

## 2022-11-17 ENCOUNTER — Encounter: Payer: Self-pay | Admitting: Cardiovascular Disease

## 2022-11-17 VITALS — BP 150/69 | HR 58 | Ht 71.0 in | Wt 182.8 lb

## 2022-11-17 DIAGNOSIS — Z23 Encounter for immunization: Secondary | ICD-10-CM | POA: Diagnosis not present

## 2022-11-17 DIAGNOSIS — E782 Mixed hyperlipidemia: Secondary | ICD-10-CM | POA: Diagnosis not present

## 2022-11-17 DIAGNOSIS — N189 Chronic kidney disease, unspecified: Secondary | ICD-10-CM | POA: Diagnosis not present

## 2022-11-17 DIAGNOSIS — I6523 Occlusion and stenosis of bilateral carotid arteries: Secondary | ICD-10-CM

## 2022-11-17 DIAGNOSIS — I10 Essential (primary) hypertension: Secondary | ICD-10-CM | POA: Diagnosis not present

## 2022-11-17 DIAGNOSIS — I25119 Atherosclerotic heart disease of native coronary artery with unspecified angina pectoris: Secondary | ICD-10-CM

## 2022-11-17 DIAGNOSIS — I50812 Chronic right heart failure: Secondary | ICD-10-CM

## 2022-11-17 DIAGNOSIS — E1165 Type 2 diabetes mellitus with hyperglycemia: Secondary | ICD-10-CM

## 2022-11-17 MED ORDER — LOSARTAN POTASSIUM-HCTZ 50-12.5 MG PO TABS
1.0000 | ORAL_TABLET | Freq: Every day | ORAL | 11 refills | Status: DC
Start: 2022-11-17 — End: 2023-09-14

## 2022-11-17 NOTE — Progress Notes (Signed)
Cardiology Office Note   Date:  11/17/2022   ID:  Erik, Collins 01/12/1946, MRN [id number]  PCP:  Margaretann Loveless, MD  Cardiologist:  Adrian Blackwater, MD      History of Present Illness: Erik Collins is a 77 y.o. male who presents for  Chief Complaint  Patient presents with   Follow-up    3 mo f/u    BP has been running high over 160 systolic but normal diastolic most  of the times.      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   R nose lateral supratip - referred for radiation oncology   Cancer [facility])    hx lung cancer   CHF (congestive heart failure) (HCC)    Complication of anesthesia    knee surgery caused arrythmia requiring a temporary pacemaker   COPD (chronic obstructive pulmonary disease) (HCC)    Coronary artery disease    s/p CABG   Diabetes mellitus without complication (HCC)    Diverticulosis    Duodenitis    Dysrhythmia    during total knee replacement patient required a temporary pacemaker   GERD (gastroesophageal reflux disease)    Hypercholesterolemia    Hyperplastic colon polyp    Hypertension    Internal hemorrhoids    Lung cancer (HCC)    Lung cancer (HCC)    Myocardial infarction (HCC)    09/2009 f/up with Dr. Adrian Blackwater   Sleep apnea      Past Surgical History:  Procedure Laterality Date   BACK SURGERY     CATARACT EXTRACTION W/PHACO Left 11/09/2019   Procedure: CATARACT EXTRACTION PHACO AND INTRAOCULAR LENS PLACEMENT (IOC) LEFT DIABETIC MALYUGIN 5.91 01:01.0 9.6%;  Surgeon: Lockie Mola, MD;  Location: [facility];  Service: Ophthalmology;  Laterality: Left;   CATARACT EXTRACTION W/PHACO Right 12/07/2019   Procedure: CATARACT EXTRACTION PHACO AND INTRAOCULAR LENS PLACEMENT (IOC) RIGHT DIABETIC MALYUGIN 6.38 01:05.1 9.8%;  Surgeon: Lockie Mola, MD;  Location: [facility];  Service: Ophthalmology;  Laterality: Right;   COLONOSCOPY N/A  11/23/2019   Procedure: COLONOSCOPY;  Surgeon: Toledo, Boykin Nearing, MD;  Location: [facility] ENDOSCOPY;  Service: Gastroenterology;  Laterality: N/A;   COLONOSCOPY WITH PROPOFOL N/A 11/09/2014   Procedure: COLONOSCOPY WITH PROPOFOL;  Surgeon: Elnita Maxwell, MD;  Location: [facility] END[facility]COPY;  Service: Endoscopy;  Laterality: N/A;   CORONARY ANGIOPLASTY     CORONARY ARTERY BYPASS GRAFT  09/2009   quadruple, done at [facility]   CORONARY ARTERY BYPASS GRAFT     ESOPHAGOGASTRODUODENOSCOPY (EGD) WITH PROPOFOL N/A 11/09/2014   Procedure: ESOPHAGOGASTRODUODENOSCOPY (EGD) WITH PROPOFOL;  Surgeon: Elnita Maxwell, MD;  Location: [facility] ENDOSCOPY;  Service: Endoscopy;  Laterality: N/A;   left lower lobectomy  09/2009   at [facility] LAMINECTOMY/DECOMPRESSION MICRODISCECTOMY  12/17/2011   Procedure: LUMBAR LAMINECTOMY/DECOMPRESSION MICRODISCECTOMY 1 LEVEL;  Surgeon: Tia Alert, MD;  Location: [facility];  Service: Neurosurgery;  Laterality: Right;  Right Lumbar five-sacral one microdiscectomy   TEMPORARY PACEMAKER Right 07/10/2017   Procedure: TEMPORARY PACEMAKER;  Surgeon: Marcina Millard, MD;  Location: [facility] INVASIVE CV LAB;  Service: Cardiovascular;  Laterality: Right;   TOTAL KNEE ARTHROPLASTY Left 07/08/2017   Procedure: TOTAL KNEE ARTHROPLASTY;  Surgeon: Deeann Saint, MD;  Location: [facility] ORS;  Service: Orthopedics;  Laterality: Left;     Current Outpatient Medications  Medication Sig Dispense Refill   losartan-hydrochlorothiazide (HYZAAR) 50-12.5 MG tablet  Take 1 tablet by mouth daily. 30 tablet 11   acetaminophen (TYLENOL) 650 MG CR tablet Take 1,300 mg by mouth 2 (two) times daily.     aspirin EC 81 MG tablet Take 81 mg by mouth daily. (Patient not taking: Reported on 09/22/2022)     carvedilol (COREG) 12.5 MG tablet Take 1 tablet (12.5 mg total) by mouth 2 (two) times daily with a meal. 180 tablet 3   empagliflozin (JARDIANCE) 10 MG TABS tablet Take 10 mg by mouth daily.     ezetimibe (ZETIA)  10 MG tablet TAKE 1 TABLET BY MOUTH DAILY 90 tablet 1   gabapentin (NEURONTIN) 600 MG tablet TAKE 1 TABLET BY MOUTH TWICE A DAY 180 tablet 3   Insulin Glargine 300 UNIT/ML SOPN Inject 0-40 Units into the skin See admin instructions. Sliding scale insulin per patient  3 units if blood sugar is greater than 150 during the day, if blood sugars are less= no insulin 40 units if blood sugar is greater than 150 at night, if blood sugars are less=no insulin     insulin glargine, 2 Unit Dial, (TOUJEO MAX SOLOSTAR) 300 UNIT/ML Solostar Pen Inject 40 Units into the skin at bedtime. 90 mL 1   meclizine (ANTIVERT) 25 MG tablet Take 1 tablet (25 mg total) by mouth 3 (three) times daily as needed for dizziness. 15 tablet 0   metFORMIN (GLUCOPHAGE) 1000 MG tablet Take 1,000 mg by mouth 2 (two) times daily.     NEXLETOL 180 MG TABS TAKE 1 TABLET BY MOUTH DAILY 90 tablet 0   Omega-3 Fatty Acids (FISH OIL) 1200 MG CAPS Take 1,200 mg by mouth 2 (two) times daily.     omeprazole (PRILOSEC) 20 MG capsule Take 1 capsule (20 mg total) by mouth daily. 90 capsule 0   vitamin B-12 (CYANOCOBALAMIN) 500 MCG tablet Take 500 mcg by mouth daily.     No current facility-administered medications for this visit.    Allergies:   Spironolactone and Statins    Social History:   reports that he quit smoking about 13 years ago. His smoking use included cigarettes. He has never used smokeless tobacco. He reports that he does not drink alcohol and does not use drugs.   Family History:  family history includes Diabetes in his mother; Heart attack in his father; Heart disease in his father and mother; Stroke in his father.    ROS:     Review of Systems  Constitutional: Negative.   HENT: Negative.    Eyes: Negative.   Respiratory: Negative.    Gastrointestinal: Negative.   Genitourinary: Negative.   Musculoskeletal: Negative.   Skin: Negative.   Neurological: Negative.   Endo/Heme/Allergies: Negative.    Psychiatric/Behavioral: Negative.    All other systems reviewed and are negative.     All other systems are reviewed and negative.    PHYSICAL EXAM: VS:  BP (!) 150/69   Pulse (!) 58   Ht 5\' 11"  (1.803 m)   Wt 182 lb 12.8 oz (82.9 kg)   SpO2 90%   BMI 25.50 kg/m  , BMI Body mass index is 25.5 kg/m. Last weight:  Wt Readings from Last 3 Encounters:  11/17/22 182 lb 12.8 oz (82.9 kg)  11/10/22 185 lb (83.9 kg)  09/22/22 180 lb 12.8 oz (82 kg)     Physical Exam Vitals reviewed.  Constitutional:      Appearance: Normal appearance. He is normal weight.  HENT:     Head: Normocephalic.  Nose: Nose normal.     Mouth/Throat:     Mouth: Mucous membranes are moist.  Eyes:     Pupils: Pupils are equal, round, and reactive to light.  Cardiovascular:     Rate and Rhythm: Normal rate and regular rhythm.     Pulses: Normal pulses.     Heart sounds: Normal heart sounds.  Pulmonary:     Effort: Pulmonary effort is normal.  Abdominal:     General: Abdomen is flat. Bowel sounds are normal.  Musculoskeletal:        General: Normal range of motion.     Cervical back: Normal range of motion.  Skin:    General: Skin is warm.  Neurological:     General: No focal deficit present.     Mental Status: He is alert.  Psychiatric:        Mood and Affect: Mood normal.       EKG:   Recent Labs: 09/15/2022: ALT 12; Hemoglobin 12.2; Platelets 244; TSH 2.390 10/06/2022: BUN 23; Creatinine, Ser 1.15; Potassium 5.0; Sodium 144    Lipid Panel    Component Value Date/Time   CHOL 112 09/15/2022 0850   TRIG 74 09/15/2022 0850   HDL 36 (L) 09/15/2022 0850   CHOLHDL 3.1 09/15/2022 0850   LDLCALC 61 09/15/2022 0850      Other studies Reviewed: Additional studies/ records that were reviewed today include:  Review of the above records demonstrates:       No data to display            ASSESSMENT AND PLAN:    ICD-10-CM   1. Essential hypertension  I10  losartan-hydrochlorothiazide (HYZAAR) 50-12.5 MG tablet    PCV ECHOCARDIOGRAM COMPLETE   Uncontrolled hypertension with repeat BP 160/80 on the right arm.  Will change losartan 50 to Hyzaar 12.5/50 as patient has history of hyperkalemia.    2. Mixed hyperlipidemia  E78.2 losartan-hydrochlorothiazide (HYZAAR) 50-12.5 MG tablet    PCV ECHOCARDIOGRAM COMPLETE    3. Bilateral carotid artery stenosis  I65.23 losartan-hydrochlorothiazide (HYZAAR) 50-12.5 MG tablet    PCV ECHOCARDIOGRAM COMPLETE    4. Chronic right-sided congestive heart failure (HCC)  I50.812 losartan-hydrochlorothiazide (HYZAAR) 50-12.5 MG tablet    PCV ECHOCARDIOGRAM COMPLETE   Will get echocardiogram.    5. Coronary artery disease involving native coronary artery of native heart with angina pectoris (HCC)  I25.119 losartan-hydrochlorothiazide (HYZAAR) 50-12.5 MG tablet    PCV ECHOCARDIOGRAM COMPLETE    6. Chronic kidney disease, unspecified CKD stage  N18.9 losartan-hydrochlorothiazide (HYZAAR) 50-12.5 MG tablet    PCV ECHOCARDIOGRAM COMPLETE   Last kidney function checkup revealed BUN/creatinine were normal.  So we will start the patient on Hyzaar 50/12.5 which will prevent hyperkalemia and control bp    7. Type 2 diabetes mellitus with hyperglycemia, without long-term current use of insulin (HCC)  E11.65 losartan-hydrochlorothiazide (HYZAAR) 50-12.5 MG tablet    PCV ECHOCARDIOGRAM COMPLETE    8. Needs flu shot  Z23 Flu Vaccine Trivalent High Dose (Fluad)       Problem List Items Addressed This Visit       Cardiovascular and Mediastinum   Carotid stenosis   Relevant Medications   losartan-hydrochlorothiazide (HYZAAR) 50-12.5 MG tablet   Other Relevant Orders   PCV ECHOCARDIOGRAM COMPLETE   Essential hypertension - Primary   Relevant Medications   losartan-hydrochlorothiazide (HYZAAR) 50-12.5 MG tablet   Other Relevant Orders   PCV ECHOCARDIOGRAM COMPLETE   Congestive heart failure (HCC)   Relevant Medications  losartan-hydrochlorothiazide (HYZAAR) 50-12.5 MG tablet   Other Relevant Orders   PCV ECHOCARDIOGRAM COMPLETE   Coronary disease   Relevant Medications   losartan-hydrochlorothiazide (HYZAAR) 50-12.5 MG tablet   Other Relevant Orders   PCV ECHOCARDIOGRAM COMPLETE     Endocrine   Diabetes (HCC)   Relevant Medications   losartan-hydrochlorothiazide (HYZAAR) 50-12.5 MG tablet   Other Relevant Orders   PCV ECHOCARDIOGRAM COMPLETE     Other   Hyperlipidemia   Relevant Medications   losartan-hydrochlorothiazide (HYZAAR) 50-12.5 MG tablet   Other Relevant Orders   PCV ECHOCARDIOGRAM COMPLETE   Other Visit Diagnoses     Chronic kidney disease, unspecified CKD stage       Last kidney function checkup revealed BUN/creatinine were normal.  So we will start the patient on Hyzaar 50/12.5 which will prevent hyperkalemia and control bp   Relevant Medications   losartan-hydrochlorothiazide (HYZAAR) 50-12.5 MG tablet   Other Relevant Orders   PCV ECHOCARDIOGRAM COMPLETE   Needs flu shot       Relevant Orders   Flu Vaccine Trivalent High Dose (Fluad) (Completed)          Disposition:   Return in about 5 weeks (around 12/22/2022).    Total time spent: 35 minutes  Signed,  Adrian Blackwater, MD  11/17/2022 11:07 AM    [facility]

## 2022-12-01 ENCOUNTER — Ambulatory Visit (INDEPENDENT_AMBULATORY_CARE_PROVIDER_SITE_OTHER): Payer: Medicare Other

## 2022-12-01 DIAGNOSIS — I10 Essential (primary) hypertension: Secondary | ICD-10-CM

## 2022-12-01 DIAGNOSIS — E1165 Type 2 diabetes mellitus with hyperglycemia: Secondary | ICD-10-CM

## 2022-12-01 DIAGNOSIS — I6523 Occlusion and stenosis of bilateral carotid arteries: Secondary | ICD-10-CM

## 2022-12-01 DIAGNOSIS — I361 Nonrheumatic tricuspid (valve) insufficiency: Secondary | ICD-10-CM | POA: Diagnosis not present

## 2022-12-01 DIAGNOSIS — I351 Nonrheumatic aortic (valve) insufficiency: Secondary | ICD-10-CM

## 2022-12-01 DIAGNOSIS — E782 Mixed hyperlipidemia: Secondary | ICD-10-CM

## 2022-12-01 DIAGNOSIS — N189 Chronic kidney disease, unspecified: Secondary | ICD-10-CM

## 2022-12-01 DIAGNOSIS — I25119 Atherosclerotic heart disease of native coronary artery with unspecified angina pectoris: Secondary | ICD-10-CM

## 2022-12-01 DIAGNOSIS — I50812 Chronic right heart failure: Secondary | ICD-10-CM

## 2022-12-01 DIAGNOSIS — I340 Nonrheumatic mitral (valve) insufficiency: Secondary | ICD-10-CM | POA: Diagnosis not present

## 2022-12-03 ENCOUNTER — Ambulatory Visit
Admission: RE | Admit: 2022-12-03 | Discharge: 2022-12-03 | Disposition: A | Discharge status: Home / Self Care | Payer: Medicare Other | Source: Ambulatory Visit | Attending: Radiation Oncology | Admitting: Radiation Oncology

## 2022-12-03 DIAGNOSIS — R911 Solitary pulmonary nodule: Secondary | ICD-10-CM | POA: Diagnosis not present

## 2022-12-03 DIAGNOSIS — I700 Atherosclerosis of aorta: Secondary | ICD-10-CM | POA: Diagnosis not present

## 2022-12-03 DIAGNOSIS — J439 Emphysema, unspecified: Secondary | ICD-10-CM | POA: Diagnosis not present

## 2022-12-03 DIAGNOSIS — C3490 Malignant neoplasm of unspecified part of unspecified bronchus or lung: Secondary | ICD-10-CM | POA: Diagnosis not present

## 2022-12-03 MED ORDER — IOHEXOL 300 MG/ML  SOLN
75.0000 mL | Freq: Once | INTRAMUSCULAR | Status: AC | PRN
  Administered 2022-12-03: 75 mL via INTRAVENOUS

## 2022-12-08 ENCOUNTER — Encounter: Payer: Self-pay | Admitting: Cardiovascular Disease

## 2022-12-08 ENCOUNTER — Ambulatory Visit: Payer: Medicare Other | Admitting: Radiation Oncology

## 2022-12-08 ENCOUNTER — Ambulatory Visit (INDEPENDENT_AMBULATORY_CARE_PROVIDER_SITE_OTHER): Payer: Medicare Other | Admitting: Cardiovascular Disease

## 2022-12-08 VITALS — BP 122/80 | HR 51 | Ht 71.0 in | Wt 183.0 lb

## 2022-12-08 DIAGNOSIS — I50812 Chronic right heart failure: Secondary | ICD-10-CM

## 2022-12-08 DIAGNOSIS — I6523 Occlusion and stenosis of bilateral carotid arteries: Secondary | ICD-10-CM

## 2022-12-08 DIAGNOSIS — I10 Essential (primary) hypertension: Secondary | ICD-10-CM

## 2022-12-08 DIAGNOSIS — I25119 Atherosclerotic heart disease of native coronary artery with unspecified angina pectoris: Secondary | ICD-10-CM

## 2022-12-08 DIAGNOSIS — E1165 Type 2 diabetes mellitus with hyperglycemia: Secondary | ICD-10-CM

## 2022-12-08 NOTE — Progress Notes (Signed)
Cardiology Office Note   Date:  12/08/2022   ID:  [name], [name] [birth year], MRN [id number]  PCP:  Margaretann Loveless, MD  Cardiologist:  Adrian Blackwater, MD      History of Present Illness: Erik Collins is a 77 y.o. male who presents for No chief complaint on file.   Feeling fine      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   R nose lateral supratip - referred for radiation oncology   Cancer (HCC)    hx lung cancer   CHF (congestive heart failure) (HCC)    Complication of anesthesia    knee surgery caused arrythmia requiring a temporary pacemaker   COPD (chronic obstructive pulmonary disease) (HCC)    Coronary artery disease    s/p CABG   Diabetes mellitus without complication (HCC)    Diverticulosis    Duodenitis    Dysrhythmia    during total knee replacement patient required a temporary pacemaker   GERD (gastroesophageal reflux disease)    Hypercholesterolemia    Hyperplastic colon polyp    Hypertension    Internal hemorrhoids    Lung cancer (HCC)    Lung cancer (HCC)    Myocardial infarction (HCC)    09/2009 f/up with Dr. Adrian Blackwater   Sleep apnea      Past Surgical History:  Procedure Laterality Date   BACK SURGERY     CATARACT EXTRACTION W/PHACO Left 11/09/2019   Procedure: CATARACT EXTRACTION PHACO AND INTRAOCULAR LENS PLACEMENT (IOC) LEFT DIABETIC MALYUGIN 5.91 01:01.0 9.6%;  Surgeon: Lockie Mola, MD;  Location: [facility];  Service: Ophthalmology;  Laterality: Left;   CATARACT EXTRACTION W/PHACO Right 12/07/2019   Procedure: CATARACT EXTRACTION PHACO AND INTRAOCULAR LENS PLACEMENT (IOC) RIGHT DIABETIC MALYUGIN 6.38 01:05.1 9.8%;  Surgeon: Lockie Mola, MD;  Location: [facility];  Service: Ophthalmology;  Laterality: Right;   COLONOSCOPY N/A 11/23/2019   Procedure: COLONOSCOPY;  Surgeon: Toledo, Boykin Nearing, MD;  Location: [facility] ENDOSCOPY;  Service:  Gastroenterology;  Laterality: N/A;   COLONOSCOPY WITH PROPOFOL N/A 11/09/2014   Procedure: COLONOSCOPY WITH PROPOFOL;  Surgeon: Elnita Maxwell, MD;  Location: [facility] ENDOSCOPY;  Service: Endoscopy;  Laterality: N/A;   CORONARY ANGIOPLASTY     CORONARY ARTERY BYPASS GRAFT  09/2009   quadruple, done at [facility]   CORONARY ARTERY BYPASS GRAFT     ESOPHAGOGASTRODUODENOSCOPY (EGD) WITH PROPOFOL N/A 11/09/2014   Procedure: ESOPHAGOGASTRODUODENOSCOPY (EGD) WITH PROPOFOL;  Surgeon: Elnita Maxwell, MD;  Location: [facility] ENDOSCOPY;  Service: Endoscopy;  Laterality: N/A;   left lower lobectomy  09/2009   at [facility] LAMINECTOMY/DECOMPRESSION MICRODISCECTOMY  12/17/2011   Procedure: LUMBAR LAMINECTOMY/DECOMPRESSION MICRODISCECTOMY 1 LEVEL;  Surgeon: Tia Alert, MD;  Location: [facility];  Service: Neurosurgery;  Laterality: Right;  Right Lumbar five-sacral one microdiscectomy   TEMPORARY PACEMAKER Right 07/10/2017   Procedure: TEMPORARY PACEMAKER;  Surgeon: Marcina Millard, MD;  Location: [facility] INVASIVE CV LAB;  Service: Cardiovascular;  Laterality: Right;   TOTAL KNEE ARTHROPLASTY Left 07/08/2017   Procedure: TOTAL KNEE ARTHROPLASTY;  Surgeon: Deeann Saint, MD;  Location: [facility] ORS;  Service: Orthopedics;  Laterality: Left;     Current Outpatient Medications  Medication Sig Dispense Refill   acetaminophen (TYLENOL) 650 MG CR tablet Take 1,300 mg by mouth 2 (two) times daily.     aspirin EC 81 MG tablet Take 81 mg by mouth daily. (Patient  not taking: Reported on 09/22/2022)     carvedilol (COREG) 12.5 MG tablet Take 1 tablet (12.5 mg total) by mouth 2 (two) times daily with a meal. 180 tablet 3   empagliflozin (JARDIANCE) 10 MG TABS tablet Take 10 mg by mouth daily.     ezetimibe (ZETIA) 10 MG tablet TAKE 1 TABLET BY MOUTH DAILY 90 tablet 1   gabapentin (NEURONTIN) 600 MG tablet TAKE 1 TABLET BY MOUTH TWICE A DAY 180 tablet 3   Insulin Glargine 300 UNIT/ML SOPN Inject 0-40 Units into the  skin See admin instructions. Sliding scale insulin per patient  3 units if blood sugar is greater than 150 during the day, if blood sugars are less= no insulin 40 units if blood sugar is greater than 150 at night, if blood sugars are less=no insulin     insulin glargine, 2 Unit Dial, (TOUJEO MAX SOLOSTAR) 300 UNIT/ML Solostar Pen Inject 40 Units into the skin at bedtime. 90 mL 1   losartan-hydrochlorothiazide (HYZAAR) 50-12.5 MG tablet Take 1 tablet by mouth daily. 30 tablet 11   meclizine (ANTIVERT) 25 MG tablet Take 1 tablet (25 mg total) by mouth 3 (three) times daily as needed for dizziness. 15 tablet 0   metFORMIN (GLUCOPHAGE) 1000 MG tablet Take 1,000 mg by mouth 2 (two) times daily.     NEXLETOL 180 MG TABS TAKE 1 TABLET BY MOUTH DAILY 90 tablet 0   Omega-3 Fatty Acids (FISH OIL) 1200 MG CAPS Take 1,200 mg by mouth 2 (two) times daily.     omeprazole (PRILOSEC) 20 MG capsule Take 1 capsule (20 mg total) by mouth daily. 90 capsule 0   vitamin B-12 (CYANOCOBALAMIN) 500 MCG tablet Take 500 mcg by mouth daily.     No current facility-administered medications for this visit.    Allergies:   Spironolactone and Statins    Social History:   reports that he quit smoking about 13 years ago. His smoking use included cigarettes. He has never used smokeless tobacco. He reports that he does not drink alcohol and does not use drugs.   Family History:  family history includes Diabetes in his mother; Heart attack in his father; Heart disease in his father and mother; Stroke in his father.    ROS:     Review of Systems  Constitutional: Negative.   HENT: Negative.    Eyes: Negative.   Respiratory: Negative.    Gastrointestinal: Negative.   Genitourinary: Negative.   Musculoskeletal: Negative.   Skin: Negative.   Neurological: Negative.   Endo/Heme/Allergies: Negative.   Psychiatric/Behavioral: Negative.    All other systems reviewed and are negative.     All other systems are reviewed and  negative.    PHYSICAL EXAM: VS:  BP 122/80   Pulse (!) 51   Ht 5\' 11"  (1.803 m)   Wt 183 lb (83 kg)   SpO2 98%   BMI 25.52 kg/m  , BMI Body mass index is 25.52 kg/m. Last weight:  Wt Readings from Last 3 Encounters:  12/08/22 183 lb (83 kg)  11/17/22 182 lb 12.8 oz (82.9 kg)  11/10/22 185 lb (83.9 kg)     Physical Exam Vitals reviewed.  Constitutional:      Appearance: Normal appearance. He is normal weight.  HENT:     Head: Normocephalic.     Nose: Nose normal.     Mouth/Throat:     Mouth: Mucous membranes are moist.  Eyes:     Pupils: Pupils are equal, round,  and reactive to light.  Cardiovascular:     Rate and Rhythm: Normal rate and regular rhythm.     Pulses: Normal pulses.     Heart sounds: Normal heart sounds.  Pulmonary:     Effort: Pulmonary effort is normal.  Abdominal:     General: Abdomen is flat. Bowel sounds are normal.  Musculoskeletal:        General: Normal range of motion.     Cervical back: Normal range of motion.  Skin:    General: Skin is warm.  Neurological:     General: No focal deficit present.     Mental Status: He is alert.  Psychiatric:        Mood and Affect: Mood normal.       EKG:   Recent Labs: 09/15/2022: ALT 12; Hemoglobin 12.2; Platelets 244; TSH 2.390 10/06/2022: BUN 23; Creatinine, Ser 1.15; Potassium 5.0; Sodium 144    Lipid Panel    Component Value Date/Time   CHOL 112 09/15/2022 0850   TRIG 74 09/15/2022 0850   HDL 36 (L) 09/15/2022 0850   CHOLHDL 3.1 09/15/2022 0850   LDLCALC 61 09/15/2022 0850      Other studies Reviewed: Additional studies/ records that were reviewed today include:  Review of the above records demonstrates:       No data to display            ASSESSMENT AND PLAN:    ICD-10-CM   1. Essential hypertension  I10    repeat BP 140/60, will keep hyzaar at current dosage    2. Coronary artery disease involving native coronary artery of native heart with angina pectoris (HCC)   I25.119     3. Chronic right-sided congestive heart failure (HCC)  I50.812     4. Bilateral carotid artery stenosis  I65.23        Problem List Items Addressed This Visit       Cardiovascular and Mediastinum   Carotid stenosis   Essential hypertension - Primary   Congestive heart failure (HCC)   Coronary disease       Disposition:   Return in about 3 months (around 03/10/2023).    Total time spent: 30 minutes  Signed,  Adrian Blackwater, MD  12/08/2022 9:30 AM    [facility]

## 2022-12-10 ENCOUNTER — Ambulatory Visit: Payer: Medicare Other | Admitting: Radiation Oncology

## 2022-12-10 DIAGNOSIS — R809 Proteinuria, unspecified: Secondary | ICD-10-CM | POA: Diagnosis not present

## 2022-12-10 DIAGNOSIS — D631 Anemia in chronic kidney disease: Secondary | ICD-10-CM | POA: Diagnosis not present

## 2022-12-10 DIAGNOSIS — I10 Essential (primary) hypertension: Secondary | ICD-10-CM | POA: Diagnosis not present

## 2022-12-10 DIAGNOSIS — E1129 Type 2 diabetes mellitus with other diabetic kidney complication: Secondary | ICD-10-CM | POA: Diagnosis not present

## 2022-12-10 DIAGNOSIS — E875 Hyperkalemia: Secondary | ICD-10-CM | POA: Diagnosis not present

## 2022-12-10 DIAGNOSIS — N1831 Chronic kidney disease, stage 3a: Secondary | ICD-10-CM | POA: Diagnosis not present

## 2022-12-10 DIAGNOSIS — E785 Hyperlipidemia, unspecified: Secondary | ICD-10-CM | POA: Diagnosis not present

## 2022-12-11 ENCOUNTER — Other Ambulatory Visit: Payer: Self-pay | Admitting: *Deleted

## 2022-12-11 ENCOUNTER — Encounter: Payer: Self-pay | Admitting: Radiation Oncology

## 2022-12-11 ENCOUNTER — Ambulatory Visit
Admission: RE | Admit: 2022-12-11 | Discharge: 2022-12-11 | Disposition: A | Discharge status: Home / Self Care | Payer: Medicare Other | Source: Ambulatory Visit | Attending: Radiation Oncology | Admitting: Radiation Oncology

## 2022-12-11 VITALS — BP 128/62 | HR 62 | Temp 98.2°F | Resp 16 | Ht 71.5 in | Wt 185.5 lb

## 2022-12-11 DIAGNOSIS — R911 Solitary pulmonary nodule: Secondary | ICD-10-CM | POA: Diagnosis not present

## 2022-12-11 NOTE — Progress Notes (Signed)
Radiation Oncology Follow up Note  Name: Erik Collins   Date:   12/11/2022 MRN:  [id number] DOB: [birth year]    This 77 y.o. male presents to the clinic today for follow-up.  Of abnormality found on his last CT scan which has resolved on his newer CT scan  REFERRING PROVIDER: Margaretann Loveless, MD  HPI  The patient, a 77 year old with a history of basal cell carcinoma of the nose and a right upper lobe lesion, recently completed electron beam therapy and SBRT respectively. He had a CT scan in June showing stable post-radiation changes in the right upper lobe and a new 11mm nodular lesion in the left lower lobe, suspected to be inflammatory. A repeat CT scan showed resolution of the left lower lobe lesion, suggesting it was likely inflammatory or infectious in nature. The patient had a prior left lower lobectomy.  The patient reports a persistent cough, which has been occurring more frequently. He has a history of seeing a pulmonologist, Dr. Meredeth Ide, but discontinued these visits.  Have asked him to return to Dr. Meredeth Ide should his cough persist or to notify me.        COMPLICATIONS OF TREATMENT: none  FOLLOW UP COMPLIANCE: keeps appointments   PHYSICAL EXAM:  BP 128/62   Pulse 62   Temp 98.2 F (36.8 C) (Tympanic)   Resp 16   Ht 5' 11.5" (1.816 m)   Wt 185 lb 8 oz (84.1 kg)   BMI 25.51 kg/m area of his nose looks intact without evidence of mass nodularity or evidence of residual basal cell carcinoma Well-developed well-nourished patient in NAD. HEENT reveals PERLA, EOMI, discs not visualized.  Oral cavity is clear. No oral mucosal lesions are identified. Neck is clear without evidence of cervical or supraclavicular adenopathy. Lungs are clear to A&P. Cardiac examination is essentially unremarkable with regular rate and rhythm without murmur rub or thrill. Abdomen is benign with no organomegaly or masses noted. Motor sensory and DTR levels are equal and symmetric in the upper and lower  extremities. Cranial nerves II through XII are grossly intact. Proprioception is intact. No peripheral adenopathy or edema is identified. No motor or sensory levels are noted. Crude visual fields are within normal range.  RADIOLOGY RESULTS: CT scans reviewed compatible with above-stated findings  PLAN: Present time of asked to see him back in 1 year for follow-up with repeat CT scan at that time of asked him to be followed by his dermatologist for his basal cell carcinoma of the nose.  Patient is to call with any concerns.  I have also suggested some Mucinex for his cough.  I would like to take this opportunity to thank you for allowing me to participate in the care of your patient.Carmina Miller, MD

## 2022-12-12 ENCOUNTER — Other Ambulatory Visit: Payer: Self-pay | Admitting: Family

## 2022-12-18 ENCOUNTER — Telehealth: Payer: Self-pay

## 2022-12-18 NOTE — Telephone Encounter (Signed)
Please call pt and let him know his patient assistance is here and ready for pick up

## 2022-12-22 ENCOUNTER — Other Ambulatory Visit: Payer: Medicare Other

## 2022-12-22 ENCOUNTER — Encounter: Payer: Self-pay | Admitting: Internal Medicine

## 2022-12-22 ENCOUNTER — Ambulatory Visit (INDEPENDENT_AMBULATORY_CARE_PROVIDER_SITE_OTHER): Payer: Medicare Other | Admitting: Internal Medicine

## 2022-12-22 VITALS — BP 140/62 | HR 55 | Ht 71.5 in | Wt 183.8 lb

## 2022-12-22 DIAGNOSIS — E1165 Type 2 diabetes mellitus with hyperglycemia: Secondary | ICD-10-CM

## 2022-12-22 DIAGNOSIS — I25119 Atherosclerotic heart disease of native coronary artery with unspecified angina pectoris: Secondary | ICD-10-CM | POA: Diagnosis not present

## 2022-12-22 DIAGNOSIS — N183 Chronic kidney disease, stage 3 unspecified: Secondary | ICD-10-CM

## 2022-12-22 DIAGNOSIS — I6523 Occlusion and stenosis of bilateral carotid arteries: Secondary | ICD-10-CM

## 2022-12-22 DIAGNOSIS — E782 Mixed hyperlipidemia: Secondary | ICD-10-CM

## 2022-12-22 DIAGNOSIS — E1122 Type 2 diabetes mellitus with diabetic chronic kidney disease: Secondary | ICD-10-CM | POA: Diagnosis not present

## 2022-12-22 DIAGNOSIS — I10 Essential (primary) hypertension: Secondary | ICD-10-CM

## 2022-12-22 LAB — POCT CBG (FASTING - GLUCOSE)-MANUAL ENTRY: Glucose Fasting, POC: 129 mg/dL — AB (ref 70–99)

## 2022-12-22 MED ORDER — EMPAGLIFLOZIN 10 MG PO TABS
10.0000 mg | ORAL_TABLET | Freq: Every day | ORAL | 3 refills | Status: DC
Start: 2022-12-22 — End: 2023-04-27

## 2022-12-22 MED ORDER — NEXLETOL 180 MG PO TABS
1.0000 | ORAL_TABLET | Freq: Every day | ORAL | 3 refills | Status: DC
Start: 2022-12-22 — End: 2023-01-03

## 2022-12-22 NOTE — Progress Notes (Signed)
Established Patient Office Visit  Subjective:  Patient ID: Erik Collins, male    DOB: 1945-12-31  Age: 77 y.o. MRN: [id number]  Chief Complaint  Patient presents with   Follow-up    3 month follow up    Patient is here for his follow-up, accompanied by his wife.  He had his fasting labs done earlier today and the results are not available.  He has completed his radiation treatment for the lung nodule.  Blood pressure is a little high this morning but just took his medications.  Generally feels well and has no new complaints.  Needs refills on medications. Will call him with lab results once available.    No other concerns at this time.   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   R nose lateral supratip - referred for radiation oncology   Cancer [facility])    hx lung cancer   CHF (congestive heart failure) (HCC)    Complication of anesthesia    knee surgery caused arrythmia requiring a temporary pacemaker   COPD (chronic obstructive pulmonary disease) (HCC)    Coronary artery disease    s/p CABG   Diabetes mellitus without complication (HCC)    Diverticulosis    Duodenitis    Dysrhythmia    during total knee replacement patient required a temporary pacemaker   GERD (gastroesophageal reflux disease)    Hypercholesterolemia    Hyperplastic colon polyp    Hypertension    Internal hemorrhoids    Lung cancer (HCC)    Lung cancer (HCC)    Myocardial infarction (HCC)    09/2009 f/up with Dr. Adrian Blackwater   Sleep apnea     Past Surgical History:  Procedure Laterality Date   BACK SURGERY     CATARACT EXTRACTION W/PHACO Left 11/09/2019   Procedure: CATARACT EXTRACTION PHACO AND INTRAOCULAR LENS PLACEMENT (IOC) LEFT DIABETIC MALYUGIN 5.91 01:01.0 9.6%;  Surgeon: Lockie Mola, MD;  Location: [facility];  Service: Ophthalmology;  Laterality: Left;   CATARACT EXTRACTION W/PHACO Right 12/07/2019    Procedure: CATARACT EXTRACTION PHACO AND INTRAOCULAR LENS PLACEMENT (IOC) RIGHT DIABETIC MALYUGIN 6.38 01:05.1 9.8%;  Surgeon: Lockie Mola, MD;  Location: [facility];  Service: Ophthalmology;  Laterality: Right;   COLONOSCOPY N/A 11/23/2019   Procedure: COLONOSCOPY;  Surgeon: Toledo, Boykin Nearing, MD;  Location: [facility] ENDOSCOPY;  Service: Gastroenterology;  Laterality: N/A;   COLONOSCOPY WITH PROPOFOL N/A 11/09/2014   Procedure: COLONOSCOPY WITH PROPOFOL;  Surgeon: Elnita Maxwell, MD;  Location: [facility] ENDOSCOPY;  Service: Endoscopy;  Laterality: N/A;   CORONARY ANGIOPLASTY     CORONARY ARTERY BYPASS GRAFT  09/2009   quadruple, done at [facility]   CORONARY ARTERY BYPASS GRAFT     ESOPHAGOGASTRODUODENOSCOPY (EGD) WITH PROPOFOL N/A 11/09/2014   Procedure: ESOPHAGOGASTRODUODENOSCOPY (EGD) WITH PROPOFOL;  Surgeon: Elnita Maxwell, MD;  Location: [facility] ENDOSCOPY;  Service: Endoscopy;  Laterality: N/A;   left lower lobectomy  09/2009   at [facility] LAMINECTOMY/DECOMPRESSION MICRODISCECTOMY  12/17/2011   Procedure: LUMBAR LAMINECTOMY/DECOMPRESSION MICRODISCECTOMY 1 LEVEL;  Surgeon: Tia Alert, MD;  Location: [facility];  Service: Neurosurgery;  Laterality: Right;  Right Lumbar five-sacral one microdiscectomy   TEMPORARY PACEMAKER Right 07/10/2017   Procedure: TEMPORARY PACEMAKER;  Surgeon: Marcina Millard, MD;  Location: [facility] INVASIVE CV LAB;  Service: Cardiovascular;  Laterality: Right;   TOTAL KNEE ARTHROPLASTY Left 07/08/2017   Procedure: TOTAL KNEE ARTHROPLASTY;  Surgeon: Deeann Saint, MD;  Location: [facility] ORS;  Service: Orthopedics;  Laterality: Left;    Social History   Socioeconomic History   Marital status: Married    Spouse name: Not on file   Number of children: Not on file   Years of education: Not on file   Highest education level: Not on file  Occupational History   Not on file  Tobacco Use   Smoking status: Former    Current packs/day: 0.00    Types:  Cigarettes    Quit date: 09/19/2009    Years since quitting: 13.2   Smokeless tobacco: Never  Vaping Use   Vaping status: Never Used  Substance and Sexual Activity   Alcohol use: No   Drug use: No   Sexual activity: Not on file  Other Topics Concern   Not on file  Social History Narrative   Not on file   Social Determinants of Health   Financial Resource Strain: Not on file  Food Insecurity: Not on file  Transportation Needs: Not on file  Physical Activity: Not on file  Stress: Not on file  Social Connections: Not on file  Intimate Partner Violence: Not on file    Family History  Problem Relation Age of Onset   Heart disease Mother    Diabetes Mother    Heart disease Father    Stroke Father    Heart attack Father     Allergies  Allergen Reactions   Spironolactone    Statins     Review of Systems  Constitutional: Negative.  Negative for chills, fever, malaise/fatigue and weight loss.  HENT: Negative.  Negative for congestion, hearing loss, nosebleeds and sore throat.   Eyes: Negative.   Respiratory: Negative.  Negative for cough, shortness of breath and stridor.   Cardiovascular: Negative.  Negative for chest pain, palpitations and leg swelling.  Gastrointestinal: Negative.  Negative for abdominal pain, constipation, diarrhea, heartburn, nausea and vomiting.  Genitourinary: Negative.  Negative for dysuria and flank pain.  Musculoskeletal: Negative.  Negative for joint pain and myalgias.  Skin: Negative.   Neurological: Negative.  Negative for dizziness and headaches.  Endo/Heme/Allergies: Negative.   Psychiatric/Behavioral: Negative.  Negative for depression and suicidal ideas. The patient is not nervous/anxious.        Objective:   BP (!) 140/62   Pulse (!) 55   Ht 5' 11.5" (1.816 m)   Wt 183 lb 12.8 oz (83.4 kg)   SpO2 97%   BMI 25.28 kg/m   Vitals:   12/22/22 0857  BP: (!) 140/62  Pulse: (!) 55  Height: 5' 11.5" (1.816 m)  Weight: 183 lb 12.8  oz (83.4 kg)  SpO2: 97%  BMI (Calculated): 25.28    Physical Exam Vitals and nursing note reviewed.  Constitutional:      General: He is not in acute distress.    Appearance: Normal appearance.  HENT:     Head: Normocephalic and atraumatic.     Nose: Nose normal.     Mouth/Throat:     Mouth: Mucous membranes are moist.     Pharynx: Oropharynx is clear.  Eyes:     Conjunctiva/sclera: Conjunctivae normal.     Pupils: Pupils are equal, round, and reactive to light.  Cardiovascular:     Rate and Rhythm: Normal rate and regular rhythm.     Pulses: Normal pulses.     Heart sounds: Normal heart sounds.  Pulmonary:     Effort: Pulmonary effort is normal.  Breath sounds: Normal breath sounds. No wheezing, rhonchi or rales.  Abdominal:     General: Bowel sounds are normal.     Palpations: Abdomen is soft. There is no mass.     Tenderness: There is no abdominal tenderness. There is no right CVA tenderness, left CVA tenderness, guarding or rebound.     Hernia: No hernia is present.  Musculoskeletal:        General: Normal range of motion.     Cervical back: Normal range of motion.     Right lower leg: No edema.     Left lower leg: No edema.  Skin:    General: Skin is warm and dry.     Findings: No rash.  Neurological:     General: No focal deficit present.     Mental Status: He is alert and oriented to person, place, and time.  Psychiatric:        Mood and Affect: Mood normal.        Behavior: Behavior normal.        Judgment: Judgment normal.      Results for orders placed or performed in visit on 12/22/22  POCT CBG (Fasting - Glucose)  Result Value Ref Range   Glucose Fasting, POC 129 (A) 70 - 99 mg/dL    Recent Results (from the past 2160 hour(s))  Rad Onc Aria Session Summary     Status: None   Collection Time: 09/24/22  2:11 PM  Result Value Ref Range   Course ID [email or web address]    Course Intent Curative    Course Start Date 08/25/2022    Session Number 17    Course  First Treatment Date 09/01/2022  3:03 PM    Course Last Treatment Date 09/24/2022  2:11 PM    Course Elapsed Days 23    Reference Point ID Rt Nose DP    Reference Point Dosage Given to Date 34 Gy   Reference Point Session Dosage Given 2 Gy   Plan ID HN_NOSE_BO    Plan Name HN_NB    Plan Fractions Treated to Date 17    Plan Total Fractions Prescribed 25    Plan Prescribed Dose Per Fraction 2 Gy   Plan Total Prescribed Dose 50.000000 Gy   Plan Primary Reference Point Rt Nose DP   Rad Onc Aria Session Summary     Status: None   Collection Time: 09/25/22  2:24 PM  Result Value Ref Range   Course ID [email or web address]_HN    Course Intent Curative    Course Start Date 08/25/2022    Session Number 18    Course First Treatment Date 09/01/2022  3:03 PM    Course Last Treatment Date 09/25/2022  2:23 PM    Course Elapsed Days 24    Reference Point ID Rt Nose DP    Reference Point Dosage Given to Date 36 Gy   Reference Point Session Dosage Given 2 Gy   Plan ID HN_NOSE_BO    Plan Name HN_NB    Plan Fractions Treated to Date 18    Plan Total Fractions Prescribed 25    Plan Prescribed Dose Per Fraction 2 Gy   Plan Total Prescribed Dose 50.000000 Gy   Plan Primary Reference Point Rt Nose DP   Rad Onc Aria Session Summary     Status: None   Collection Time: 09/26/22 11:58 AM  Result Value Ref Range   Course ID [email or web address]_HN    Course Intent Curative    Course Start Date  08/25/2022    Session Number 19    Course First Treatment Date 09/01/2022  3:03 PM    Course Last Treatment Date 09/26/2022 11:58 AM    Course Elapsed Days 25    Reference Point ID Rt Nose DP    Reference Point Dosage Given to Date 38 Gy   Reference Point Session Dosage Given 2 Gy   Plan ID HN_NOSE_BO    Plan Name HN_NB    Plan Fractions Treated to Date 19    Plan Total Fractions Prescribed 25    Plan Prescribed Dose Per Fraction 2 Gy   Plan Total Prescribed Dose 50.000000 Gy   Plan Primary Reference Point Rt Nose DP   Basic metabolic panel      Status: Abnormal   Collection Time: 09/29/22  9:56 AM  Result Value Ref Range   Glucose 111 (H) 70 - 99 mg/dL   BUN 34 (H) 8 - 27 mg/dL   Creatinine, Ser 5.63 (H) 0.76 - 1.27 mg/dL   eGFR 43 (L) >87 FI/EPP/2.95   BUN/Creatinine Ratio 21 10 - 24   Sodium 145 (H) 134 - 144 mmol/L   Potassium 6.1 (H) 3.5 - 5.2 mmol/L   Chloride 110 (H) 96 - 106 mmol/L   CO2 18 (L) 20 - 29 mmol/L   Calcium 9.2 8.6 - 10.2 mg/dL  Rad Onc Aria Session Summary     Status: None   Collection Time: 09/29/22  2:06 PM  Result Value Ref Range   Course ID [email or web address]_HN    Course Intent Curative    Course Start Date 08/25/2022    Session Number 20    Course First Treatment Date 09/01/2022  3:03 PM    Course Last Treatment Date 09/29/2022  2:06 PM    Course Elapsed Days 28    Reference Point ID Rt Nose DP    Reference Point Dosage Given to Date 40 Gy   Reference Point Session Dosage Given 2 Gy   Plan ID HN_NOSE_BO    Plan Name HN_NB    Plan Fractions Treated to Date 20    Plan Total Fractions Prescribed 25    Plan Prescribed Dose Per Fraction 2 Gy   Plan Total Prescribed Dose 50.000000 Gy   Plan Primary Reference Point Rt Nose DP   Rad Onc Aria Session Summary     Status: None   Collection Time: 09/30/22  1:56 PM  Result Value Ref Range   Course ID [email or web address]_HN    Course Intent Curative    Course Start Date 08/25/2022    Session Number 21    Course First Treatment Date 09/01/2022  3:03 PM    Course Last Treatment Date 09/30/2022  1:55 PM    Course Elapsed Days 29    Reference Point ID Rt Nose DP    Reference Point Dosage Given to Date 42 Gy   Reference Point Session Dosage Given 2 Gy   Plan ID HN_NOSE_BO    Plan Name HN_NB    Plan Fractions Treated to Date 21    Plan Total Fractions Prescribed 25    Plan Prescribed Dose Per Fraction 2 Gy   Plan Total Prescribed Dose 50.000000 Gy   Plan Primary Reference Point Rt Nose DP   Rad Onc Aria Session Summary     Status: None   Collection Time: 10/01/22  1:51 PM  Result Value  Ref Range   Course ID C2_HN    Course Intent Curative    Course Start Date  08/25/2022    Session Number 22    Course First Treatment Date 09/01/2022  3:03 PM    Course Last Treatment Date 10/01/2022  1:50 PM    Course Elapsed Days 30    Reference Point ID Rt Nose DP    Reference Point Dosage Given to Date 44 Gy   Reference Point Session Dosage Given 2 Gy   Plan ID HN_NOSE_BO    Plan Name HN_NB    Plan Fractions Treated to Date 22    Plan Total Fractions Prescribed 25    Plan Prescribed Dose Per Fraction 2 Gy   Plan Total Prescribed Dose 50.000000 Gy   Plan Primary Reference Point Rt Nose DP   Rad Onc Aria Session Summary     Status: None   Collection Time: 10/02/22  2:06 PM  Result Value Ref Range   Course ID C2_HN    Course Intent Curative    Course Start Date 08/25/2022    Session Number 23    Course First Treatment Date 09/01/2022  3:03 PM    Course Last Treatment Date 10/02/2022  2:06 PM    Course Elapsed Days 31    Reference Point ID Rt Nose DP    Reference Point Dosage Given to Date 46 Gy   Reference Point Session Dosage Given 2 Gy   Plan ID HN_NOSE_BO    Plan Name HN_NB    Plan Fractions Treated to Date 23    Plan Total Fractions Prescribed 25    Plan Prescribed Dose Per Fraction 2 Gy   Plan Total Prescribed Dose 50.000000 Gy   Plan Primary Reference Point Rt Nose DP   Rad Onc Aria Session Summary     Status: None   Collection Time: 10/03/22 11:54 AM  Result Value Ref Range   Course ID C2_HN    Course Intent Curative    Course Start Date 08/25/2022    Session Number 24    Course First Treatment Date 09/01/2022  3:03 PM    Course Last Treatment Date 10/03/2022 11:54 AM    Course Elapsed Days 32    Reference Point ID Rt Nose DP    Reference Point Dosage Given to Date 48 Gy   Reference Point Session Dosage Given 2 Gy   Plan ID HN_NOSE_BO    Plan Name HN_NB    Plan Fractions Treated to Date 24    Plan Total Fractions Prescribed 25    Plan Prescribed Dose Per Fraction 2  Gy   Plan Total Prescribed Dose 50.000000 Gy   Plan Primary Reference Point Rt Nose DP   Rad Onc Aria Session Summary     Status: None   Collection Time: 10/06/22  8:11 AM  Result Value Ref Range   Course ID C2_HN    Course Intent Curative    Course Start Date 08/25/2022    Session Number 25    Course First Treatment Date 09/01/2022  3:03 PM    Course Last Treatment Date 10/06/2022  8:10 AM    Course Elapsed Days 35    Reference Point ID Rt Nose DP    Reference Point Dosage Given to Date 50 Gy   Reference Point Session Dosage Given 2 Gy   Plan ID HN_NOSE_BO    Plan Name HN_NB    Plan Fractions Treated to Date 25    Plan Total Fractions Prescribed 25    Plan Prescribed Dose Per Fraction 2 Gy   Plan Total Prescribed Dose 50.000000 Gy  Plan Primary Reference Point Rt Nose DP   Basic metabolic panel     Status: Abnormal   Collection Time: 10/06/22  9:08 AM  Result Value Ref Range   Glucose 98 70 - 99 mg/dL   BUN 23 8 - 27 mg/dL   Creatinine, Ser 1.61 0.76 - 1.27 mg/dL   eGFR 65 >09 UE/AVW/0.98   BUN/Creatinine Ratio 20 10 - 24   Sodium 144 134 - 144 mmol/L   Potassium 5.0 3.5 - 5.2 mmol/L   Chloride 109 (H) 96 - 106 mmol/L   CO2 21 20 - 29 mmol/L   Calcium 9.0 8.6 - 10.2 mg/dL  POCT CBG (Fasting - Glucose)     Status: Abnormal   Collection Time: 12/22/22  9:00 AM  Result Value Ref Range   Glucose Fasting, POC 129 (A) 70 - 99 mg/dL      Assessment & Plan:  Patient is to monitor his blood pressure at home as well.  Continue all medications.  Will call him with lab results once available. Problem List Items Addressed This Visit     Carotid stenosis   Relevant Medications   Bempedoic Acid (NEXLETOL) 180 MG TABS   Essential hypertension   Relevant Medications   Bempedoic Acid (NEXLETOL) 180 MG TABS   Hyperlipidemia   Relevant Medications   Bempedoic Acid (NEXLETOL) 180 MG TABS   Diabetes mellitus with stage 3 chronic kidney disease (HCC) - Primary   Relevant Medications    empagliflozin (JARDIANCE) 10 MG TABS tablet   Coronary disease   Relevant Medications   Bempedoic Acid (NEXLETOL) 180 MG TABS    Return in about 4 months (around 04/21/2023).   Total time spent: 30 minutes  Margaretann Loveless, MD  12/22/2022   This document may have been prepared by [facility] Voice Recognition software and as such may include unintentional dictation errors.

## 2022-12-23 LAB — CMP14+EGFR
ALT: 12 [IU]/L (ref 0–44)
AST: 18 [IU]/L (ref 0–40)
Albumin: 4.6 g/dL (ref 3.8–4.8)
Alkaline Phosphatase: 49 [IU]/L (ref 44–121)
BUN/Creatinine Ratio: 28 — ABNORMAL HIGH (ref 10–24)
BUN: 29 mg/dL — ABNORMAL HIGH (ref 8–27)
Bilirubin Total: 0.3 mg/dL (ref 0.0–1.2)
CO2: 23 mmol/L (ref 20–29)
Calcium: 9.5 mg/dL (ref 8.6–10.2)
Chloride: 103 mmol/L (ref 96–106)
Creatinine, Ser: 1.04 mg/dL (ref 0.76–1.27)
Globulin, Total: 2.3 g/dL (ref 1.5–4.5)
Glucose: 111 mg/dL — ABNORMAL HIGH (ref 70–99)
Potassium: 5.4 mmol/L — ABNORMAL HIGH (ref 3.5–5.2)
Sodium: 144 mmol/L (ref 134–144)
Total Protein: 6.9 g/dL (ref 6.0–8.5)
eGFR: 73 mL/min/{1.73_m2} (ref >59)

## 2022-12-23 LAB — HEMOGLOBIN A1C
Est. average glucose Bld gHb Est-mCnc: 160 mg/dL
Hgb A1c MFr Bld: 7.2 % — ABNORMAL HIGH (ref 4.8–5.6)

## 2022-12-23 LAB — LIPID PANEL
Chol/HDL Ratio: 2.6 ratio (ref 0.0–5.0)
Cholesterol, Total: 110 mg/dL (ref 100–199)
HDL: 43 mg/dL (ref >39)
LDL Chol Calc (NIH): 56 mg/dL (ref 0–99)
Triglycerides: 45 mg/dL (ref 0–149)
VLDL Cholesterol Cal: 11 mg/dL (ref 5–40)

## 2022-12-23 LAB — TSH: TSH: 4.06 u[IU]/mL (ref 0.450–4.500)

## 2022-12-23 NOTE — Progress Notes (Signed)
Patient notified

## 2022-12-30 ENCOUNTER — Telehealth: Payer: Self-pay | Admitting: Internal Medicine

## 2022-12-30 NOTE — Telephone Encounter (Signed)
Left VM that I need patient to come by and sign his patient assistance forms.

## 2023-01-02 ENCOUNTER — Other Ambulatory Visit: Payer: Self-pay | Admitting: Cardiovascular Disease

## 2023-01-02 DIAGNOSIS — I25119 Atherosclerotic heart disease of native coronary artery with unspecified angina pectoris: Secondary | ICD-10-CM

## 2023-01-02 DIAGNOSIS — I6523 Occlusion and stenosis of bilateral carotid arteries: Secondary | ICD-10-CM

## 2023-01-19 ENCOUNTER — Other Ambulatory Visit: Payer: Self-pay | Admitting: Internal Medicine

## 2023-01-19 ENCOUNTER — Other Ambulatory Visit: Payer: Medicare Other

## 2023-01-19 DIAGNOSIS — E875 Hyperkalemia: Secondary | ICD-10-CM | POA: Diagnosis not present

## 2023-01-20 LAB — POTASSIUM: Potassium: 5 mmol/L (ref 3.5–5.2)

## 2023-01-21 DIAGNOSIS — M1711 Unilateral primary osteoarthritis, right knee: Secondary | ICD-10-CM | POA: Diagnosis not present

## 2023-01-21 DIAGNOSIS — M25561 Pain in right knee: Secondary | ICD-10-CM | POA: Diagnosis not present

## 2023-01-21 DIAGNOSIS — M25661 Stiffness of right knee, not elsewhere classified: Secondary | ICD-10-CM | POA: Diagnosis not present

## 2023-01-21 DIAGNOSIS — R262 Difficulty in walking, not elsewhere classified: Secondary | ICD-10-CM | POA: Diagnosis not present

## 2023-01-26 ENCOUNTER — Other Ambulatory Visit: Payer: Self-pay | Admitting: Internal Medicine

## 2023-01-28 DIAGNOSIS — M1711 Unilateral primary osteoarthritis, right knee: Secondary | ICD-10-CM | POA: Diagnosis not present

## 2023-01-28 DIAGNOSIS — M25661 Stiffness of right knee, not elsewhere classified: Secondary | ICD-10-CM | POA: Diagnosis not present

## 2023-01-28 DIAGNOSIS — R262 Difficulty in walking, not elsewhere classified: Secondary | ICD-10-CM | POA: Diagnosis not present

## 2023-01-28 DIAGNOSIS — M25561 Pain in right knee: Secondary | ICD-10-CM | POA: Diagnosis not present

## 2023-01-29 ENCOUNTER — Ambulatory Visit: Payer: Medicare Other | Admitting: Dermatology

## 2023-01-29 DIAGNOSIS — L578 Other skin changes due to chronic exposure to nonionizing radiation: Secondary | ICD-10-CM

## 2023-01-29 DIAGNOSIS — Z85828 Personal history of other malignant neoplasm of skin: Secondary | ICD-10-CM

## 2023-01-29 DIAGNOSIS — W908XXA Exposure to other nonionizing radiation, initial encounter: Secondary | ICD-10-CM

## 2023-01-29 NOTE — Progress Notes (Signed)
   Follow-Up Visit   Subjective  Erik Collins is a 77 y.o. male who presents for the following: Recheck BCC site at the R nose lat supratip, tx with radiation. Pt states that inside of nose near site has been sore and tender to the touch, but no bleeding.   The following portions of the chart were reviewed this encounter and updated as appropriate: medications, allergies, medical history  Review of Systems:  No other skin or systemic complaints except as noted in HPI or Assessment and Plan.  Objective  Well appearing patient in no apparent distress; mood and affect are within normal limits.   A focused examination was performed of the following areas: the face   Relevant exam findings are noted in the Assessment and Plan.   Assessment & Plan   ACTINIC DAMAGE - chronic, secondary to cumulative UV radiation exposure/sun exposure over time - diffuse scaly erythematous macules with underlying dyspigmentation - Recommend daily broad spectrum sunscreen SPF 30+ to sun-exposed areas, reapply every 2 hours as needed.  - Recommend staying in the shade or wearing long sleeves, sun glasses (UVA+UVB protection) and wide brim hats (4-inch brim around the entire circumference of the hat). - Call for new or changing lesions.  HISTORY OF BASAL CELL CARCINOMA OF THE SKIN - R nose lat supratip,  S/P Radioation tx.  Clear today. nasal mucosa clear and normal appearing. - No evidence of recurrence today, nasal mucosa clear and normal appearing. - Recommend regular full body skin exams - Recommend daily broad spectrum sunscreen SPF 30+ to sun-exposed areas, reapply every 2 hours as needed.  - Call if any new or changing lesions are noted between office visits   Return for TBSE in 6-8 mths.  Maylene Roes, CMA, am acting as scribe for Armida Sans, MD .   Documentation: I have reviewed the above documentation for accuracy and completeness, and I agree with the above.  Armida Sans,  MD

## 2023-01-29 NOTE — Patient Instructions (Signed)

## 2023-02-03 ENCOUNTER — Encounter: Payer: Self-pay | Admitting: Dermatology

## 2023-02-04 DIAGNOSIS — M25561 Pain in right knee: Secondary | ICD-10-CM | POA: Diagnosis not present

## 2023-02-04 DIAGNOSIS — R262 Difficulty in walking, not elsewhere classified: Secondary | ICD-10-CM | POA: Diagnosis not present

## 2023-02-04 DIAGNOSIS — M1711 Unilateral primary osteoarthritis, right knee: Secondary | ICD-10-CM | POA: Diagnosis not present

## 2023-02-04 DIAGNOSIS — M25661 Stiffness of right knee, not elsewhere classified: Secondary | ICD-10-CM | POA: Diagnosis not present

## 2023-02-19 DIAGNOSIS — M1711 Unilateral primary osteoarthritis, right knee: Secondary | ICD-10-CM | POA: Diagnosis not present

## 2023-02-19 DIAGNOSIS — M25561 Pain in right knee: Secondary | ICD-10-CM | POA: Diagnosis not present

## 2023-02-19 DIAGNOSIS — M25661 Stiffness of right knee, not elsewhere classified: Secondary | ICD-10-CM | POA: Diagnosis not present

## 2023-02-19 DIAGNOSIS — R262 Difficulty in walking, not elsewhere classified: Secondary | ICD-10-CM | POA: Diagnosis not present

## 2023-02-25 DIAGNOSIS — M1711 Unilateral primary osteoarthritis, right knee: Secondary | ICD-10-CM | POA: Diagnosis not present

## 2023-02-25 DIAGNOSIS — R262 Difficulty in walking, not elsewhere classified: Secondary | ICD-10-CM | POA: Diagnosis not present

## 2023-02-25 DIAGNOSIS — M25661 Stiffness of right knee, not elsewhere classified: Secondary | ICD-10-CM | POA: Diagnosis not present

## 2023-02-25 DIAGNOSIS — M25561 Pain in right knee: Secondary | ICD-10-CM | POA: Diagnosis not present

## 2023-03-04 DIAGNOSIS — R262 Difficulty in walking, not elsewhere classified: Secondary | ICD-10-CM | POA: Diagnosis not present

## 2023-03-04 DIAGNOSIS — M1711 Unilateral primary osteoarthritis, right knee: Secondary | ICD-10-CM | POA: Diagnosis not present

## 2023-03-04 DIAGNOSIS — M25561 Pain in right knee: Secondary | ICD-10-CM | POA: Diagnosis not present

## 2023-03-04 DIAGNOSIS — M25661 Stiffness of right knee, not elsewhere classified: Secondary | ICD-10-CM | POA: Diagnosis not present

## 2023-03-09 ENCOUNTER — Encounter: Payer: Self-pay | Admitting: Cardiovascular Disease

## 2023-03-09 ENCOUNTER — Ambulatory Visit (INDEPENDENT_AMBULATORY_CARE_PROVIDER_SITE_OTHER): Payer: Medicare Other | Admitting: Cardiovascular Disease

## 2023-03-09 VITALS — BP 121/72 | HR 69 | Ht 71.0 in | Wt 182.9 lb

## 2023-03-09 DIAGNOSIS — I10 Essential (primary) hypertension: Secondary | ICD-10-CM

## 2023-03-09 DIAGNOSIS — I25119 Atherosclerotic heart disease of native coronary artery with unspecified angina pectoris: Secondary | ICD-10-CM | POA: Diagnosis not present

## 2023-03-09 DIAGNOSIS — E782 Mixed hyperlipidemia: Secondary | ICD-10-CM | POA: Diagnosis not present

## 2023-03-09 DIAGNOSIS — I6523 Occlusion and stenosis of bilateral carotid arteries: Secondary | ICD-10-CM | POA: Diagnosis not present

## 2023-03-09 DIAGNOSIS — I50812 Chronic right heart failure: Secondary | ICD-10-CM | POA: Diagnosis not present

## 2023-03-09 NOTE — Progress Notes (Signed)
Cardiology Office Note   Date:  03/09/2023   ID:  Erik Collins, Erik Collins 10-14-1945, MRN [id number]  PCP:  Margaretann Loveless, MD  Cardiologist:  Adrian Blackwater, MD      History of Present Illness: Erik Collins is a 78 y.o. male who presents for  Chief Complaint  Patient presents with   Follow-up    3 month follow up     Doing well, denies any chest pain or shortness of breath.  Patient states he has been taking his medication regularly.      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   R nose lateral supratip - referred for radiation oncology   Cancer [facility])    hx lung cancer   CHF (congestive heart failure) (HCC)    Complication of anesthesia    knee surgery caused arrythmia requiring a temporary pacemaker   COPD (chronic obstructive pulmonary disease) (HCC)    Coronary artery disease    s/p CABG   Diabetes mellitus without complication (HCC)    Diverticulosis    Duodenitis    Dysrhythmia    during total knee replacement patient required a temporary pacemaker   GERD (gastroesophageal reflux disease)    Hypercholesterolemia    Hyperplastic colon polyp    Hypertension    Internal hemorrhoids    Lung cancer (HCC)    Lung cancer (HCC)    Myocardial infarction (HCC)    09/2009 f/up with Dr. Adrian Blackwater   Sleep apnea      Past Surgical History:  Procedure Laterality Date   BACK SURGERY     CATARACT EXTRACTION W/PHACO Left 11/09/2019   Procedure: CATARACT EXTRACTION PHACO AND INTRAOCULAR LENS PLACEMENT (IOC) LEFT DIABETIC MALYUGIN 5.91 01:01.0 9.6%;  Surgeon: Lockie Mola, MD;  Location: [facility] SURGERY CNTR;  Service: Ophthalmology;  Laterality: Left;   CATARACT EXTRACTION W/PHACO Right 12/07/2019   Procedure: CATARACT EXTRACTION PHACO AND INTRAOCULAR LENS PLACEMENT (IOC) RIGHT DIABETIC MALYUGIN 6.38 01:05.1 9.8%;  Surgeon: Lockie Mola, MD;  Location: [facility] SURGERY CNTR;  Service:  Ophthalmology;  Laterality: Right;   COLONOSCOPY N/A 11/23/2019   Procedure: COLONOSCOPY;  Surgeon: Toledo, Boykin Nearing, MD;  Location: [facility] ENDOSCOPY;  Service: Gastroenterology;  Laterality: N/A;   COLONOSCOPY WITH PROPOFOL N/A 11/09/2014   Procedure: COLONOSCOPY WITH PROPOFOL;  Surgeon: Elnita Maxwell, MD;  Location: [facility] ENDOSCOPY;  Service: Endoscopy;  Laterality: N/A;   CORONARY ANGIOPLASTY     CORONARY ARTERY BYPASS GRAFT  09/2009   quadruple, done at [facility]   CORONARY ARTERY BYPASS GRAFT     ESOPHAGOGASTRODUODENOSCOPY (EGD) WITH PROPOFOL N/A 11/09/2014   Procedure: ESOPHAGOGASTRODUODENOSCOPY (EGD) WITH PROPOFOL;  Surgeon: Elnita Maxwell, MD;  Location: [facility] ENDOSCOPY;  Service: Endoscopy;  Laterality: N/A;   left lower lobectomy  09/2009   at [facility] LAMINECTOMY/DECOMPRESSION MICRODISCECTOMY  12/17/2011   Procedure: LUMBAR LAMINECTOMY/DECOMPRESSION MICRODISCECTOMY 1 LEVEL;  Surgeon: Tia Alert, MD;  Location: [facility] NEURO ORS;  Service: Neurosurgery;  Laterality: Right;  Right Lumbar five-sacral one microdiscectomy   TEMPORARY PACEMAKER Right 07/10/2017   Procedure: TEMPORARY PACEMAKER;  Surgeon: Marcina Millard, MD;  Location: [facility] INVASIVE CV LAB;  Service: Cardiovascular;  Laterality: Right;   TOTAL KNEE ARTHROPLASTY Left 07/08/2017   Procedure: TOTAL KNEE ARTHROPLASTY;  Surgeon: Deeann Saint, MD;  Location: [facility] ORS;  Service: Orthopedics;  Laterality: Left;     Current Outpatient Medications  Medication Sig Dispense Refill  acetaminophen (TYLENOL) 650 MG CR tablet Take 1,300 mg by mouth 2 (two) times daily.     aspirin EC 81 MG tablet Take 81 mg by mouth daily. (Patient not taking: Reported on 01/29/2023)     carvedilol (COREG) 12.5 MG tablet Take 1 tablet (12.5 mg total) by mouth 2 (two) times daily with a meal. 180 tablet 3   empagliflozin (JARDIANCE) 10 MG TABS tablet Take 1 tablet (10 mg total) by mouth daily. 90 tablet 3   ezetimibe (ZETIA) 10 MG tablet TAKE  1 TABLET BY MOUTH DAILY 90 tablet 1   gabapentin (NEURONTIN) 600 MG tablet TAKE 1 TABLET BY MOUTH TWICE A DAY 180 tablet 3   Insulin Glargine 300 UNIT/ML SOPN Inject 0-40 Units into the skin See admin instructions. Sliding scale insulin per patient  3 units if blood sugar is greater than 150 during the day, if blood sugars are less= no insulin 40 units if blood sugar is greater than 150 at night, if blood sugars are less=no insulin     insulin glargine, 2 Unit Dial, (TOUJEO MAX SOLOSTAR) 300 UNIT/ML Solostar Pen Inject 40 Units into the skin at bedtime. 90 mL 1   losartan-hydrochlorothiazide (HYZAAR) 50-12.5 MG tablet Take 1 tablet by mouth daily. 30 tablet 11   meclizine (ANTIVERT) 25 MG tablet Take 1 tablet (25 mg total) by mouth 3 (three) times daily as needed for dizziness. 15 tablet 0   metFORMIN (GLUCOPHAGE) 1000 MG tablet TAKE 1 TABLET TWICE DAILY 180 tablet 3   NEXLETOL 180 MG TABS TAKE 1 TABLET BY MOUTH DAILY 90 tablet 3   Omega-3 Fatty Acids (FISH OIL) 1200 MG CAPS Take 1,200 mg by mouth 2 (two) times daily.     omeprazole (PRILOSEC) 20 MG capsule TAKE 1 CAPSULE EVERY DAY 90 capsule 3   vitamin B-12 (CYANOCOBALAMIN) 500 MCG tablet Take 500 mcg by mouth daily.     No current facility-administered medications for this visit.    Allergies:   Spironolactone and Statins    Social History:   reports that he quit smoking about 13 years ago. His smoking use included cigarettes. He has never used smokeless tobacco. He reports that he does not drink alcohol and does not use drugs.   Family History:  family history includes Diabetes in his mother; Heart attack in his father; Heart disease in his father and mother; Stroke in his father.    ROS:     Review of Systems  Constitutional: Negative.   HENT: Negative.    Eyes: Negative.   Respiratory: Negative.    Gastrointestinal: Negative.   Genitourinary: Negative.   Musculoskeletal: Negative.   Skin: Negative.   Neurological: Negative.    Endo/Heme/Allergies: Negative.   Psychiatric/Behavioral: Negative.    All other systems reviewed and are negative.     All other systems are reviewed and negative.    PHYSICAL EXAM: VS:  BP 121/72   Pulse 69   Ht 5\' 11"  (1.803 m)   Wt 182 lb 14.4 oz (83 kg)   SpO2 97%   BMI 25.51 kg/m  , BMI Body mass index is 25.51 kg/m. Last weight:  Wt Readings from Last 3 Encounters:  03/09/23 182 lb 14.4 oz (83 kg)  12/22/22 183 lb 12.8 oz (83.4 kg)  12/11/22 185 lb 8 oz (84.1 kg)     Physical Exam Vitals reviewed.  Constitutional:      Appearance: Normal appearance. He is normal weight.  HENT:     Head:  Normocephalic.     Nose: Nose normal.     Mouth/Throat:     Mouth: Mucous membranes are moist.  Eyes:     Pupils: Pupils are equal, round, and reactive to light.  Cardiovascular:     Rate and Rhythm: Normal rate and regular rhythm.     Pulses: Normal pulses.     Heart sounds: Normal heart sounds.  Pulmonary:     Effort: Pulmonary effort is normal.  Abdominal:     General: Abdomen is flat. Bowel sounds are normal.  Musculoskeletal:        General: Normal range of motion.     Cervical back: Normal range of motion.  Skin:    General: Skin is warm.  Neurological:     General: No focal deficit present.     Mental Status: He is alert.  Psychiatric:        Mood and Affect: Mood normal.       EKG:   Recent Labs: 09/15/2022: Hemoglobin 12.2; Platelets 244 12/22/2022: ALT 12; BUN 29; Creatinine, Ser 1.04; Sodium 144; TSH 4.060 01/19/2023: Potassium 5.0    Lipid Panel    Component Value Date/Time   CHOL 110 12/22/2022 0833   TRIG 45 12/22/2022 0833   HDL 43 12/22/2022 0833   CHOLHDL 2.6 12/22/2022 0833   LDLCALC 56 12/22/2022 0833      Other studies Reviewed: Additional studies/ records that were reviewed today include:  Review of the above records demonstrates:       No data to display            ASSESSMENT AND PLAN:    ICD-10-CM   1. Mixed  hyperlipidemia  E78.2     2. Bilateral carotid artery stenosis  I65.23     3. Essential hypertension  I10    Blood pressure appears to be controlled    4. Chronic right-sided congestive heart failure (HCC)  I50.812     5. Coronary artery disease involving native coronary artery of native heart with angina pectoris (HCC)  I25.119        Problem List Items Addressed This Visit       Cardiovascular and Mediastinum   Carotid stenosis   Essential hypertension   Congestive heart failure (HCC)   Coronary disease     Other   Hyperlipidemia - Primary       Disposition:   Return in about 3 months (around 06/07/2023).    Total time spent: 30 minutes  Signed,  Adrian Blackwater, MD  03/09/2023 9:44 AM    [facility]

## 2023-03-11 DIAGNOSIS — M25561 Pain in right knee: Secondary | ICD-10-CM | POA: Diagnosis not present

## 2023-03-11 DIAGNOSIS — R262 Difficulty in walking, not elsewhere classified: Secondary | ICD-10-CM | POA: Diagnosis not present

## 2023-03-11 DIAGNOSIS — M1711 Unilateral primary osteoarthritis, right knee: Secondary | ICD-10-CM | POA: Diagnosis not present

## 2023-03-11 DIAGNOSIS — M25661 Stiffness of right knee, not elsewhere classified: Secondary | ICD-10-CM | POA: Diagnosis not present

## 2023-04-03 ENCOUNTER — Telehealth: Payer: Self-pay | Admitting: Internal Medicine

## 2023-04-03 NOTE — Telephone Encounter (Signed)
Patient stopped by and had concerns about his feet staying cold and has concerns about a callus on each foot. Dr. Welton Flakes advised for him to see Joice Lofts and her place a podiatry referral.

## 2023-04-06 ENCOUNTER — Encounter: Payer: Self-pay | Admitting: Cardiology

## 2023-04-06 ENCOUNTER — Ambulatory Visit (INDEPENDENT_AMBULATORY_CARE_PROVIDER_SITE_OTHER): Payer: Medicare Other | Admitting: Cardiology

## 2023-04-06 VITALS — BP 100/54 | HR 66 | Ht 71.0 in | Wt 180.0 lb

## 2023-04-06 DIAGNOSIS — Z0130 Encounter for examination of blood pressure without abnormal findings: Secondary | ICD-10-CM

## 2023-04-06 DIAGNOSIS — I25119 Atherosclerotic heart disease of native coronary artery with unspecified angina pectoris: Secondary | ICD-10-CM | POA: Diagnosis not present

## 2023-04-06 DIAGNOSIS — E119 Type 2 diabetes mellitus without complications: Secondary | ICD-10-CM | POA: Diagnosis not present

## 2023-04-06 DIAGNOSIS — R209 Unspecified disturbances of skin sensation: Secondary | ICD-10-CM

## 2023-04-06 NOTE — Progress Notes (Signed)
Established Patient Office Visit  Subjective:  Patient ID: Erik Collins, male    DOB: 1945-07-23  Age: 78 y.o. MRN: [id number]  Chief Complaint  Patient presents with   Acute Visit    Foot Problem, Burning on Both Feet    Patient in office with complaints of feet burning, sometimes they are cold per patient. Denies pain in legs when walking. Patient has a history of CAD and carotid stenosis. Reports not having legs checked for PVD. Will order ABIs.  Also has callouses on his feet, requesting a referral to dermatology. Referral sent.    No other concerns at this time.   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   R nose lateral supratip - referred for radiation oncology   Cancer [facility])    hx lung cancer   CHF (congestive heart failure) (HCC)    Complication of anesthesia    knee surgery caused arrythmia requiring a temporary pacemaker   COPD (chronic obstructive pulmonary disease) (HCC)    Coronary artery disease    s/p CABG   Diabetes mellitus without complication (HCC)    Diverticulosis    Duodenitis    Dysrhythmia    during total knee replacement patient required a temporary pacemaker   GERD (gastroesophageal reflux disease)    Hypercholesterolemia    Hyperplastic colon polyp    Hypertension    Internal hemorrhoids    Lung cancer (HCC)    Lung cancer (HCC)    Myocardial infarction (HCC)    09/2009 f/up with Dr. Adrian Blackwater   Sleep apnea     Past Surgical History:  Procedure Laterality Date   BACK SURGERY     CATARACT EXTRACTION W/PHACO Left 11/09/2019   Procedure: CATARACT EXTRACTION PHACO AND INTRAOCULAR LENS PLACEMENT (IOC) LEFT DIABETIC MALYUGIN 5.91 01:01.0 9.6%;  Surgeon: Lockie Mola, MD;  Location: [facility] SURGERY CNTR;  Service: Ophthalmology;  Laterality: Left;   CATARACT EXTRACTION W/PHACO Right 12/07/2019   Procedure: CATARACT EXTRACTION PHACO AND INTRAOCULAR LENS PLACEMENT (IOC)  RIGHT DIABETIC MALYUGIN 6.38 01:05.1 9.8%;  Surgeon: Lockie Mola, MD;  Location: [facility] SURGERY CNTR;  Service: Ophthalmology;  Laterality: Right;   COLONOSCOPY N/A 11/23/2019   Procedure: COLONOSCOPY;  Surgeon: Toledo, Boykin Nearing, MD;  Location: [facility] ENDOSCOPY;  Service: Gastroenterology;  Laterality: N/A;   COLONOSCOPY WITH PROPOFOL N/A 11/09/2014   Procedure: COLONOSCOPY WITH PROPOFOL;  Surgeon: Elnita Maxwell, MD;  Location: [facility] ENDOSCOPY;  Service: Endoscopy;  Laterality: N/A;   CORONARY ANGIOPLASTY     CORONARY ARTERY BYPASS GRAFT  09/2009   quadruple, done at [facility]   CORONARY ARTERY BYPASS GRAFT     ESOPHAGOGASTRODUODENOSCOPY (EGD) WITH PROPOFOL N/A 11/09/2014   Procedure: ESOPHAGOGASTRODUODENOSCOPY (EGD) WITH PROPOFOL;  Surgeon: Elnita Maxwell, MD;  Location: [facility] ENDOSCOPY;  Service: Endoscopy;  Laterality: N/A;   left lower lobectomy  09/2009   at [facility] LAMINECTOMY/DECOMPRESSION MICRODISCECTOMY  12/17/2011   Procedure: LUMBAR LAMINECTOMY/DECOMPRESSION MICRODISCECTOMY 1 LEVEL;  Surgeon: Tia Alert, MD;  Location: [facility] NEURO ORS;  Service: Neurosurgery;  Laterality: Right;  Right Lumbar five-sacral one microdiscectomy   TEMPORARY PACEMAKER Right 07/10/2017   Procedure: TEMPORARY PACEMAKER;  Surgeon: Marcina Millard, MD;  Location: [facility] INVASIVE CV LAB;  Service: Cardiovascular;  Laterality: Right;   TOTAL KNEE ARTHROPLASTY Left 07/08/2017   Procedure: TOTAL KNEE ARTHROPLASTY;  Surgeon: Deeann Saint, MD;  Location: [facility] ORS;  Service: Orthopedics;  Laterality: Left;  Social History   Socioeconomic History   Marital status: Married    Spouse name: Not on file   Number of children: Not on file   Years of education: Not on file   Highest education level: Not on file  Occupational History   Not on file  Tobacco Use   Smoking status: Former    Current packs/day: 0.00    Types: Cigarettes    Quit date: 09/19/2009    Years since quitting: 13.5    Smokeless tobacco: Never  Vaping Use   Vaping status: Never Used  Substance and Sexual Activity   Alcohol use: No   Drug use: No   Sexual activity: Not on file  Other Topics Concern   Not on file  Social History Narrative   Not on file   Social Drivers of Health   Financial Resource Strain: Not on file  Food Insecurity: Not on file  Transportation Needs: Not on file  Physical Activity: Not on file  Stress: Not on file  Social Connections: Not on file  Intimate Partner Violence: Not on file    Family History  Problem Relation Age of Onset   Heart disease Mother    Diabetes Mother    Heart disease Father    Stroke Father    Heart attack Father     Allergies  Allergen Reactions   Spironolactone    Statins     Outpatient Medications Prior to Visit  Medication Sig   acetaminophen (TYLENOL) 650 MG CR tablet Take 1,300 mg by mouth 2 (two) times daily.   aspirin EC 81 MG tablet Take 81 mg by mouth daily. (Patient not taking: Reported on 01/29/2023)   carvedilol (COREG) 12.5 MG tablet Take 1 tablet (12.5 mg total) by mouth 2 (two) times daily with a meal.   empagliflozin (JARDIANCE) 10 MG TABS tablet Take 1 tablet (10 mg total) by mouth daily.   ezetimibe (ZETIA) 10 MG tablet TAKE 1 TABLET BY MOUTH DAILY   gabapentin (NEURONTIN) 600 MG tablet TAKE 1 TABLET BY MOUTH TWICE A DAY   Insulin Glargine 300 UNIT/ML SOPN Inject 0-40 Units into the skin See admin instructions. Sliding scale insulin per patient  3 units if blood sugar is greater than 150 during the day, if blood sugars are less= no insulin 40 units if blood sugar is greater than 150 at night, if blood sugars are less=no insulin   insulin glargine, 2 Unit Dial, (TOUJEO MAX SOLOSTAR) 300 UNIT/ML Solostar Pen Inject 40 Units into the skin at bedtime.   losartan-hydrochlorothiazide (HYZAAR) 50-12.5 MG tablet Take 1 tablet by mouth daily.   meclizine (ANTIVERT) 25 MG tablet Take 1 tablet (25 mg total) by mouth 3 (three)  times daily as needed for dizziness.   metFORMIN (GLUCOPHAGE) 1000 MG tablet TAKE 1 TABLET TWICE DAILY   NEXLETOL 180 MG TABS TAKE 1 TABLET BY MOUTH DAILY   Omega-3 Fatty Acids (FISH OIL) 1200 MG CAPS Take 1,200 mg by mouth 2 (two) times daily.   omeprazole (PRILOSEC) 20 MG capsule TAKE 1 CAPSULE EVERY DAY   vitamin B-12 (CYANOCOBALAMIN) 500 MCG tablet Take 500 mcg by mouth daily.   No facility-administered medications prior to visit.    Review of Systems  Constitutional: Negative.   HENT: Negative.    Eyes: Negative.   Respiratory: Negative.  Negative for shortness of breath.   Cardiovascular: Negative.  Negative for chest pain.  Gastrointestinal: Negative.  Negative for abdominal pain, constipation and diarrhea.  Genitourinary:  Negative.   Musculoskeletal:  Negative for joint pain and myalgias.  Skin: Negative.   Neurological: Negative.  Negative for dizziness and headaches.  Endo/Heme/Allergies: Negative.   All other systems reviewed and are negative.      Objective:   BP (!) 100/54   Pulse 66   Ht 5\' 11"  (1.803 m)   Wt 180 lb (81.6 kg)   SpO2 99%   BMI 25.10 kg/m   Vitals:   04/06/23 0901  BP: (!) 100/54  Pulse: 66  Height: 5\' 11"  (1.803 m)  Weight: 180 lb (81.6 kg)  SpO2: 99%  BMI (Calculated): 25.12    Physical Exam Nursing note reviewed.  Constitutional:      Appearance: Normal appearance. He is normal weight.  HENT:     Head: Normocephalic and atraumatic.     Nose: Nose normal.     Mouth/Throat:     Mouth: Mucous membranes are moist.     Pharynx: Oropharynx is clear.  Eyes:     Extraocular Movements: Extraocular movements intact.     Conjunctiva/sclera: Conjunctivae normal.     Pupils: Pupils are equal, round, and reactive to light.  Cardiovascular:     Rate and Rhythm: Normal rate and regular rhythm.     Pulses: Normal pulses.     Heart sounds: Normal heart sounds.  Pulmonary:     Effort: Pulmonary effort is normal.     Breath sounds: Normal  breath sounds.  Abdominal:     General: Abdomen is flat. Bowel sounds are normal.     Palpations: Abdomen is soft.  Musculoskeletal:        General: Normal range of motion.     Cervical back: Normal range of motion.  Skin:    General: Skin is warm and dry.  Neurological:     General: No focal deficit present.     Mental Status: He is alert and oriented to person, place, and time.  Psychiatric:        Mood and Affect: Mood normal.        Behavior: Behavior normal.        Thought Content: Thought content normal.        Judgment: Judgment normal.      No results found for any visits on 04/06/23.  Recent Results (from the past 2160 hours)  Potassium     Status: None   Collection Time: 01/19/23 10:11 AM  Result Value Ref Range   Potassium 5.0 3.5 - 5.2 mmol/L      Assessment & Plan:  Referral to dermatology sent.  ABIs ordered.   Problem List Items Addressed This Visit       Cardiovascular and Mediastinum   Coronary disease   Relevant Orders   US ARTERIAL ABI (SCREENING LOWER EXTREMITY)     Endocrine   Diabetes mellitus type 2, uncomplicated (HCC) - Primary   Relevant Orders   Ambulatory referral to Podiatry   US ARTERIAL ABI (SCREENING LOWER EXTREMITY)   Other Visit Diagnoses       Cold feet       Relevant Orders   US ARTERIAL ABI (SCREENING LOWER EXTREMITY)       Return if symptoms worsen or fail to improve, for keep March appt with NK.   Total time spent: 25 minutes  [employer], NP  04/06/2023   This document may have been prepared by Dragon Voice Recognition software and as such may include unintentional dictation errors.

## 2023-04-13 DIAGNOSIS — E1140 Type 2 diabetes mellitus with diabetic neuropathy, unspecified: Secondary | ICD-10-CM | POA: Diagnosis not present

## 2023-04-13 DIAGNOSIS — Z794 Long term (current) use of insulin: Secondary | ICD-10-CM | POA: Diagnosis not present

## 2023-04-13 DIAGNOSIS — L600 Ingrowing nail: Secondary | ICD-10-CM | POA: Diagnosis not present

## 2023-04-13 DIAGNOSIS — L851 Acquired keratosis [keratoderma] palmaris et plantaris: Secondary | ICD-10-CM | POA: Diagnosis not present

## 2023-04-13 DIAGNOSIS — B351 Tinea unguium: Secondary | ICD-10-CM | POA: Diagnosis not present

## 2023-04-15 ENCOUNTER — Ambulatory Visit
Admission: RE | Admit: 2023-04-15 | Discharge: 2023-04-15 | Disposition: A | Discharge status: Home / Self Care | Payer: Medicare Other | Source: Ambulatory Visit | Attending: Cardiology | Admitting: Cardiology

## 2023-04-15 DIAGNOSIS — E1151 Type 2 diabetes mellitus with diabetic peripheral angiopathy without gangrene: Secondary | ICD-10-CM | POA: Diagnosis not present

## 2023-04-15 DIAGNOSIS — E785 Hyperlipidemia, unspecified: Secondary | ICD-10-CM | POA: Diagnosis not present

## 2023-04-15 DIAGNOSIS — R209 Unspecified disturbances of skin sensation: Secondary | ICD-10-CM | POA: Diagnosis not present

## 2023-04-15 DIAGNOSIS — I25119 Atherosclerotic heart disease of native coronary artery with unspecified angina pectoris: Secondary | ICD-10-CM | POA: Insufficient documentation

## 2023-04-15 DIAGNOSIS — I10 Essential (primary) hypertension: Secondary | ICD-10-CM | POA: Diagnosis not present

## 2023-04-15 DIAGNOSIS — E119 Type 2 diabetes mellitus without complications: Secondary | ICD-10-CM | POA: Insufficient documentation

## 2023-04-20 ENCOUNTER — Ambulatory Visit: Payer: Medicare Other | Admitting: Internal Medicine

## 2023-04-20 ENCOUNTER — Other Ambulatory Visit

## 2023-04-20 DIAGNOSIS — E119 Type 2 diabetes mellitus without complications: Secondary | ICD-10-CM | POA: Diagnosis not present

## 2023-04-20 DIAGNOSIS — I10 Essential (primary) hypertension: Secondary | ICD-10-CM | POA: Diagnosis not present

## 2023-04-20 DIAGNOSIS — E782 Mixed hyperlipidemia: Secondary | ICD-10-CM

## 2023-04-21 ENCOUNTER — Other Ambulatory Visit: Payer: Self-pay | Admitting: Internal Medicine

## 2023-04-21 LAB — CMP14+EGFR
ALT: 8 IU/L (ref 0–44)
AST: 20 IU/L (ref 0–40)
Albumin: 4.3 g/dL (ref 3.8–4.8)
Alkaline Phosphatase: 43 IU/L — ABNORMAL LOW (ref 44–121)
BUN/Creatinine Ratio: 22 (ref 10–24)
BUN: 30 mg/dL — ABNORMAL HIGH (ref 8–27)
Bilirubin Total: 0.2 mg/dL (ref 0.0–1.2)
CO2: 21 mmol/L (ref 20–29)
Calcium: 9.1 mg/dL (ref 8.6–10.2)
Chloride: 106 mmol/L (ref 96–106)
Creatinine, Ser: 1.36 mg/dL — ABNORMAL HIGH (ref 0.76–1.27)
Globulin, Total: 2.3 g/dL (ref 1.5–4.5)
Glucose: 83 mg/dL (ref 70–99)
Potassium: 5.3 mmol/L — ABNORMAL HIGH (ref 3.5–5.2)
Sodium: 144 mmol/L (ref 134–144)
Total Protein: 6.6 g/dL (ref 6.0–8.5)
eGFR: 53 mL/min/{1.73_m2} — ABNORMAL LOW (ref >59)

## 2023-04-21 LAB — LIPID PANEL
Chol/HDL Ratio: 3.1 ratio (ref 0.0–5.0)
Cholesterol, Total: 116 mg/dL (ref 100–199)
HDL: 38 mg/dL — ABNORMAL LOW (ref >39)
LDL Chol Calc (NIH): 63 mg/dL (ref 0–99)
Triglycerides: 72 mg/dL (ref 0–149)
VLDL Cholesterol Cal: 15 mg/dL (ref 5–40)

## 2023-04-21 LAB — HEMOGLOBIN A1C
Est. average glucose Bld gHb Est-mCnc: 163 mg/dL
Hgb A1c MFr Bld: 7.3 % — ABNORMAL HIGH (ref 4.8–5.6)

## 2023-04-21 LAB — TSH: TSH: 4.28 u[IU]/mL (ref 0.450–4.500)

## 2023-04-21 NOTE — Progress Notes (Signed)
 Patient notified

## 2023-04-22 DIAGNOSIS — M25561 Pain in right knee: Secondary | ICD-10-CM | POA: Diagnosis not present

## 2023-04-22 DIAGNOSIS — R262 Difficulty in walking, not elsewhere classified: Secondary | ICD-10-CM | POA: Diagnosis not present

## 2023-04-22 DIAGNOSIS — M1711 Unilateral primary osteoarthritis, right knee: Secondary | ICD-10-CM | POA: Diagnosis not present

## 2023-04-22 DIAGNOSIS — M25661 Stiffness of right knee, not elsewhere classified: Secondary | ICD-10-CM | POA: Diagnosis not present

## 2023-04-27 ENCOUNTER — Encounter: Payer: Self-pay | Admitting: Internal Medicine

## 2023-04-27 ENCOUNTER — Ambulatory Visit (INDEPENDENT_AMBULATORY_CARE_PROVIDER_SITE_OTHER): Payer: Medicare Other | Admitting: Internal Medicine

## 2023-04-27 VITALS — BP 138/82 | HR 52 | Ht 71.0 in | Wt 183.4 lb

## 2023-04-27 DIAGNOSIS — I25119 Atherosclerotic heart disease of native coronary artery with unspecified angina pectoris: Secondary | ICD-10-CM

## 2023-04-27 DIAGNOSIS — E1159 Type 2 diabetes mellitus with other circulatory complications: Secondary | ICD-10-CM | POA: Diagnosis not present

## 2023-04-27 DIAGNOSIS — N189 Chronic kidney disease, unspecified: Secondary | ICD-10-CM | POA: Insufficient documentation

## 2023-04-27 DIAGNOSIS — E782 Mixed hyperlipidemia: Secondary | ICD-10-CM | POA: Diagnosis not present

## 2023-04-27 DIAGNOSIS — E1169 Type 2 diabetes mellitus with other specified complication: Secondary | ICD-10-CM | POA: Insufficient documentation

## 2023-04-27 DIAGNOSIS — I152 Hypertension secondary to endocrine disorders: Secondary | ICD-10-CM | POA: Diagnosis not present

## 2023-04-27 DIAGNOSIS — E119 Type 2 diabetes mellitus without complications: Secondary | ICD-10-CM

## 2023-04-27 LAB — POC CREATINE & ALBUMIN,URINE
Albumin/Creatinine Ratio, Urine, POC: >300
Creatinine, POC: 100 mg/dL
Microalbumin Ur, POC: 150 mg/L

## 2023-04-27 LAB — POCT CBG (FASTING - GLUCOSE)-MANUAL ENTRY: Glucose Fasting, POC: 80 mg/dL (ref 70–99)

## 2023-04-27 NOTE — Progress Notes (Signed)
 Established Patient Office Visit  Subjective:  Patient ID: Erik Collins, male    DOB: 11-Jun-1945  Age: 78 y.o. MRN: [id number]  Chief Complaint  Patient presents with   Follow-up    4 month follow up    Patient comes in for his follow-up accompanied by his wife.  He is generally feeling well except for right knee pain.  Patient mentions that her his left knee status post replacement is still not doing well.  He has been doing exercises and received a gel injection in his right knee.  Labs discussed today.  Needs to improve his diet control. Also needs urine microalbumin today.    No other concerns at this time.   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   R nose lateral supratip - referred for radiation oncology   Cancer [facility])    hx lung cancer   CHF (congestive heart failure) (HCC)    Complication of anesthesia    knee surgery caused arrythmia requiring a temporary pacemaker   COPD (chronic obstructive pulmonary disease) (HCC)    Coronary artery disease    s/p CABG   Diabetes mellitus without complication (HCC)    Diverticulosis    Duodenitis    Dysrhythmia    during total knee replacement patient required a temporary pacemaker   GERD (gastroesophageal reflux disease)    Hypercholesterolemia    Hyperplastic colon polyp    Hypertension    Internal hemorrhoids    Lung cancer (HCC)    Lung cancer (HCC)    Myocardial infarction (HCC)    09/2009 f/up with Dr. Adrian Blackwater   Sleep apnea     Past Surgical History:  Procedure Laterality Date   BACK SURGERY     CATARACT EXTRACTION W/PHACO Left 11/09/2019   Procedure: CATARACT EXTRACTION PHACO AND INTRAOCULAR LENS PLACEMENT (IOC) LEFT DIABETIC MALYUGIN 5.91 01:01.0 9.6%;  Surgeon: Lockie Mola, MD;  Location: [facility];  Service: Ophthalmology;  Laterality: Left;   CATARACT EXTRACTION W/PHACO Right 12/07/2019   Procedure: CATARACT EXTRACTION  PHACO AND INTRAOCULAR LENS PLACEMENT (IOC) RIGHT DIABETIC MALYUGIN 6.38 01:05.1 9.8%;  Surgeon: Lockie Mola, MD;  Location: [facility]ERY [facility];  Service: Ophthalmology;  Laterality: Right;   COLONOSCOPY N/A 11/23/2019   Procedure: COLONOSCOPY;  Surgeon: Toledo, Boykin Nearing, MD;  Location: [facility] ENDOSCOPY;  Service: Gastroenterology;  Laterality: N/A;   COLONOSCOPY WITH PROPOFOL N/A 11/09/2014   Procedure: COLONOSCOPY WITH PROPOFOL;  Surgeon: Elnita Maxwell, MD;  Location: [facility] ENDOSCOPY;  Service: Endoscopy;  Laterality: N/A;   CORONARY ANGIOPLASTY     CORONARY ARTERY BYPASS GRAFT  09/2009   quadruple, done at [facility]   CORONARY ARTERY BYPASS GRAFT     ESOPHAGOGASTRODUODENOSCOPY (EGD) WITH PROPOFOL N/A 11/09/2014   Procedure: ESOPHAGOGASTRODUODENOSCOPY (EGD) WITH PROPOFOL;  Surgeon: Elnita Maxwell, MD;  Location: [facility] ENDOSCOPY;  Service: Endoscopy;  Laterality: N/A;   left lower lobectomy  09/2009   at [facility] LAMINECTOMY/DECOMPRESSION MICRODISCECTOMY  12/17/2011   Procedure: LUMBAR LAMINECTOMY/DECOMPRESSION MICRODISCECTOMY 1 LEVEL;  Surgeon: Tia Alert, MD;  Location: [facility] NEURO ORS;  Service: Neurosurgery;  Laterality: Right;  Right Lumbar five-sacral one microdiscectomy   TEMPORARY PACEMAKER Right 07/10/2017   Procedure: TEMPORARY PACEMAKER;  Surgeon: Marcina Millard, MD;  Location: [facility] INVASIVE CV LAB;  Service: Cardiovascular;  Laterality: Right;   TOTAL KNEE ARTHROPLASTY Left 07/08/2017   Procedure: TOTAL KNEE ARTHROPLASTY;  Surgeon: Deeann Saint, MD;  Location: [facility] ORS;  Service: Orthopedics;  Laterality: Left;    Social History   Socioeconomic History   Marital status: Married    Spouse name: Not on file   Number of children: Not on file   Years of education: Not on file   Highest education level: Not on file  Occupational History   Not on file  Tobacco Use   Smoking status: Former    Current packs/day: 0.00    Types: Cigarettes    Quit date:  09/19/2009    Years since quitting: 13.6   Smokeless tobacco: Never  Vaping Use   Vaping status: Never Used  Substance and Sexual Activity   Alcohol use: No   Drug use: No   Sexual activity: Not on file  Other Topics Concern   Not on file  Social History Narrative   Not on file   Social Drivers of Health   Financial Resource Strain: Patient Declined (04/13/2023)   Received from [facility]   Overall Financial Resource Strain (CARDIA)    Difficulty of Paying Living Expenses: Patient declined  Food Insecurity: Patient Declined (04/13/2023)   Received from [facility]   Hunger Vital Sign    Worried About Running Out of Food in the Last Year: Patient declined    Ran Out of Food in the Last Year: Patient declined  Transportation Needs: Patient Declined (04/13/2023)   Received from [facility]   PRAPARE - Transportation    In the past 12 months, has lack of transportation kept you from medical appointments or from getting medications?: Patient declined    Lack of Transportation (Non-Medical): Patient declined  Physical Activity: Not on file  Stress: Not on file  Social Connections: Not on file  Intimate Partner Violence: Not on file    Family History  Problem Relation Age of Onset   Heart disease Mother    Diabetes Mother    Heart disease Father    Stroke Father    Heart attack Father     Allergies  Allergen Reactions   Spironolactone    Statins     Outpatient Medications Prior to Visit  Medication Sig   acetaminophen (TYLENOL) 650 MG CR tablet Take 1,300 mg by mouth 2 (two) times daily.   aspirin EC 81 MG tablet Take 81 mg by mouth daily.   carvedilol (COREG) 12.5 MG tablet TAKE 1 TABLET BY MOUTH 2 TIMES DAILY WITH A MEAL   empagliflozin (JARDIANCE) 25 MG TABS tablet Take 25 mg by mouth daily.   ezetimibe (ZETIA) 10 MG tablet TAKE 1 TABLET BY MOUTH DAILY   gabapentin (NEURONTIN) 600 MG tablet TAKE 1 TABLET BY MOUTH  TWICE A DAY   Insulin Glargine 300 UNIT/ML SOPN Inject 0-40 Units into the skin See admin instructions. Sliding scale insulin per patient  3 units if blood sugar is greater than 150 during the day, if blood sugars are less= no insulin 40 units if blood sugar is greater than 150 at night, if blood sugars are less=no insulin   insulin glargine, 2 Unit Dial, (TOUJEO MAX SOLOSTAR) 300 UNIT/ML Solostar Pen Inject 40 Units into the skin at bedtime.   losartan-hydrochlorothiazide (HYZAAR) 50-12.5 MG tablet Take 1 tablet by mouth daily.   Magnesium 400 MG CAPS Take by mouth.   meclizine (ANTIVERT) 25 MG tablet Take 1 tablet (25 mg total) by mouth 3 (three) times daily as needed for dizziness.   metFORMIN (GLUCOPHAGE) 1000 MG tablet TAKE  1 TABLET TWICE DAILY   NEXLETOL 180 MG TABS TAKE 1 TABLET BY MOUTH DAILY   Omega-3 Fatty Acids (FISH OIL) 1200 MG CAPS Take 1,200 mg by mouth 2 (two) times daily.   omeprazole (PRILOSEC) 20 MG capsule TAKE 1 CAPSULE EVERY DAY   vitamin B-12 (CYANOCOBALAMIN) 500 MCG tablet Take 500 mcg by mouth daily.   [DISCONTINUED] empagliflozin (JARDIANCE) 10 MG TABS tablet Take 1 tablet (10 mg total) by mouth daily. (Patient not taking: Reported on 04/27/2023)   No facility-administered medications prior to visit.    Review of Systems  Constitutional: Negative.  Negative for fever, malaise/fatigue and weight loss.  HENT: Negative.  Negative for sore throat.   Eyes: Negative.   Respiratory: Negative.  Negative for cough and shortness of breath.   Cardiovascular: Negative.  Negative for chest pain, palpitations and leg swelling.  Gastrointestinal: Negative.  Negative for abdominal pain, constipation, diarrhea, heartburn, nausea and vomiting.  Genitourinary: Negative.  Negative for dysuria and flank pain.  Musculoskeletal: Negative.  Negative for joint pain and myalgias.  Skin: Negative.   Neurological: Negative.  Negative for dizziness and headaches.  Endo/Heme/Allergies:  Negative.   Psychiatric/Behavioral: Negative.  Negative for depression and suicidal ideas. The patient is not nervous/anxious.        Objective:   BP 138/82   Pulse (!) 52   Ht 5\' 11"  (1.803 m)   Wt 183 lb 6.4 oz (83.2 kg)   SpO2 97%   BMI 25.58 kg/m   Vitals:   04/27/23 0911  BP: 138/82  Pulse: (!) 52  Height: 5\' 11"  (1.803 m)  Weight: 183 lb 6.4 oz (83.2 kg)  SpO2: 97%  BMI (Calculated): 25.59    Physical Exam Vitals and nursing note reviewed.  Constitutional:      Appearance: Normal appearance.  HENT:     Head: Normocephalic and atraumatic.     Nose: Nose normal.     Mouth/Throat:     Mouth: Mucous membranes are moist.     Pharynx: Oropharynx is clear.  Eyes:     Conjunctiva/sclera: Conjunctivae normal.     Pupils: Pupils are equal, round, and reactive to light.  Cardiovascular:     Rate and Rhythm: Normal rate and regular rhythm.     Pulses: Normal pulses.     Heart sounds: Normal heart sounds.  Pulmonary:     Effort: Pulmonary effort is normal.     Breath sounds: Normal breath sounds.  Abdominal:     General: Bowel sounds are normal.     Palpations: Abdomen is soft.  Musculoskeletal:        General: Normal range of motion.     Cervical back: Normal range of motion.  Skin:    General: Skin is warm and dry.  Neurological:     General: No focal deficit present.     Mental Status: He is alert and oriented to person, place, and time.  Psychiatric:        Mood and Affect: Mood normal.        Behavior: Behavior normal.        Judgment: Judgment normal.      Results for orders placed or performed in visit on 04/27/23  POCT CBG (Fasting - Glucose)  Result Value Ref Range   Glucose Fasting, POC 80 70 - 99 mg/dL  POC CREATINE & ALBUMIN,URINE  Result Value Ref Range   Microalbumin Ur, POC 150 mg/L   Creatinine, POC 100 mg/dL   Albumin/Creatinine Ratio,  Urine, POC >300     Recent Results (from the past 2160 hours)  Hemoglobin A1c     Status:  Abnormal   Collection Time: 04/20/23  8:59 AM  Result Value Ref Range   Hgb A1c MFr Bld 7.3 (H) 4.8 - 5.6 %    Comment:          Prediabetes: 5.7 - 6.4          Diabetes: >6.4          Glycemic control for adults with diabetes: <7.0    Est. average glucose Bld gHb Est-mCnc 163 mg/dL  TSH     Status: None   Collection Time: 04/20/23  8:59 AM  Result Value Ref Range   TSH 4.280 0.450 - 4.500 uIU/mL  CMP14+EGFR     Status: Abnormal   Collection Time: 04/20/23  8:59 AM  Result Value Ref Range   Glucose 83 70 - 99 mg/dL   BUN 30 (H) 8 - 27 mg/dL   Creatinine, Ser 6.76 (H) 0.76 - 1.27 mg/dL   eGFR 53 (L) >19 JK/DTO/6.71   BUN/Creatinine Ratio 22 10 - 24   Sodium 144 134 - 144 mmol/L   Potassium 5.3 (H) 3.5 - 5.2 mmol/L   Chloride 106 96 - 106 mmol/L   CO2 21 20 - 29 mmol/L   Calcium 9.1 8.6 - 10.2 mg/dL   Total Protein 6.6 6.0 - 8.5 g/dL   Albumin 4.3 3.8 - 4.8 g/dL   Globulin, Total 2.3 1.5 - 4.5 g/dL   Bilirubin Total 0.2 0.0 - 1.2 mg/dL   Alkaline Phosphatase 43 (L) 44 - 121 IU/L   AST 20 0 - 40 IU/L   ALT 8 0 - 44 IU/L  Lipid panel     Status: Abnormal   Collection Time: 04/20/23  8:59 AM  Result Value Ref Range   Cholesterol, Total 116 100 - 199 mg/dL   Triglycerides 72 0 - 149 mg/dL   HDL 38 (L) >24 mg/dL   VLDL Cholesterol Cal 15 5 - 40 mg/dL   LDL Chol Calc (NIH) 63 0 - 99 mg/dL   Chol/HDL Ratio 3.1 0.0 - 5.0 ratio    Comment:                                   T. Chol/HDL Ratio                                             Men  Women                               1/2 Avg.Risk  3.4    3.3                                   Avg.Risk  5.0    4.4                                2X Avg.Risk  9.6    7.1  3X Avg.Risk 23.4   11.0   POCT CBG (Fasting - Glucose)     Status: None   Collection Time: 04/27/23  9:19 AM  Result Value Ref Range   Glucose Fasting, POC 80 70 - 99 mg/dL  POC CREATINE & ALBUMIN,URINE     Status: None   Collection Time:  04/27/23  9:43 AM  Result Value Ref Range   Microalbumin Ur, POC 150 mg/L   Creatinine, POC 100 mg/dL   Albumin/Creatinine Ratio, Urine, POC >300       Assessment & Plan:  Patient advised to continue taking his medications regularly along with strict diet control. Problem List Items Addressed This Visit     Hypertension associated with diabetes (HCC) - Primary   Relevant Medications   empagliflozin (JARDIANCE) 25 MG TABS tablet   Coronary disease   Diabetes mellitus type 2, uncomplicated (HCC)   Relevant Medications   empagliflozin (JARDIANCE) 25 MG TABS tablet   Other Relevant Orders   POCT CBG (Fasting - Glucose) (Completed)   POC CREATINE & ALBUMIN,URINE (Completed)   Chronic kidney disease   Combined hyperlipidemia associated with type 2 diabetes mellitus (HCC)   Relevant Medications   empagliflozin (JARDIANCE) 25 MG TABS tablet    Return in about 4 months (around 08/27/2023).   Total time spent: 30 minutes  Margaretann Loveless, MD  04/27/2023   This document may have been prepared by [facility] Voice Recognition software and as such may include unintentional dictation errors.

## 2023-04-30 DIAGNOSIS — I499 Cardiac arrhythmia, unspecified: Secondary | ICD-10-CM | POA: Diagnosis not present

## 2023-04-30 DIAGNOSIS — R197 Diarrhea, unspecified: Secondary | ICD-10-CM | POA: Diagnosis not present

## 2023-04-30 DIAGNOSIS — R112 Nausea with vomiting, unspecified: Secondary | ICD-10-CM | POA: Diagnosis not present

## 2023-04-30 DIAGNOSIS — Z03818 Encounter for observation for suspected exposure to other biological agents ruled out: Secondary | ICD-10-CM | POA: Diagnosis not present

## 2023-05-25 DIAGNOSIS — D631 Anemia in chronic kidney disease: Secondary | ICD-10-CM | POA: Diagnosis not present

## 2023-05-25 DIAGNOSIS — N1831 Chronic kidney disease, stage 3a: Secondary | ICD-10-CM | POA: Diagnosis not present

## 2023-05-25 DIAGNOSIS — E785 Hyperlipidemia, unspecified: Secondary | ICD-10-CM | POA: Diagnosis not present

## 2023-05-25 DIAGNOSIS — E1129 Type 2 diabetes mellitus with other diabetic kidney complication: Secondary | ICD-10-CM | POA: Diagnosis not present

## 2023-05-25 DIAGNOSIS — I10 Essential (primary) hypertension: Secondary | ICD-10-CM | POA: Diagnosis not present

## 2023-05-25 DIAGNOSIS — E875 Hyperkalemia: Secondary | ICD-10-CM | POA: Diagnosis not present

## 2023-05-25 DIAGNOSIS — R809 Proteinuria, unspecified: Secondary | ICD-10-CM | POA: Diagnosis not present

## 2023-06-11 ENCOUNTER — Other Ambulatory Visit: Payer: Self-pay | Admitting: Family

## 2023-06-15 ENCOUNTER — Encounter: Payer: Self-pay | Admitting: Cardiovascular Disease

## 2023-06-15 ENCOUNTER — Ambulatory Visit (INDEPENDENT_AMBULATORY_CARE_PROVIDER_SITE_OTHER): Payer: Medicare Other | Admitting: Cardiovascular Disease

## 2023-06-15 VITALS — BP 105/50 | HR 66 | Ht 71.0 in | Wt 180.8 lb

## 2023-06-15 DIAGNOSIS — I25119 Atherosclerotic heart disease of native coronary artery with unspecified angina pectoris: Secondary | ICD-10-CM

## 2023-06-15 DIAGNOSIS — I6523 Occlusion and stenosis of bilateral carotid arteries: Secondary | ICD-10-CM

## 2023-06-15 DIAGNOSIS — I152 Hypertension secondary to endocrine disorders: Secondary | ICD-10-CM

## 2023-06-15 DIAGNOSIS — E1159 Type 2 diabetes mellitus with other circulatory complications: Secondary | ICD-10-CM

## 2023-06-15 DIAGNOSIS — I50812 Chronic right heart failure: Secondary | ICD-10-CM

## 2023-06-15 DIAGNOSIS — E782 Mixed hyperlipidemia: Secondary | ICD-10-CM | POA: Diagnosis not present

## 2023-06-15 NOTE — Progress Notes (Signed)
 Cardiology Office Note   Date:  06/15/2023   ID:  Lamont, Shells 06/27/1945, MRN [id number]  PCP:  Aisha Hove, MD  Cardiologist:  Debborah Fairly, MD      History of Present Illness: Erik Collins is a 78 y.o. male who presents for  Chief Complaint  Patient presents with   Follow-up    3 month    No complaints, no dizziness chest pain or shortness of breath.      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   R nose lateral supratip - referred for radiation oncology   Cancer [facility])    hx lung cancer   CHF (congestive heart failure) (HCC)    Complication of anesthesia    knee surgery caused arrythmia requiring a temporary pacemaker   COPD (chronic obstructive pulmonary disease) (HCC)    Coronary artery disease    s/p CABG   Diabetes mellitus without complication (HCC)    Diverticulosis    Duodenitis    Dysrhythmia    during total knee replacement patient required a temporary pacemaker   GERD (gastroesophageal reflux disease)    Hypercholesterolemia    Hyperplastic colon polyp    Hypertension    Internal hemorrhoids    Lung cancer (HCC)    Lung cancer (HCC)    Myocardial infarction (HCC)    09/2009 f/up with Dr. Debborah Fairly   Sleep apnea      Past Surgical History:  Procedure Laterality Date   BACK SURGERY     CATARACT EXTRACTION W/PHACO Left 11/09/2019   Procedure: CATARACT EXTRACTION PHACO AND INTRAOCULAR LENS PLACEMENT (IOC) LEFT DIABETIC MALYUGIN 5.91 01:01.0 9.6%;  Surgeon: Annell Kidney, MD;  Location: [facility]ERY [facility];  Service: Ophthalmology;  Laterality: Left;   CATARACT EXTRACTION W/PHACO Right 12/07/2019   Procedure: CATARACT EXTRACTION PHACO AND INTRAOCULAR LENS PLACEMENT (IOC) RIGHT DIABETIC MALYUGIN 6.38 01:05.1 9.8%;  Surgeon: Annell Kidney, MD;  Location: [facility] SURGERY [facility]TR;  Service: Ophthalmology;  Laterality: Right;   COLONOSCOPY N/A 11/23/2019   Procedure:  COLONOSCOPY;  Surgeon: Toledo, Alphonsus Jeans, MD;  Location: [facility] ENDOSCOPY;  Service: Gastroenterology;  Laterality: N/A;   COLONOSCOPY WITH PROPOFOL  N/A 11/09/2014   Procedure: COLONOSCOPY WITH PROPOFOL ;  Surgeon: Luella Sager, MD;  Location: [facility] ENDOSCOPY;  Service: Endoscopy;  Laterality: N/A;   CORONARY ANGIOPLASTY     CORONARY ARTERY BYPASS GRAFT  09/2009   quadruple, done at [facility]   CORONARY ARTERY BYPASS GRAFT     ESOPHAGOGASTRODUODENOSCOPY (EGD) WITH PROPOFOL  N/A 11/09/2014   Procedure: ESOPHAGOGASTRODUODENOSCOPY (EGD) WITH PROPOFOL ;  Surgeon: Luella Sager, MD;  Location: [facility] ENDOSCOPY;  Service: Endoscopy;  Laterality: N/A;   left lower lobectomy  09/2009   at [facility] LAMINECTOMY/DECOMPRESSION MICRODISCECTOMY  12/17/2011   Procedure: LUMBAR LAMINECTOMY/DECOMPRESSION MICRODISCECTOMY 1 LEVEL;  Surgeon: Isadora Mar, MD;  Location: [facility] NEURO ORS;  Service: Neurosurgery;  Laterality: Right;  Right Lumbar five-sacral one microdiscectomy   TEMPORARY PACEMAKER Right 07/10/2017   Procedure: TEMPORARY PACEMAKER;  Surgeon: Percival Brace, MD;  Location: [facility] INVASIVE CV LAB;  Service: Cardiovascular;  Laterality: Right;   TOTAL KNEE ARTHROPLASTY Left 07/08/2017   Procedure: TOTAL KNEE ARTHROPLASTY;  Surgeon: Marlynn Singer, MD;  Location: [facility] ORS;  Service: Orthopedics;  Laterality: Left;     Current Outpatient Medications  Medication Sig Dispense Refill   acetaminophen  (TYLENOL ) 650 MG CR tablet Take 1,300 mg by mouth 2 (  two) times daily.     aspirin  EC 81 MG tablet Take 81 mg by mouth daily.     carvedilol  (COREG ) 12.5 MG tablet TAKE 1 TABLET BY MOUTH 2 TIMES DAILY WITH A MEAL 180 tablet 3   chlorthalidone (HYGROTON) 25 MG tablet Take 25 mg by mouth daily.     empagliflozin  (JARDIANCE ) 25 MG TABS tablet Take 25 mg by mouth daily.     ezetimibe  (ZETIA ) 10 MG tablet TAKE 1 TABLET BY MOUTH DAILY 90 tablet 1   gabapentin  (NEURONTIN ) 600 MG tablet TAKE 1 TABLET BY MOUTH  TWICE A DAY 180 tablet 3   insulin  glargine, 2 Unit Dial, (TOUJEO  MAX SOLOSTAR) 300 UNIT/ML Solostar Pen Inject 40 Units into the skin at bedtime. 90 mL 1   losartan -hydrochlorothiazide (HYZAAR) 50-12.5 MG tablet Take 1 tablet by mouth daily. 30 tablet 11   Magnesium  400 MG CAPS Take by mouth.     metFORMIN  (GLUCOPHAGE ) 1000 MG tablet TAKE 1 TABLET TWICE DAILY 180 tablet 3   NEXLETOL  180 MG TABS TAKE 1 TABLET BY MOUTH DAILY 90 tablet 3   olmesartan (BENICAR) 20 MG tablet Take 20 mg by mouth daily.     Omega-3 Fatty Acids (FISH OIL) 1200 MG CAPS Take 1,200 mg by mouth 2 (two) times daily.     omeprazole  (PRILOSEC) 20 MG capsule TAKE 1 CAPSULE EVERY DAY 90 capsule 3   vitamin B-12 (CYANOCOBALAMIN ) 500 MCG tablet Take 500 mcg by mouth daily.     No current facility-administered medications for this visit.    Allergies:   Spironolactone and Statins    Social History:   reports that he quit smoking about 13 years ago. His smoking use included cigarettes. He has never used smokeless tobacco. He reports that he does not drink alcohol and does not use drugs.   Family History:  family history includes Diabetes in his mother; Heart attack in his father; Heart disease in his father and mother; Stroke in his father.    ROS:     Review of Systems  Constitutional: Negative.   HENT: Negative.    Eyes: Negative.   Respiratory: Negative.    Gastrointestinal: Negative.   Genitourinary: Negative.   Musculoskeletal: Negative.   Skin: Negative.   Neurological: Negative.   Endo/Heme/Allergies: Negative.   Psychiatric/Behavioral: Negative.    All other systems reviewed and are negative.     All other systems are reviewed and negative.    PHYSICAL EXAM: VS:  BP (!) 105/50   Pulse 66   Ht 5\' 11"  (1.803 m)   Wt 180 lb 12.8 oz (82 kg)   SpO2 94%   BMI 25.22 kg/m  , BMI Body mass index is 25.22 kg/m. Last weight:  Wt Readings from Last 3 Encounters:  06/15/23 180 lb 12.8 oz (82 kg)   04/27/23 183 lb 6.4 oz (83.2 kg)  04/06/23 180 lb (81.6 kg)     Physical Exam Vitals reviewed.  Constitutional:      Appearance: Normal appearance. He is normal weight.  HENT:     Head: Normocephalic.     Nose: Nose normal.     Mouth/Throat:     Mouth: Mucous membranes are moist.  Eyes:     Pupils: Pupils are equal, round, and reactive to light.  Cardiovascular:     Rate and Rhythm: Normal rate and regular rhythm.     Pulses: Normal pulses.     Heart sounds: Normal heart sounds.  Pulmonary:  Effort: Pulmonary effort is normal.  Abdominal:     General: Abdomen is flat. Bowel sounds are normal.  Musculoskeletal:        General: Normal range of motion.     Cervical back: Normal range of motion.  Skin:    General: Skin is warm.  Neurological:     General: No focal deficit present.     Mental Status: He is alert.  Psychiatric:        Mood and Affect: Mood normal.       EKG:   Recent Labs: 09/15/2022: Hemoglobin 12.2; Platelets 244 04/20/2023: ALT 8; BUN 30; Creatinine, Ser 1.36; Potassium 5.3; Sodium 144; TSH 4.280    Lipid Panel    Component Value Date/Time   CHOL 116 04/20/2023 0859   TRIG 72 04/20/2023 0859   HDL 38 (L) 04/20/2023 0859   CHOLHDL 3.1 04/20/2023 0859   LDLCALC 63 04/20/2023 0859      Other studies Reviewed: Additional studies/ records that were reviewed today include:  Review of the above records demonstrates:       No data to display            ASSESSMENT AND PLAN:    ICD-10-CM   1. Bilateral carotid artery stenosis  I65.23     2. Hypertension associated with diabetes (HCC)  E11.59    I15.2    Blood pressure is on the low side but denies any dizziness.    3. Chronic right-sided congestive heart failure (HCC)  I50.812     4. Coronary artery disease involving native coronary artery of native heart with angina pectoris (HCC)  I25.119    Patient denies any chest pain shortness of breath or dizziness and is feeling good.     5. Mixed hyperlipidemia  E78.2        Problem List Items Addressed This Visit       Cardiovascular and Mediastinum   Carotid stenosis - Primary   Relevant Medications   olmesartan (BENICAR) 20 MG tablet   chlorthalidone (HYGROTON) 25 MG tablet   Hypertension associated with diabetes (HCC)   Relevant Medications   olmesartan (BENICAR) 20 MG tablet   chlorthalidone (HYGROTON) 25 MG tablet   Congestive heart failure (HCC)   Relevant Medications   olmesartan (BENICAR) 20 MG tablet   chlorthalidone (HYGROTON) 25 MG tablet   Coronary disease   Relevant Medications   olmesartan (BENICAR) 20 MG tablet   chlorthalidone (HYGROTON) 25 MG tablet     Other   Hyperlipidemia   Relevant Medications   olmesartan (BENICAR) 20 MG tablet   chlorthalidone (HYGROTON) 25 MG tablet       Disposition:   Return in about 3 months (around 09/14/2023).    Total time spent: 45 minutes  Signed,  Debborah Fairly, MD  06/15/2023 10:00 AM    [facility]

## 2023-07-30 ENCOUNTER — Ambulatory Visit: Payer: Medicare Other | Admitting: Dermatology

## 2023-07-30 ENCOUNTER — Encounter: Payer: Self-pay | Admitting: Dermatology

## 2023-07-30 DIAGNOSIS — Z1283 Encounter for screening for malignant neoplasm of skin: Secondary | ICD-10-CM | POA: Diagnosis not present

## 2023-07-30 DIAGNOSIS — W908XXA Exposure to other nonionizing radiation, initial encounter: Secondary | ICD-10-CM | POA: Diagnosis not present

## 2023-07-30 DIAGNOSIS — L821 Other seborrheic keratosis: Secondary | ICD-10-CM | POA: Diagnosis not present

## 2023-07-30 DIAGNOSIS — D229 Melanocytic nevi, unspecified: Secondary | ICD-10-CM

## 2023-07-30 DIAGNOSIS — D692 Other nonthrombocytopenic purpura: Secondary | ICD-10-CM | POA: Diagnosis not present

## 2023-07-30 DIAGNOSIS — L578 Other skin changes due to chronic exposure to nonionizing radiation: Secondary | ICD-10-CM

## 2023-07-30 DIAGNOSIS — D1801 Hemangioma of skin and subcutaneous tissue: Secondary | ICD-10-CM | POA: Diagnosis not present

## 2023-07-30 DIAGNOSIS — Z85828 Personal history of other malignant neoplasm of skin: Secondary | ICD-10-CM | POA: Diagnosis not present

## 2023-07-30 DIAGNOSIS — L570 Actinic keratosis: Secondary | ICD-10-CM

## 2023-07-30 DIAGNOSIS — L820 Inflamed seborrheic keratosis: Secondary | ICD-10-CM | POA: Diagnosis not present

## 2023-07-30 DIAGNOSIS — L814 Other melanin hyperpigmentation: Secondary | ICD-10-CM | POA: Diagnosis not present

## 2023-07-30 NOTE — Progress Notes (Signed)
 Follow-Up Visit   Subjective  Erik Collins is a 78 y.o. male who presents for the following: Skin Cancer Screening and Full Body Skin Exam Hx of bcc   The patient presents for Total-Body Skin Exam (TBSE) for skin cancer screening and mole check. The patient has spots, moles and lesions to be evaluated, some may be new or changing and the patient may have concern these could be cancer.  The following portions of the chart were reviewed this encounter and updated as appropriate: medications, allergies, medical history  Review of Systems:  No other skin or systemic complaints except as noted in HPI or Assessment and Plan.  Objective  Well appearing patient in no apparent distress; mood and affect are within normal limits.  A full examination was performed including scalp, head, eyes, ears, nose, lips, neck, chest, axillae, abdomen, back, buttocks, bilateral upper extremities, bilateral lower extremities, hands, feet, fingers, toes, fingernails, and toenails. All findings within normal limits unless otherwise noted below.   Relevant physical exam findings are noted in the Assessment and Plan.  left medial cheek x 1, face and ears x 9 (10) Erythematous thin papules/macules with gritty scale.  scalp x 2 (2) Erythematous stuck-on, waxy papule or plaque  Assessment & Plan   SKIN CANCER SCREENING PERFORMED TODAY.  ACTINIC DAMAGE - Chronic condition, secondary to cumulative UV/sun exposure - diffuse scaly erythematous macules with underlying dyspigmentation - Recommend daily broad spectrum sunscreen SPF 30+ to sun-exposed areas, reapply every 2 hours as needed.  - Staying in the shade or wearing long sleeves, sun glasses (UVA+UVB protection) and wide brim hats (4-inch brim around the entire circumference of the hat) are also recommended for sun protection.  - Call for new or changing lesions.  Purpura - Chronic; persistent and recurrent.  Treatable, but not curable. - Violaceous  macules and patches - Benign - Related to trauma, age, sun damage and/or use of blood thinners, chronic use of topical and/or oral steroids - Observe - Can use OTC arnica containing moisturizer such as Dermend Bruise Formula if desired - Call for worsening or other concerns  LENTIGINES, SEBORRHEIC KERATOSES, HEMANGIOMAS - Benign normal skin lesions - Benign-appearing - Call for any changes  MELANOCYTIC NEVI - Tan-brown and/or pink-flesh-colored symmetric macules and papules - Benign appearing on exam today - Observation - Call clinic for new or changing moles - Recommend daily use of broad spectrum spf 30+ sunscreen to sun-exposed areas.   HISTORY OF BASAL CELL CARCINOMA OF THE SKIN - R nose lat supratip,  S/P Radioation tx.  Clear today. nasal mucosa clear and normal appearing. - No evidence of recurrence today, nasal mucosa clear and normal appearing. - Recommend regular full body skin exams - Recommend daily broad spectrum sunscreen SPF 30+ to sun-exposed areas, reapply every 2 hours as needed.  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 They are chronic with expected duration over 1 year. A portion of actinic keratoses will progress to squamous cell carcinoma of the skin. It is not possible to reliably predict which spots will progress to skin cancer and so treatment is recommended to prevent development of skin cancer.  Recommend daily broad spectrum sunscreen SPF 30+ to sun-exposed areas, reapply every 2 hours as needed.  Recommend staying in the shade  or wearing long sleeves, sun glasses (UVA+UVB protection) and wide brim hats (4-inch brim around the entire circumference of the hat). Call for new or changing lesions. Destruction of lesion - left medial cheek x  1, face and ears x 9 (10) Complexity: simple   Destruction method: cryotherapy   Informed consent: discussed and consent obtained   Timeout:  patient name, date of birth, surgical site, and procedure verified Lesion destroyed using liquid nitrogen: Yes   Region frozen until ice ball extended beyond lesion: Yes   Outcome: patient tolerated procedure well with no complications   Post-procedure details: wound care instructions given   INFLAMED SEBORRHEIC KERATOSIS (2) scalp x 2 (2) Symptomatic, irritating, patient would like treated. Destruction of lesion - scalp x 2 (2) Complexity: simple   Destruction method: cryotherapy   Informed consent: discussed and consent obtained   Timeout:  patient name, date of birth, surgical site, and procedure verified Lesion destroyed using liquid nitrogen: Yes   Region frozen until ice ball extended beyond lesion: Yes   Outcome: patient tolerated procedure well with no complications   Post-procedure details: wound care instructions given   Return in about 1 year (around 07/29/2024) for TBSE.  IRandee Busing, CMA, am acting as scribe for Celine Collard, MD.   Documentation: I have reviewed the above documentation for accuracy and completeness, and I agree with the above.  Celine Collard, MD

## 2023-07-30 NOTE — Patient Instructions (Addendum)
Seborrheic Keratosis  What causes seborrheic keratoses? Seborrheic keratoses are harmless, common skin growths that first appear during adult life.  As time goes by, more growths appear.  Some people may develop a large number of them.  Seborrheic keratoses appear on both covered and uncovered body parts.  They are not caused by sunlight.  The tendency to develop seborrheic keratoses can be inherited.  They vary in color from skin-colored to gray, brown, or even black.  They can be either smooth or have a rough, warty surface.   Seborrheic keratoses are superficial and look as if they were stuck on the skin.  Under the microscope this type of keratosis looks like layers upon layers of skin.  That is why at times the top layer may seem to fall off, but the rest of the growth remains and re-grows.    Treatment Seborrheic keratoses do not need to be treated, but can easily be removed in the office.  Seborrheic keratoses often cause symptoms when they rub on clothing or jewelry.  Lesions can be in the way of shaving.  If they become inflamed, they can cause itching, soreness, or burning.  Removal of a seborrheic keratosis can be accomplished by freezing, burning, or surgery. If any spot bleeds, scabs, or grows rapidly, please return to have it checked, as these can be an indication of a skin cancer.  Cryotherapy Aftercare  Wash gently with soap and water everyday.   Apply Vaseline and Band-Aid daily until healed.   Actinic keratoses are precancerous spots that appear secondary to cumulative UV radiation exposure/sun exposure over time. They are chronic with expected duration over 1 year. A portion of actinic keratoses will progress to squamous cell carcinoma of the skin. It is not possible to reliably predict which spots will progress to skin cancer and so treatment is recommended to prevent development of skin cancer.  Recommend daily broad spectrum sunscreen SPF 30+ to sun-exposed areas, reapply every  2 hours as needed.  Recommend staying in the shade or wearing long sleeves, sun glasses (UVA+UVB protection) and wide brim hats (4-inch brim around the entire circumference of the hat). Call for new or changing lesions.   Melanoma ABCDEs  Melanoma is the most dangerous type of skin cancer, and is the leading cause of death from skin disease.  You are more likely to develop melanoma if you: Have light-colored skin, light-colored eyes, or red or blond hair Spend a lot of time in the sun Tan regularly, either outdoors or in a tanning bed Have had blistering sunburns, especially during childhood Have a close family member who has had a melanoma Have atypical moles or large birthmarks  Early detection of melanoma is key since treatment is typically straightforward and cure rates are extremely high if we catch it early.   The first sign of melanoma is often a change in a mole or a new dark spot.  The ABCDE system is a way of remembering the signs of melanoma.  A for asymmetry:  The two halves do not match. B for border:  The edges of the growth are irregular. C for color:  A mixture of colors are present instead of an even brown color. D for diameter:  Melanomas are usually (but not always) greater than 6mm - the size of a pencil eraser. E for evolution:  The spot keeps changing in size, shape, and color.  Please check your skin once per month between visits. You can use a small mirror  in front and a large mirror behind you to keep an eye on the back side or your body.   If you see any new or changing lesions before your next follow-up, please call to schedule a visit.  Please continue daily skin protection including broad spectrum sunscreen SPF 30+ to sun-exposed areas, reapplying every 2 hours as needed when you're outdoors.   Staying in the shade or wearing long sleeves, sun glasses (UVA+UVB protection) and wide brim hats (4-inch brim around the entire circumference of the hat) are also  recommended for sun protection.    Due to recent changes in healthcare laws, you may see results of your pathology and/or laboratory studies on MyChart before the doctors have had a chance to review them. We understand that in some cases there may be results that are confusing or concerning to you. Please understand that not all results are received at the same time and often the doctors may need to interpret multiple results in order to provide you with the best plan of care or course of treatment. Therefore, we ask that you please give [place] 2 business days to thoroughly review all your results before contacting the office for clarification. Should we see a critical lab result, you will be contacted sooner.   If You Need Anything After Your Visit  If you have any questions or concerns for your doctor, please call our main line at [phone] and press option 4 to reach your doctor's medical assistant. If no one answers, please leave a voicemail as directed and we will return your call as soon as possible. Messages left after 4 pm will be answered the following business day.   You may also send [place] a message via MyChart. We typically respond to MyChart messages within 1-2 business days.  For prescription refills, please ask your pharmacy to contact our office. Our fax number is [phone].  If you have an urgent issue when the clinic is closed that cannot wait until the next business day, you can page your doctor at the number below.    Please note that while we do our best to be available for urgent issues outside of office hours, we are not available 24/7.   If you have an urgent issue and are unable to reach [place], you may choose to seek medical care at your doctor's office, retail clinic, urgent care center, or emergency room.  If you have a medical emergency, please immediately call 911 or go to the emergency department.  Pager Numbers  - Dr. Gwen Pounds: [phone]  - Dr. Roseanne Reno:  [phone]  - Dr. Katrinka Blazing: [phone]   In the event of inclement weather, please call our main line at [phone] for an update on the status of any delays or closures.  Dermatology Medication Tips: Please keep the boxes that topical medications come in in order to help keep track of the instructions about where and how to use these. Pharmacies typically print the medication instructions only on the boxes and not directly on the medication tubes.   If your medication is too expensive, please contact our office at [phone] option 4 or send [place] a message through MyChart.   We are unable to tell what your co-pay for medications will be in advance as this is different depending on your insurance coverage. However, we may be able to find a substitute medication at lower cost or fill out paperwork to get insurance to cover a needed medication.   If a prior authorization  is required to get your medication covered by your insurance company, please allow [place] 1-2 business days to complete this process.  Drug prices often vary depending on where the prescription is filled and some pharmacies may offer cheaper prices.  The website www.goodrx.com contains coupons for medications through different pharmacies. The prices here do not account for what the cost may be with help from insurance (it may be cheaper with your insurance), but the website can give you the price if you did not use any insurance.  - You can print the associated coupon and take it with your prescription to the pharmacy.  - You may also stop by our office during regular business hours and pick up a GoodRx coupon card.  - If you need your prescription sent electronically to a different pharmacy, notify our office through [facility] or by phone at [phone] option 4.     Si Usted Necesita Algo Despus de Su Visita  Tambin puede enviarnos un mensaje a travs de Clinical cytogeneticist. Por lo general respondemos a los mensajes de  MyChart en el transcurso de 1 a 2 das hbiles.  Para renovar recetas, por favor pida a su farmacia que se ponga en contacto con nuestra oficina. Annie Sable de fax es [place] [phone].  Si tiene un asunto urgente cuando la clnica est cerrada y que no puede esperar hasta el siguiente da hbil, puede llamar/localizar a su doctor(a) al nmero que aparece a continuacin.   Por favor, tenga en cuenta que aunque hacemos todo lo posible para estar disponibles para asuntos urgentes fuera del horario de [place], no estamos disponibles las 24 horas del da, los 7 [address]  [address] de la [place].   Si tiene un problema urgente y no puede comunicarse con nosotros, puede optar por buscar atencin mdica  en el consultorio de su doctor(a), en una clnica privada, en un centro de atencin urgente o en una sala de emergencias.  Si tiene Engineer, drilling, por favor llame inmediatamente al 911 o vaya a la sala de emergencias.  Nmeros de bper  - Dr. Gwen Pounds: [phone]  - Dra. Roseanne Reno: [phone]  - Dr. Katrinka Blazing: [phone]   En caso de inclemencias del tiempo, por favor llame a Lacy Duverney principal al [phone] para una actualizacin sobre el [place] de cualquier retraso o cierre.  Consejos para la medicacin en dermatologa: Por favor, guarde las cajas en las que vienen los medicamentos de uso tpico para ayudarle a seguir las instrucciones sobre dnde y cmo usarlos. Las farmacias generalmente imprimen las instrucciones del medicamento slo en las cajas y no directamente en los tubos del [place].   Si su medicamento es muy caro, por favor, pngase en contacto con Rolm Gala llamando al [phone] y presione la opcin 4 o envenos un mensaje a travs de Clinical cytogeneticist.   No podemos decirle cul ser su copago por los medicamentos por adelantado ya que esto es diferente dependiendo de la cobertura de su seguro. Sin embargo, es posible que podamos encontrar un medicamento sustituto a Advice worker un formulario para que el seguro cubra el medicamento que se considera necesario.   Si se requiere una autorizacin previa para que su compaa de seguros [place] su medicamento, por favor permtanos de 1 a 2 das hbiles para completar [address]  Los precios de los medicamentos varan con frecuencia dependiendo del Environmental consultant de dnde se surte la receta y alguna farmacias pueden ofrecer precios ms baratos.  El sitio web www.goodrx.com tiene cupones para  medicamentos de [employer]. Los precios aqu no tienen en cuenta lo que podra costar con la ayuda del seguro (puede ser ms barato con su seguro), pero el sitio web puede darle el precio si no utiliz Tourist information centre manager.  - Puede imprimir el cupn correspondiente y llevarlo con su receta a la farmacia.  - Tambin puede pasar por nuestra oficina durante el horario de atencin regular y Education officer, museum una tarjeta de cupones de GoodRx.  - Si necesita que su receta se enve electrnicamente a una farmacia diferente, informe a nuestra oficina a travs de MyChart de [place] o por telfono llamando al [phone] y presione la opcin 4.

## 2023-08-17 DIAGNOSIS — D631 Anemia in chronic kidney disease: Secondary | ICD-10-CM | POA: Diagnosis not present

## 2023-08-17 DIAGNOSIS — E1129 Type 2 diabetes mellitus with other diabetic kidney complication: Secondary | ICD-10-CM | POA: Diagnosis not present

## 2023-08-17 DIAGNOSIS — E785 Hyperlipidemia, unspecified: Secondary | ICD-10-CM | POA: Diagnosis not present

## 2023-08-17 DIAGNOSIS — E875 Hyperkalemia: Secondary | ICD-10-CM | POA: Diagnosis not present

## 2023-08-17 DIAGNOSIS — R8290 Unspecified abnormal findings in urine: Secondary | ICD-10-CM | POA: Diagnosis not present

## 2023-08-17 DIAGNOSIS — Z794 Long term (current) use of insulin: Secondary | ICD-10-CM | POA: Diagnosis not present

## 2023-08-17 DIAGNOSIS — N1831 Chronic kidney disease, stage 3a: Secondary | ICD-10-CM | POA: Diagnosis not present

## 2023-08-17 DIAGNOSIS — E1140 Type 2 diabetes mellitus with diabetic neuropathy, unspecified: Secondary | ICD-10-CM | POA: Diagnosis not present

## 2023-08-17 DIAGNOSIS — R809 Proteinuria, unspecified: Secondary | ICD-10-CM | POA: Diagnosis not present

## 2023-08-17 DIAGNOSIS — I10 Essential (primary) hypertension: Secondary | ICD-10-CM | POA: Diagnosis not present

## 2023-08-17 DIAGNOSIS — L851 Acquired keratosis [keratoderma] palmaris et plantaris: Secondary | ICD-10-CM | POA: Diagnosis not present

## 2023-08-17 DIAGNOSIS — B351 Tinea unguium: Secondary | ICD-10-CM | POA: Diagnosis not present

## 2023-08-25 DIAGNOSIS — I10 Essential (primary) hypertension: Secondary | ICD-10-CM | POA: Diagnosis not present

## 2023-08-25 DIAGNOSIS — E785 Hyperlipidemia, unspecified: Secondary | ICD-10-CM | POA: Diagnosis not present

## 2023-08-25 DIAGNOSIS — D631 Anemia in chronic kidney disease: Secondary | ICD-10-CM | POA: Diagnosis not present

## 2023-08-25 DIAGNOSIS — N1831 Chronic kidney disease, stage 3a: Secondary | ICD-10-CM | POA: Diagnosis not present

## 2023-08-25 DIAGNOSIS — E1129 Type 2 diabetes mellitus with other diabetic kidney complication: Secondary | ICD-10-CM | POA: Diagnosis not present

## 2023-08-25 DIAGNOSIS — E875 Hyperkalemia: Secondary | ICD-10-CM | POA: Diagnosis not present

## 2023-08-25 DIAGNOSIS — R809 Proteinuria, unspecified: Secondary | ICD-10-CM | POA: Diagnosis not present

## 2023-08-31 ENCOUNTER — Other Ambulatory Visit: Payer: Self-pay

## 2023-08-31 ENCOUNTER — Encounter: Payer: Self-pay | Admitting: Internal Medicine

## 2023-08-31 ENCOUNTER — Other Ambulatory Visit

## 2023-08-31 ENCOUNTER — Ambulatory Visit: Payer: Self-pay | Admitting: Internal Medicine

## 2023-08-31 ENCOUNTER — Ambulatory Visit (INDEPENDENT_AMBULATORY_CARE_PROVIDER_SITE_OTHER): Admitting: Internal Medicine

## 2023-08-31 VITALS — BP 118/60 | HR 59 | Ht 71.0 in | Wt 177.0 lb

## 2023-08-31 DIAGNOSIS — I10 Essential (primary) hypertension: Secondary | ICD-10-CM | POA: Diagnosis not present

## 2023-08-31 DIAGNOSIS — I152 Hypertension secondary to endocrine disorders: Secondary | ICD-10-CM | POA: Diagnosis not present

## 2023-08-31 DIAGNOSIS — E1159 Type 2 diabetes mellitus with other circulatory complications: Secondary | ICD-10-CM

## 2023-08-31 DIAGNOSIS — E782 Mixed hyperlipidemia: Secondary | ICD-10-CM

## 2023-08-31 DIAGNOSIS — E1165 Type 2 diabetes mellitus with hyperglycemia: Secondary | ICD-10-CM

## 2023-08-31 DIAGNOSIS — E1169 Type 2 diabetes mellitus with other specified complication: Secondary | ICD-10-CM | POA: Diagnosis not present

## 2023-08-31 DIAGNOSIS — I25119 Atherosclerotic heart disease of native coronary artery with unspecified angina pectoris: Secondary | ICD-10-CM | POA: Diagnosis not present

## 2023-08-31 DIAGNOSIS — N189 Chronic kidney disease, unspecified: Secondary | ICD-10-CM | POA: Diagnosis not present

## 2023-08-31 DIAGNOSIS — E119 Type 2 diabetes mellitus without complications: Secondary | ICD-10-CM

## 2023-08-31 DIAGNOSIS — E039 Hypothyroidism, unspecified: Secondary | ICD-10-CM | POA: Diagnosis not present

## 2023-08-31 LAB — POCT CBG (FASTING - GLUCOSE)-MANUAL ENTRY: Glucose Fasting, POC: 137 mg/dL — AB (ref 70–99)

## 2023-08-31 NOTE — Progress Notes (Signed)
 Established Patient Office Visit  Subjective:  Patient ID: Erik Collins, male    DOB: 1945-05-02  Age: 78 y.o. MRN: [id number]  Chief Complaint  Patient presents with   Follow-up    4 month follow up    Patient comes in for his follow-up today.  He is accompanied by his wife.  He had his labs drawn yesterday, results pending.  He generally feels well and has no new complaints other than his chronic bilateral knee pain.  Patient has lost some weight as he is using smaller portion sizes and choosing low-carb, low-fat food.  Denies nausea or vomiting, no abdominal pain, no diarrhea and no constipation.    No other concerns at this time.   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   R nose lateral supratip - referred for radiation oncology   Cancer [facility])    hx lung cancer   CHF (congestive heart failure) (HCC)    Complication of anesthesia    knee surgery caused arrythmia requiring a temporary pacemaker   COPD (chronic obstructive pulmonary disease) (HCC)    Coronary artery disease    s/p CABG   Diabetes mellitus without complication (HCC)    Diverticulosis    Duodenitis    Dysrhythmia    during total knee replacement patient required a temporary pacemaker   GERD (gastroesophageal reflux disease)    Hypercholesterolemia    Hyperplastic colon polyp    Hypertension    Internal hemorrhoids    Lung cancer (HCC)    Lung cancer (HCC)    Myocardial infarction (HCC)    09/2009 f/up with Dr. Denyse Bathe   Sleep apnea     Past Surgical History:  Procedure Laterality Date   BACK SURGERY     CATARACT EXTRACTION W/PHACO Left 11/09/2019   Procedure: CATARACT EXTRACTION PHACO AND INTRAOCULAR LENS PLACEMENT (IOC) LEFT DIABETIC MALYUGIN 5.91 01:01.0 9.6%;  Surgeon: Mittie Gaskin, MD;  Location: [facility] SURGERY [facility]TR;  Service: Ophthalmology;  Laterality: Left;   CATARACT EXTRACTION W/PHACO Right 12/07/2019    Procedure: CATARACT EXTRACTION PHACO AND INTRAOCULAR LENS PLACEMENT (IOC) RIGHT DIABETIC MALYUGIN 6.38 01:05.1 9.8%;  Surgeon: Mittie Gaskin, MD;  Location: [facility] SUR[facility]ERY [facility];  Service: Ophthalmology;  Laterality: Right;   COLONOSCOPY N/A 11/23/2019   Procedure: COLONOSCOPY;  Surgeon: Toledo, Ladell POUR, MD;  Location: [facility] ENDOSCOPY;  Service: Gastroenterology;  Laterality: N/A;   COLONOSCOPY WITH PROPOFOL  N/A 11/09/2014   Procedure: COLONOSCOPY WITH PROPOFOL ;  Surgeon: Donnice Vaughn Manes, MD;  Location: [facility] ENDOSCOPY;  Service: Endoscopy;  Laterality: N/A;   CORONARY ANGIOPLASTY     CORONARY ARTERY BYPASS GRAFT  09/2009   quadruple, done at [facility]   CORONARY ARTERY BYPASS GRAFT     ESOPHAGOGASTRODUODENOSCOPY (EGD) WITH PROPOFOL  N/A 11/09/2014   Procedure: ESOPHAGOGASTRODUODENOSCOPY (EGD) WITH PROPOFOL ;  Surgeon: Donnice Vaughn Manes, MD;  Location: [facility] ENDOSCOPY;  Service: Endoscopy;  Laterality: N/A;   left lower lobectomy  09/2009   at [facility] LAMINECTOMY/DECOMPRESSION MICRODISCECTOMY  12/17/2011   Procedure: LUMBAR LAMINECTOMY/DECOMPRESSION MICRODISCECTOMY 1 LEVEL;  Surgeon: Alm GORMAN Molt, MD;  Location: [facility] NEURO ORS;  Service: Neurosurgery;  Laterality: Right;  Right Lumbar five-sacral one microdiscectomy   TEMPORARY PACEMAKER Right 07/10/2017   Procedure: TEMPORARY PACEMAKER;  Surgeon: Ammon Blunt, MD;  Location: [facility] INVASIVE CV LAB;  Service: Cardiovascular;  Laterality: Right;   TOTAL KNEE ARTHROPLASTY Left 07/08/2017   Procedure: TOTAL KNEE ARTHROPLASTY;  Surgeon:  Cleotilde Barrio, MD;  Location: [facility] ORS;  Service: Orthopedics;  Laterality: Left;    Social History   Socioeconomic History   Marital status: Married    Spouse name: Not on file   Number of children: Not on file   Years of education: Not on file   Highest education level: Not on file  Occupational History   Not on file  Tobacco Use   Smoking status: Former    Current packs/day: 0.00    Types:  Cigarettes    Quit date: 09/19/2009    Years since quitting: 13.9   Smokeless tobacco: Never  Vaping Use   Vaping status: Never Used  Substance and Sexual Activity   Alcohol use: No   Drug use: No   Sexual activity: Not on file  Other Topics Concern   Not on file  Social History Narrative   Not on file   Social Drivers of Health   Financial Resource Strain: Patient Declined (04/13/2023)   Received from [facility]   Overall Financial Resource Strain (CARDIA)    Difficulty of Paying Living Expenses: Patient declined  Food Insecurity: Patient Declined (04/13/2023)   Received from [facility]   Hunger Vital Sign    Within the past 12 months, you worried that your food would run out before you got the money to buy more.: Patient declined    Within the past 12 months, the food you bought just didn't last and you didn't have money to get more.: Patient declined  Transportation Needs: Patient Declined (04/13/2023)   Received from [facility] - Transportation    In the past 12 months, has lack of transportation kept you from medical appointments or from getting medications?: Patient declined    Lack of Transportation (Non-Medical): Patient declined  Physical Activity: Not on file  Stress: Not on file  Social Connections: Not on file  Intimate Partner Violence: Not on file    Family History  Problem Relation Age of Onset   Heart disease Mother    Diabetes Mother    Heart disease Father    Stroke Father    Heart attack Father     Allergies  Allergen Reactions   Spironolactone    Statins     Outpatient Medications Prior to Visit  Medication Sig   acetaminophen  (TYLENOL ) 650 MG CR tablet Take 1,300 mg by mouth 2 (two) times daily.   aspirin  EC 81 MG tablet Take 81 mg by mouth daily.   carvedilol  (COREG ) 12.5 MG tablet TAKE 1 TABLET BY MOUTH 2 TIMES DAILY WITH A MEAL   chlorthalidone (HYGROTON) 25 MG tablet Take 25  mg by mouth daily.   empagliflozin  (JARDIANCE ) 25 MG TABS tablet Take 25 mg by mouth daily.   ezetimibe  (ZETIA ) 10 MG tablet TAKE 1 TABLET BY MOUTH DAILY   gabapentin  (NEURONTIN ) 300 MG capsule Take 300 mg by mouth daily.   gabapentin  (NEURONTIN ) 600 MG tablet TAKE 1 TABLET BY MOUTH TWICE A DAY   insulin  glargine, 2 Unit Dial, (TOUJEO  MAX SOLOSTAR) 300 UNIT/ML Solostar Pen Inject 40 Units into the skin at bedtime.   losartan -hydrochlorothiazide (HYZAAR) 50-12.5 MG tablet Take 1 tablet by mouth daily.   Magnesium  400 MG CAPS Take by mouth.   metFORMIN  (GLUCOPHAGE ) 1000 MG tablet TAKE 1 TABLET TWICE DAILY   NEXLETOL  180 MG TABS TAKE 1 TABLET BY MOUTH DAILY   olmesartan (BENICAR) 5 MG tablet Take 5 mg by mouth  daily.   Omega-3 Fatty Acids (FISH OIL) 1200 MG CAPS Take 1,200 mg by mouth 2 (two) times daily.   omeprazole  (PRILOSEC) 20 MG capsule TAKE 1 CAPSULE EVERY DAY   vitamin B-12 (CYANOCOBALAMIN ) 500 MCG tablet Take 500 mcg by mouth daily.   olmesartan (BENICAR) 20 MG tablet Take 20 mg by mouth daily. (Patient not taking: Reported on 08/31/2023)   No facility-administered medications prior to visit.    Review of Systems  Constitutional: Negative.  Negative for chills, diaphoresis, fever and malaise/fatigue.  HENT: Negative.  Negative for congestion and sore throat.   Eyes: Negative.   Respiratory: Negative.  Negative for cough and shortness of breath.   Cardiovascular: Negative.  Negative for chest pain, palpitations and leg swelling.  Gastrointestinal: Negative.  Negative for abdominal pain, constipation, diarrhea, heartburn, nausea and vomiting.  Genitourinary: Negative.  Negative for dysuria and flank pain.  Musculoskeletal: Negative.  Negative for joint pain and myalgias.  Skin: Negative.   Neurological: Negative.  Negative for dizziness, tingling, tremors, sensory change and headaches.  Endo/Heme/Allergies: Negative.   Psychiatric/Behavioral: Negative.  Negative for depression and  suicidal ideas. The patient is not nervous/anxious.        Objective:   BP 118/60   Pulse (!) 59   Ht 5' 11 (1.803 m)   Wt 177 lb (80.3 kg)   SpO2 96%   BMI 24.69 kg/m   Vitals:   08/31/23 0906  BP: 118/60  Pulse: (!) 59  Height: 5' 11 (1.803 m)  Weight: 177 lb (80.3 kg)  SpO2: 96%  BMI (Calculated): 24.7    Physical Exam Vitals and nursing note reviewed.  Constitutional:      Appearance: Normal appearance.  HENT:     Head: Normocephalic and atraumatic.     Nose: Nose normal.     Mouth/Throat:     Mouth: Mucous membranes are moist.     Pharynx: Oropharynx is clear.  Eyes:     Conjunctiva/sclera: Conjunctivae normal.     Pupils: Pupils are equal, round, and reactive to light.  Cardiovascular:     Rate and Rhythm: Normal rate and regular rhythm.     Pulses: Normal pulses.     Heart sounds: Normal heart sounds.  Pulmonary:     Effort: Pulmonary effort is normal.     Breath sounds: Normal breath sounds.  Abdominal:     General: Bowel sounds are normal.     Palpations: Abdomen is soft.  Musculoskeletal:        General: Normal range of motion.     Cervical back: Normal range of motion.  Skin:    General: Skin is warm and dry.  Neurological:     General: No focal deficit present.     Mental Status: He is alert and oriented to person, place, and time.  Psychiatric:        Mood and Affect: Mood normal.        Behavior: Behavior normal.        Judgment: Judgment normal.      Results for orders placed or performed in visit on 08/31/23  POCT CBG (Fasting - Glucose)  Result Value Ref Range   Glucose Fasting, POC 137 (A) 70 - 99 mg/dL    Recent Results (from the past 2160 hours)  POCT CBG (Fasting - Glucose)     Status: Abnormal   Collection Time: 08/31/23  9:12 AM  Result Value Ref Range   Glucose Fasting, POC 137 (A) 70 -  99 mg/dL      Assessment & Plan:  Continue current medications.  Will call with results once available. Problem List Items  Addressed This Visit     Hypertension associated with diabetes (HCC) - Primary   Relevant Medications   olmesartan (BENICAR) 5 MG tablet   Coronary disease   Relevant Medications   olmesartan (BENICAR) 5 MG tablet   Diabetes mellitus type 2, uncomplicated (HCC)   Relevant Medications   olmesartan (BENICAR) 5 MG tablet   Other Relevant Orders   POCT CBG (Fasting - Glucose) (Completed)   Chronic kidney disease   Combined hyperlipidemia associated with type 2 diabetes mellitus (HCC)   Relevant Medications   olmesartan (BENICAR) 5 MG tablet    Return in about 4 months (around 01/01/2024).   Total time spent: 30 minutes  [name], MD  08/31/2023   This document may have been prepared by [facility] Voice Recognition software and as such may include unintentional dictation errors.

## 2023-09-01 ENCOUNTER — Other Ambulatory Visit: Payer: Self-pay | Admitting: Internal Medicine

## 2023-09-01 ENCOUNTER — Other Ambulatory Visit

## 2023-09-01 DIAGNOSIS — E875 Hyperkalemia: Secondary | ICD-10-CM

## 2023-09-03 ENCOUNTER — Ambulatory Visit: Payer: Self-pay | Admitting: Internal Medicine

## 2023-09-03 LAB — POTASSIUM: Potassium: 5.8 mmol/L — ABNORMAL HIGH (ref 3.5–5.2)

## 2023-09-03 NOTE — Progress Notes (Signed)
 Patient notified

## 2023-09-04 ENCOUNTER — Ambulatory Visit: Payer: Self-pay | Admitting: Internal Medicine

## 2023-09-04 LAB — CBC WITH DIFFERENTIAL/PLATELET
Basophils Absolute: 0 x10E3/uL (ref 0.0–0.2)
Basos: 0 %
EOS (ABSOLUTE): 0.1 x10E3/uL (ref 0.0–0.4)
Eos: 1 %
Hematocrit: 35.3 % — ABNORMAL LOW (ref 37.5–51.0)
Hemoglobin: 11 g/dL — ABNORMAL LOW (ref 13.0–17.7)
Immature Grans (Abs): 0.1 x10E3/uL (ref 0.0–0.1)
Immature Granulocytes: 1 %
Lymphocytes Absolute: 1.2 x10E3/uL (ref 0.7–3.1)
Lymphs: 13 %
MCH: 29.9 pg (ref 26.6–33.0)
MCHC: 31.2 g/dL — ABNORMAL LOW (ref 31.5–35.7)
MCV: 96 fL (ref 79–97)
Monocytes Absolute: 0.7 x10E3/uL (ref 0.1–0.9)
Monocytes: 8 %
Neutrophils Absolute: 7 x10E3/uL (ref 1.4–7.0)
Neutrophils: 77 %
Platelets: 295 x10E3/uL (ref 150–450)
RBC: 3.68 x10E6/uL — ABNORMAL LOW (ref 4.14–5.80)
RDW: 13.1 % (ref 11.6–15.4)
WBC: 9 x10E3/uL (ref 3.4–10.8)

## 2023-09-04 LAB — TSH: TSH: 4.55 u[IU]/mL — ABNORMAL HIGH (ref 0.450–4.500)

## 2023-09-04 LAB — CMP14+EGFR
ALT: 10 IU/L (ref 0–44)
AST: 15 IU/L (ref 0–40)
Albumin: 4.5 g/dL (ref 3.8–4.8)
Alkaline Phosphatase: 51 IU/L (ref 44–121)
BUN/Creatinine Ratio: 24 (ref 10–24)
BUN: 53 mg/dL — ABNORMAL HIGH (ref 8–27)
Bilirubin Total: 0.2 mg/dL (ref 0.0–1.2)
CO2: 15 mmol/L — ABNORMAL LOW (ref 20–29)
Calcium: 9.5 mg/dL (ref 8.6–10.2)
Chloride: 108 mmol/L — ABNORMAL HIGH (ref 96–106)
Creatinine, Ser: 2.22 mg/dL — ABNORMAL HIGH (ref 0.76–1.27)
Globulin, Total: 2.6 g/dL (ref 1.5–4.5)
Glucose: 122 mg/dL — ABNORMAL HIGH (ref 70–99)
Sodium: 140 mmol/L (ref 134–144)
Total Protein: 7.1 g/dL (ref 6.0–8.5)
eGFR: 29 mL/min/1.73 — ABNORMAL LOW (ref >59)

## 2023-09-04 LAB — LIPID PANEL
Chol/HDL Ratio: 3 ratio (ref 0.0–5.0)
Cholesterol, Total: 95 mg/dL — ABNORMAL LOW (ref 100–199)
HDL: 32 mg/dL — ABNORMAL LOW (ref >39)
LDL Chol Calc (NIH): 48 mg/dL (ref 0–99)
Triglycerides: 68 mg/dL (ref 0–149)
VLDL Cholesterol Cal: 15 mg/dL (ref 5–40)

## 2023-09-04 LAB — HEMOGLOBIN A1C
Est. average glucose Bld gHb Est-mCnc: 166 mg/dL
Hgb A1c MFr Bld: 7.4 % — ABNORMAL HIGH (ref 4.8–5.6)

## 2023-09-08 DIAGNOSIS — E875 Hyperkalemia: Secondary | ICD-10-CM | POA: Diagnosis not present

## 2023-09-08 DIAGNOSIS — N1831 Chronic kidney disease, stage 3a: Secondary | ICD-10-CM | POA: Diagnosis not present

## 2023-09-08 DIAGNOSIS — I10 Essential (primary) hypertension: Secondary | ICD-10-CM | POA: Diagnosis not present

## 2023-09-08 DIAGNOSIS — E1129 Type 2 diabetes mellitus with other diabetic kidney complication: Secondary | ICD-10-CM | POA: Diagnosis not present

## 2023-09-08 DIAGNOSIS — R809 Proteinuria, unspecified: Secondary | ICD-10-CM | POA: Diagnosis not present

## 2023-09-08 DIAGNOSIS — E785 Hyperlipidemia, unspecified: Secondary | ICD-10-CM | POA: Diagnosis not present

## 2023-09-08 DIAGNOSIS — D631 Anemia in chronic kidney disease: Secondary | ICD-10-CM | POA: Diagnosis not present

## 2023-09-10 DIAGNOSIS — M25561 Pain in right knee: Secondary | ICD-10-CM | POA: Diagnosis not present

## 2023-09-10 DIAGNOSIS — R262 Difficulty in walking, not elsewhere classified: Secondary | ICD-10-CM | POA: Diagnosis not present

## 2023-09-10 DIAGNOSIS — M25661 Stiffness of right knee, not elsewhere classified: Secondary | ICD-10-CM | POA: Diagnosis not present

## 2023-09-10 DIAGNOSIS — M1711 Unilateral primary osteoarthritis, right knee: Secondary | ICD-10-CM | POA: Diagnosis not present

## 2023-09-14 ENCOUNTER — Ambulatory Visit (INDEPENDENT_AMBULATORY_CARE_PROVIDER_SITE_OTHER): Admitting: Cardiovascular Disease

## 2023-09-14 ENCOUNTER — Other Ambulatory Visit: Payer: Self-pay

## 2023-09-14 ENCOUNTER — Encounter: Payer: Self-pay | Admitting: Cardiovascular Disease

## 2023-09-14 VITALS — BP 116/60 | HR 79 | Ht 71.0 in | Wt 178.8 lb

## 2023-09-14 DIAGNOSIS — I25119 Atherosclerotic heart disease of native coronary artery with unspecified angina pectoris: Secondary | ICD-10-CM | POA: Diagnosis not present

## 2023-09-14 DIAGNOSIS — I152 Hypertension secondary to endocrine disorders: Secondary | ICD-10-CM | POA: Diagnosis not present

## 2023-09-14 DIAGNOSIS — I6523 Occlusion and stenosis of bilateral carotid arteries: Secondary | ICD-10-CM | POA: Diagnosis not present

## 2023-09-14 DIAGNOSIS — I50812 Chronic right heart failure: Secondary | ICD-10-CM

## 2023-09-14 DIAGNOSIS — E782 Mixed hyperlipidemia: Secondary | ICD-10-CM | POA: Diagnosis not present

## 2023-09-14 DIAGNOSIS — E1159 Type 2 diabetes mellitus with other circulatory complications: Secondary | ICD-10-CM | POA: Diagnosis not present

## 2023-09-14 MED ORDER — GABAPENTIN 300 MG PO CAPS: ORAL_CAPSULE | ORAL | 1 refills | Status: DC

## 2023-09-14 NOTE — Progress Notes (Signed)
 Cardiology Office Note   Date:  09/14/2023   ID:  Erik, Collins Jun 05, 1945, MRN [id number]  PCP:  [name], MD  Cardiologist:  [name], MD      History of Present Illness: Erik Collins is a 78 y.o. male who presents for  Chief Complaint  Patient presents with   Follow-up    3 month follow up    Has DOE occasionally.      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   R nose lateral supratip - referred for radiation oncology   Cancer [facility])    hx lung cancer   CHF (congestive heart failure) (HCC)    Complication of anesthesia    knee surgery caused arrythmia requiring a temporary pacemaker   COPD (chronic obstructive pulmonary disease) (HCC)    Coronary artery disease    s/p CABG   Diabetes mellitus without complication (HCC)    Diverticulosis    Duodenitis    Dysrhythmia    during total knee replacement patient required a temporary pacemaker   GERD (gastroesophageal reflux disease)    Hypercholesterolemia    Hyperplastic colon polyp    Hypertension    Internal hemorrhoids    Lung cancer (HCC)    Lung cancer (HCC)    Myocardial infarction (HCC)    09/2009 f/up with Dr. [name]   Sleep apnea      Past Surgical History:  Procedure Laterality Date   BACK SURGERY     CATARACT EXTRACTION W/PHACO Left 11/09/2019   Procedure: CATARACT EXTRACTION PHACO AND INTRAOCULAR LENS PLACEMENT (IOC) LEFT DIABETIC MALYUGIN 5.91 01:01.0 9.6%;  Surgeon: Mittie Gaskin, MD;  Location: [facility] SURGERY CNTR;  Service: Ophthalmology;  Laterality: Left;   CATARACT EXTRACTION W/PHACO Right 12/07/2019   Procedure: CATARACT EXTRACTION PHACO AND INTRAOCULAR LENS PLACEMENT (IOC) RIGHT DIABETIC MALYUGIN 6.38 01:05.1 9.8%;  Surgeon: Mittie Gaskin, MD;  Location: [facility] SURGERY CNTR;  Service: Ophthalmology;  Laterality: Right;   COLONOSCOPY N/A 11/23/2019   Procedure: COLONOSCOPY;  Surgeon: Toledo,  Ladell POUR, MD;  Location: [facility] ENDOSCOPY;  Service: Gastroenterology;  Laterality: N/A;   COLONOSCOPY WITH PROPOFOL  N/A 11/09/2014   Procedure: COLONOSCOPY WITH PROPOFOL ;  Surgeon: Donnice Vaughn Manes, MD;  Location: [facility] ENDOSCOPY;  Service: Endoscopy;  Laterality: N/A;   CORONARY ANGIOPLASTY     CORONARY ARTERY BYPASS GRAFT  09/2009   quadruple, done at [facility]   CORONARY ARTERY BYPASS GRAFT     ESOPHAGOGASTRODUODENOSCOPY (EGD) WITH PROPOFOL  N/A 11/09/2014   Procedure: ESOPHAGOGASTRODUODENOSCOPY (EGD) WITH PROPOFOL ;  Surgeon: Donnice Vaughn Manes, MD;  Location: [facility] ENDOSCOPY;  Service: Endoscopy;  Laterality: N/A;   left lower lobectomy  09/2009   at [facility] LAMINECTOMY/DECOMPRESSION MICRODISCECTOMY  12/17/2011   Procedure: LUMBAR LAMINECTOMY/DECOMPRESSION MICRODISCECTOMY 1 LEVEL;  Surgeon: Alm RAMAN Molt, MD;  Location: [facility] NEURO ORS;  Service: Neurosurgery;  Laterality: Right;  Right Lumbar five-sacral one microdiscectomy   TEMPORARY PACEMAKER Right 07/10/2017   Procedure: TEMPORARY PACEMAKER;  Surgeon: Ammon Blunt, MD;  Location: [facility] INVASIVE CV LAB;  Service: Cardiovascular;  Laterality: Right;   TOTAL KNEE ARTHROPLASTY Left 07/08/2017   Procedure: TOTAL KNEE ARTHROPLASTY;  Surgeon: Cleotilde Barrio, MD;  Location: [facility] ORS;  Service: Orthopedics;  Laterality: Left;     Current Outpatient Medications  Medication Sig Dispense Refill   acetaminophen  (TYLENOL ) 650 MG CR tablet Take 1,300 mg by mouth 2 (two) times daily.  aspirin  EC 81 MG tablet Take 81 mg by mouth daily.     carvedilol  (COREG ) 12.5 MG tablet TAKE 1 TABLET BY MOUTH 2 TIMES DAILY WITH A MEAL 180 tablet 3   chlorthalidone (HYGROTON) 25 MG tablet Take 25 mg by mouth daily.     empagliflozin  (JARDIANCE ) 25 MG TABS tablet Take 25 mg by mouth daily.     ezetimibe  (ZETIA ) 10 MG tablet TAKE 1 TABLET BY MOUTH DAILY 90 tablet 1   gabapentin  (NEURONTIN ) 300 MG capsule Take 300 mg by mouth daily.     gabapentin  (NEURONTIN )  600 MG tablet TAKE 1 TABLET BY MOUTH TWICE A DAY 180 tablet 3   insulin  glargine, 2 Unit Dial, (TOUJEO  MAX SOLOSTAR) 300 UNIT/ML Solostar Pen Inject 40 Units into the skin at bedtime. 90 mL 1   Magnesium  400 MG CAPS Take by mouth.     metFORMIN  (GLUCOPHAGE ) 1000 MG tablet TAKE 1 TABLET TWICE DAILY 180 tablet 3   NEXLETOL  180 MG TABS TAKE 1 TABLET BY MOUTH DAILY 90 tablet 3   Omega-3 Fatty Acids (FISH OIL) 1200 MG CAPS Take 1,200 mg by mouth 2 (two) times daily.     omeprazole  (PRILOSEC) 20 MG capsule TAKE 1 CAPSULE EVERY DAY 90 capsule 3   vitamin B-12 (CYANOCOBALAMIN ) 500 MCG tablet Take 500 mcg by mouth daily.     No current facility-administered medications for this visit.    Allergies:   Spironolactone and Statins    Social History:   reports that he quit smoking about 13 years ago. His smoking use included cigarettes. He has never used smokeless tobacco. He reports that he does not drink alcohol and does not use drugs.   Family History:  family history includes Diabetes in his mother; Heart attack in his father; Heart disease in his father and mother; Stroke in his father.    ROS:     Review of Systems  Constitutional: Negative.   HENT: Negative.    Eyes: Negative.   Respiratory: Negative.    Gastrointestinal: Negative.   Genitourinary: Negative.   Musculoskeletal: Negative.   Skin: Negative.   Neurological: Negative.   Endo/Heme/Allergies: Negative.   Psychiatric/Behavioral: Negative.    All other systems reviewed and are negative.     All other systems are reviewed and negative.    PHYSICAL EXAM: VS:  BP 116/60   Pulse 79   Ht 5' 11 (1.803 m)   Wt 178 lb 12.8 oz (81.1 kg)   SpO2 95%   BMI 24.94 kg/m  , BMI Body mass index is 24.94 kg/m. Last weight:  Wt Readings from Last 3 Encounters:  09/14/23 178 lb 12.8 oz (81.1 kg)  08/31/23 177 lb (80.3 kg)  06/15/23 180 lb 12.8 oz (82 kg)     Physical Exam Vitals reviewed.  Constitutional:      Appearance:  Normal appearance. He is normal weight.  HENT:     Head: Normocephalic.     Nose: Nose normal.     Mouth/Throat:     Mouth: Mucous membranes are moist.  Eyes:     Pupils: Pupils are equal, round, and reactive to light.  Cardiovascular:     Rate and Rhythm: Normal rate and regular rhythm.     Pulses: Normal pulses.     Heart sounds: Normal heart sounds.  Pulmonary:     Effort: Pulmonary effort is normal.  Abdominal:     General: Abdomen is flat. Bowel sounds are normal.  Musculoskeletal:  General: Normal range of motion.     Cervical back: Normal range of motion.  Skin:    General: Skin is warm.  Neurological:     General: No focal deficit present.     Mental Status: He is alert.  Psychiatric:        Mood and Affect: Mood normal.       EKG:   Recent Labs: 08/31/2023: ALT 10; BUN 53; Creatinine, Ser 2.22; Hemoglobin 11.0; Platelets 295; Sodium 140; TSH 4.550 09/01/2023: Potassium 5.8    Lipid Panel    Component Value Date/Time   CHOL 95 (L) 08/31/2023 0903   TRIG 68 08/31/2023 0903   HDL 32 (L) 08/31/2023 0903   CHOLHDL 3.0 08/31/2023 0903   LDLCALC 48 08/31/2023 0903      Other studies Reviewed: Additional studies/ records that were reviewed today include:  Review of the above records demonstrates:       No data to display            ASSESSMENT AND PLAN:    ICD-10-CM   1. Hypertension associated with diabetes (HCC)  E11.59    I15.2    BP was low and creat over 2, this losartan  dicontinued.    2. Bilateral carotid artery stenosis  I65.23     3. Chronic right-sided congestive heart failure (HCC)  I50.812     4. Coronary artery disease involving native coronary artery of native heart with angina pectoris (HCC)  I25.119     5. Mixed hyperlipidemia  E78.2        Problem List Items Addressed This Visit       Cardiovascular and Mediastinum   Carotid stenosis   Hypertension associated with diabetes (HCC) - Primary   Congestive heart  failure (HCC)   Coronary disease     Other   Hyperlipidemia       Disposition:   Return in about 3 months (around 12/15/2023).    Total time spent: 35 minutes  Signed,  Denyse Bathe, MD  09/14/2023 9:22 AM    [facility]

## 2023-09-17 DIAGNOSIS — R262 Difficulty in walking, not elsewhere classified: Secondary | ICD-10-CM | POA: Diagnosis not present

## 2023-09-17 DIAGNOSIS — M25561 Pain in right knee: Secondary | ICD-10-CM | POA: Diagnosis not present

## 2023-09-17 DIAGNOSIS — M1711 Unilateral primary osteoarthritis, right knee: Secondary | ICD-10-CM | POA: Diagnosis not present

## 2023-09-17 DIAGNOSIS — M25661 Stiffness of right knee, not elsewhere classified: Secondary | ICD-10-CM | POA: Diagnosis not present

## 2023-09-22 DIAGNOSIS — D631 Anemia in chronic kidney disease: Secondary | ICD-10-CM | POA: Diagnosis not present

## 2023-09-22 DIAGNOSIS — N1831 Chronic kidney disease, stage 3a: Secondary | ICD-10-CM | POA: Diagnosis not present

## 2023-09-22 DIAGNOSIS — E785 Hyperlipidemia, unspecified: Secondary | ICD-10-CM | POA: Diagnosis not present

## 2023-09-22 DIAGNOSIS — E1129 Type 2 diabetes mellitus with other diabetic kidney complication: Secondary | ICD-10-CM | POA: Diagnosis not present

## 2023-09-22 DIAGNOSIS — E875 Hyperkalemia: Secondary | ICD-10-CM | POA: Diagnosis not present

## 2023-09-22 DIAGNOSIS — R809 Proteinuria, unspecified: Secondary | ICD-10-CM | POA: Diagnosis not present

## 2023-09-22 DIAGNOSIS — I10 Essential (primary) hypertension: Secondary | ICD-10-CM | POA: Diagnosis not present

## 2023-09-24 DIAGNOSIS — M1711 Unilateral primary osteoarthritis, right knee: Secondary | ICD-10-CM | POA: Diagnosis not present

## 2023-09-24 DIAGNOSIS — M25561 Pain in right knee: Secondary | ICD-10-CM | POA: Diagnosis not present

## 2023-09-24 DIAGNOSIS — M25661 Stiffness of right knee, not elsewhere classified: Secondary | ICD-10-CM | POA: Diagnosis not present

## 2023-09-24 DIAGNOSIS — R262 Difficulty in walking, not elsewhere classified: Secondary | ICD-10-CM | POA: Diagnosis not present

## 2023-10-01 DIAGNOSIS — M25661 Stiffness of right knee, not elsewhere classified: Secondary | ICD-10-CM | POA: Diagnosis not present

## 2023-10-01 DIAGNOSIS — R262 Difficulty in walking, not elsewhere classified: Secondary | ICD-10-CM | POA: Diagnosis not present

## 2023-10-01 DIAGNOSIS — M25561 Pain in right knee: Secondary | ICD-10-CM | POA: Diagnosis not present

## 2023-10-01 DIAGNOSIS — M1711 Unilateral primary osteoarthritis, right knee: Secondary | ICD-10-CM | POA: Diagnosis not present

## 2023-10-08 DIAGNOSIS — M1711 Unilateral primary osteoarthritis, right knee: Secondary | ICD-10-CM | POA: Diagnosis not present

## 2023-10-08 DIAGNOSIS — M25661 Stiffness of right knee, not elsewhere classified: Secondary | ICD-10-CM | POA: Diagnosis not present

## 2023-10-08 DIAGNOSIS — R262 Difficulty in walking, not elsewhere classified: Secondary | ICD-10-CM | POA: Diagnosis not present

## 2023-10-08 DIAGNOSIS — M25561 Pain in right knee: Secondary | ICD-10-CM | POA: Diagnosis not present

## 2023-10-15 ENCOUNTER — Other Ambulatory Visit: Payer: Self-pay | Admitting: Internal Medicine

## 2023-11-19 DIAGNOSIS — M1711 Unilateral primary osteoarthritis, right knee: Secondary | ICD-10-CM | POA: Diagnosis not present

## 2023-11-19 DIAGNOSIS — M25661 Stiffness of right knee, not elsewhere classified: Secondary | ICD-10-CM | POA: Diagnosis not present

## 2023-11-19 DIAGNOSIS — R262 Difficulty in walking, not elsewhere classified: Secondary | ICD-10-CM | POA: Diagnosis not present

## 2023-11-19 DIAGNOSIS — M25561 Pain in right knee: Secondary | ICD-10-CM | POA: Diagnosis not present

## 2023-12-04 ENCOUNTER — Other Ambulatory Visit: Payer: Self-pay | Admitting: Internal Medicine

## 2023-12-09 ENCOUNTER — Other Ambulatory Visit: Payer: Self-pay | Admitting: Family

## 2023-12-10 ENCOUNTER — Ambulatory Visit: Payer: Medicare Other | Admitting: Radiation Oncology

## 2023-12-11 ENCOUNTER — Ambulatory Visit
Admission: RE | Admit: 2023-12-11 | Discharge: 2023-12-11 | Disposition: A | Discharge status: Home / Self Care | Payer: Medicare Other | Source: Ambulatory Visit | Attending: Radiation Oncology | Admitting: Radiation Oncology

## 2023-12-11 DIAGNOSIS — C3490 Malignant neoplasm of unspecified part of unspecified bronchus or lung: Secondary | ICD-10-CM | POA: Diagnosis not present

## 2023-12-11 DIAGNOSIS — J8410 Pulmonary fibrosis, unspecified: Secondary | ICD-10-CM | POA: Diagnosis not present

## 2023-12-11 DIAGNOSIS — I700 Atherosclerosis of aorta: Secondary | ICD-10-CM | POA: Diagnosis not present

## 2023-12-11 DIAGNOSIS — R911 Solitary pulmonary nodule: Secondary | ICD-10-CM | POA: Diagnosis not present

## 2023-12-11 LAB — POCT I-STAT CREATININE: Creatinine, Ser: 1.4 mg/dL — ABNORMAL HIGH (ref 0.61–1.24)

## 2023-12-11 MED ORDER — IOHEXOL 300 MG/ML  SOLN
65.0000 mL | Freq: Once | INTRAMUSCULAR | Status: AC | PRN
Start: 2023-12-11 — End: 2023-12-11
  Administered 2023-12-11: 65 mL via INTRAVENOUS

## 2023-12-14 ENCOUNTER — Other Ambulatory Visit: Payer: Self-pay | Admitting: Internal Medicine

## 2023-12-14 DIAGNOSIS — D631 Anemia in chronic kidney disease: Secondary | ICD-10-CM | POA: Diagnosis not present

## 2023-12-14 DIAGNOSIS — E1129 Type 2 diabetes mellitus with other diabetic kidney complication: Secondary | ICD-10-CM | POA: Diagnosis not present

## 2023-12-14 DIAGNOSIS — E875 Hyperkalemia: Secondary | ICD-10-CM | POA: Diagnosis not present

## 2023-12-14 DIAGNOSIS — N1831 Chronic kidney disease, stage 3a: Secondary | ICD-10-CM | POA: Diagnosis not present

## 2023-12-14 DIAGNOSIS — I10 Essential (primary) hypertension: Secondary | ICD-10-CM | POA: Diagnosis not present

## 2023-12-14 DIAGNOSIS — R809 Proteinuria, unspecified: Secondary | ICD-10-CM | POA: Diagnosis not present

## 2023-12-14 DIAGNOSIS — E785 Hyperlipidemia, unspecified: Secondary | ICD-10-CM | POA: Diagnosis not present

## 2023-12-15 ENCOUNTER — Ambulatory Visit: Admitting: Cardiovascular Disease

## 2023-12-17 ENCOUNTER — Encounter: Payer: Self-pay | Admitting: Cardiovascular Disease

## 2023-12-17 ENCOUNTER — Ambulatory Visit (INDEPENDENT_AMBULATORY_CARE_PROVIDER_SITE_OTHER): Admitting: Cardiovascular Disease

## 2023-12-17 VITALS — BP 136/62 | HR 70 | Ht 71.0 in | Wt 180.8 lb

## 2023-12-17 DIAGNOSIS — E1159 Type 2 diabetes mellitus with other circulatory complications: Secondary | ICD-10-CM | POA: Diagnosis not present

## 2023-12-17 DIAGNOSIS — E1165 Type 2 diabetes mellitus with hyperglycemia: Secondary | ICD-10-CM | POA: Diagnosis not present

## 2023-12-17 DIAGNOSIS — I152 Hypertension secondary to endocrine disorders: Secondary | ICD-10-CM | POA: Diagnosis not present

## 2023-12-17 DIAGNOSIS — R252 Cramp and spasm: Secondary | ICD-10-CM | POA: Diagnosis not present

## 2023-12-17 DIAGNOSIS — I25119 Atherosclerotic heart disease of native coronary artery with unspecified angina pectoris: Secondary | ICD-10-CM | POA: Diagnosis not present

## 2023-12-17 DIAGNOSIS — E782 Mixed hyperlipidemia: Secondary | ICD-10-CM

## 2023-12-17 DIAGNOSIS — I6523 Occlusion and stenosis of bilateral carotid arteries: Secondary | ICD-10-CM | POA: Diagnosis not present

## 2023-12-17 NOTE — Progress Notes (Signed)
 Cardiology Office Note   Date:  12/17/2023   ID:  Erik, Collins Jun 27, 1945, MRN [id number]  PCP:  [name], MD  Cardiologist:  [name], MD      History of Present Illness: Erik Collins is a 78 y.o. male who presents for  Chief Complaint  Patient presents with   Follow-up    3 month follow up    Doing well.      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   R nose lateral supratip - referred for radiation oncology   Cancer [facility])    hx lung cancer   CHF (congestive heart failure) (HCC)    Complication of anesthesia    knee surgery caused arrythmia requiring a temporary pacemaker   COPD (chronic obstructive pulmonary disease) (HCC)    Coronary artery disease    s/p CABG   Diabetes mellitus without complication (HCC)    Diverticulosis    Duodenitis    Dysrhythmia    during total knee replacement patient required a temporary pacemaker   GERD (gastroesophageal reflux disease)    Hypercholesterolemia    Hyperplastic colon polyp    Hypertension    Internal hemorrhoids    Lung cancer (HCC)    Lung cancer (HCC)    Myocardial infarction (HCC)    09/2009 f/up with Dr. [name]   Sleep apnea      Past Surgical History:  Procedure Laterality Date   BACK SURGERY     CATARACT EXTRACTION W/PHACO Left 11/09/2019   Procedure: CATARACT EXTRACTION PHACO AND INTRAOCULAR LENS PLACEMENT (IOC) LEFT DIABETIC MALYUGIN 5.91 01:01.0 9.6%;  Surgeon: Mittie Gaskin, MD;  Location: [facility];  Service: Ophthalmology;  Laterality: Left;   CATARACT EXTRACTION W/PHACO Right 12/07/2019   Procedure: CATARACT EXTRACTION PHACO AND INTRAOCULAR LENS PLACEMENT (IOC) RIGHT DIABETIC MALYUGIN 6.38 01:05.1 9.8%;  Surgeon: Mittie Gaskin, MD;  Location: [facility];  Service: Ophthalmology;  Laterality: Right;   COLONOSCOPY N/A 11/23/2019   Procedure: COLONOSCOPY;  Surgeon: Toledo, Ladell POUR, MD;  Location: [facility] ENDOSCOPY;  Service: Gastroenterology;  Laterality: N/A;   COLONOSCOPY WITH PROPOFOL  N/A 11/09/2014   Procedure: COLONOSCOPY WITH PROPOFOL ;  Surgeon: Donnice Vaughn Manes, MD;  Location: [facility] ENDOSCOPY;  Service: Endoscopy;  Laterality: N/A;   CORONARY ANGIOPLASTY     CORONARY ARTERY BYPASS GRAFT  09/2009   quadruple, done at [facility]   CORONARY ARTERY BYPASS GRAFT     ESOPHAGOGASTRODUODENOSCOPY (EGD) WITH PROPOFOL  N/A 11/09/2014   Procedure: ESOPHAGOGASTRODUODENOSCOPY (EGD) WITH PROPOFOL ;  Surgeon: Donnice Vaughn Manes, MD;  Location: [facility] ENDOSCOPY;  Service: Endoscopy;  Laterality: N/A;   left lower lobectomy  09/2009   at [facility] LAMINECTOMY/DECOMPRESSION MICRODISCECTOMY  12/17/2011   Procedure: LUMBAR LAMINECTOMY/DECOMPRESSION MICRODISCECTOMY 1 LEVEL;  Surgeon: Alm RAMAN Molt, MD;  Location: [facility] NEURO ORS;  Service: Neurosurgery;  Laterality: Right;  Right Lumbar five-sacral one microdiscectomy   TEMPORARY PACEMAKER Right 07/10/2017   Procedure: TEMPORARY PACEMAKER;  Surgeon: Ammon Blunt, MD;  Location: [facility] INVASIVE CV LAB;  Service: Cardiovascular;  Laterality: Right;   TOTAL KNEE ARTHROPLASTY Left 07/08/2017   Procedure: TOTAL KNEE ARTHROPLASTY;  Surgeon: Cleotilde Barrio, MD;  Location: [facility] ORS;  Service: Orthopedics;  Laterality: Left;     Current Outpatient Medications  Medication Sig Dispense Refill   acetaminophen  (TYLENOL ) 650 MG CR tablet Take 1,300 mg by mouth 2 (two) times daily.  aspirin  EC 81 MG tablet Take 81 mg by mouth daily.     carvedilol  (COREG ) 12.5 MG tablet TAKE 1 TABLET BY MOUTH 2 TIMES DAILY WITH A MEAL 180 tablet 3   chlorthalidone (HYGROTON) 25 MG tablet Take 25 mg by mouth daily.     empagliflozin  (JARDIANCE ) 25 MG TABS tablet Take 25 mg by mouth daily.     ezetimibe  (ZETIA ) 10 MG tablet TAKE 1 TABLET BY MOUTH DAILY 90 tablet 1   gabapentin  (NEURONTIN ) 300 MG capsule Take one tablet by mouth at 12pm daily 90 capsule 1    gabapentin  (NEURONTIN ) 600 MG tablet TAKE 1 TABLET BY MOUTH TWICE A DAY 180 tablet 3   glucose blood (CONTOUR NEXT TEST) test strip USE TO TEST SUGARS 3 TIMES DAILY 200 each 6   insulin  glargine, 2 Unit Dial, (TOUJEO  MAX SOLOSTAR) 300 UNIT/ML Solostar Pen Inject 40 Units into the skin at bedtime. 90 mL 1   Magnesium  400 MG CAPS Take by mouth.     metFORMIN  (GLUCOPHAGE ) 1000 MG tablet TAKE 1 TABLET TWICE DAILY 180 tablet 3   NEXLETOL  180 MG TABS TAKE 1 TABLET BY MOUTH DAILY 90 tablet 3   Omega-3 Fatty Acids (FISH OIL) 1200 MG CAPS Take 1,200 mg by mouth 2 (two) times daily.     omeprazole  (PRILOSEC) 20 MG capsule TAKE 1 CAPSULE EVERY DAY 90 capsule 3   vitamin B-12 (CYANOCOBALAMIN ) 500 MCG tablet Take 500 mcg by mouth daily.     No current facility-administered medications for this visit.    Allergies:   Spironolactone and Statins    Social History:   reports that he quit smoking about 14 years ago. His smoking use included cigarettes. He has never used smokeless tobacco. He reports that he does not drink alcohol and does not use drugs.   Family History:  family history includes Diabetes in his mother; Heart attack in his father; Heart disease in his father and mother; Stroke in his father.    ROS:     Review of Systems  Constitutional: Negative.   HENT: Negative.    Eyes: Negative.   Respiratory: Negative.    Gastrointestinal: Negative.   Genitourinary: Negative.   Musculoskeletal: Negative.   Skin: Negative.   Neurological: Negative.   Endo/Heme/Allergies: Negative.   Psychiatric/Behavioral: Negative.    All other systems reviewed and are negative.     All other systems are reviewed and negative.    PHYSICAL EXAM: VS:  BP 136/62   Pulse 70   Ht 5' 11 (1.803 m)   Wt 180 lb 12.8 oz (82 kg)   SpO2 91%   BMI 25.22 kg/m  , BMI Body mass index is 25.22 kg/m. Last weight:  Wt Readings from Last 3 Encounters:  12/17/23 180 lb 12.8 oz (82 kg)  09/14/23 178 lb 12.8 oz  (81.1 kg)  08/31/23 177 lb (80.3 kg)     Physical Exam Vitals reviewed.  Constitutional:      Appearance: Normal appearance. He is normal weight.  HENT:     Head: Normocephalic.     Nose: Nose normal.     Mouth/Throat:     Mouth: Mucous membranes are moist.  Eyes:     Pupils: Pupils are equal, round, and reactive to light.  Cardiovascular:     Rate and Rhythm: Normal rate and regular rhythm.     Pulses: Normal pulses.     Heart sounds: Normal heart sounds.  Pulmonary:     Effort:  Pulmonary effort is normal.  Abdominal:     General: Abdomen is flat. Bowel sounds are normal.  Musculoskeletal:        General: Normal range of motion.     Cervical back: Normal range of motion.  Skin:    General: Skin is warm.  Neurological:     General: No focal deficit present.     Mental Status: He is alert.  Psychiatric:        Mood and Affect: Mood normal.       EKG:   Recent Labs: 08/31/2023: ALT 10; BUN 53; Hemoglobin 11.0; Platelets 295; Sodium 140; TSH 4.550 09/01/2023: Potassium 5.8 12/11/2023: Creatinine, Ser 1.40    Lipid Panel    Component Value Date/Time   CHOL 95 (L) 08/31/2023 0903   TRIG 68 08/31/2023 0903   HDL 32 (L) 08/31/2023 0903   CHOLHDL 3.0 08/31/2023 0903   LDLCALC 48 08/31/2023 0903      Other studies Reviewed: Additional studies/ records that were reviewed today include:  Review of the above records demonstrates:       No data to display            ASSESSMENT AND PLAN:    ICD-10-CM   1. Leg cramp  R25.2    Has leg cramp,check labs, but patient deffered.    2. Hypertension associated with diabetes (HCC)  E11.59    I15.2     3. Mixed hyperlipidemia  E78.2     4. Coronary artery disease involving native coronary artery of native heart with angina pectoris  I25.119     5. Bilateral carotid artery stenosis  I65.23     6. Type 2 diabetes mellitus with hyperglycemia, without long-term current use of insulin  (HCC)  E11.65         Problem List Items Addressed This Visit       Cardiovascular and Mediastinum   Carotid stenosis   Hypertension associated with diabetes (HCC)   Coronary disease     Other   Hyperlipidemia   Other Visit Diagnoses       Leg cramp    -  Primary   Has leg cramp,check labs, but patient deffered.     Type 2 diabetes mellitus with hyperglycemia, without long-term current use of insulin  (HCC)              Disposition:   Return in about 3 months (around 03/18/2024).    Total time spent: 30 minutes  Signed,  Denyse Bathe, MD  12/17/2023 11:30 AM    [facility]

## 2023-12-21 DIAGNOSIS — B351 Tinea unguium: Secondary | ICD-10-CM | POA: Diagnosis not present

## 2023-12-21 DIAGNOSIS — Z794 Long term (current) use of insulin: Secondary | ICD-10-CM | POA: Diagnosis not present

## 2023-12-21 DIAGNOSIS — L851 Acquired keratosis [keratoderma] palmaris et plantaris: Secondary | ICD-10-CM | POA: Diagnosis not present

## 2023-12-21 DIAGNOSIS — E1140 Type 2 diabetes mellitus with diabetic neuropathy, unspecified: Secondary | ICD-10-CM | POA: Diagnosis not present

## 2023-12-23 ENCOUNTER — Other Ambulatory Visit

## 2023-12-23 DIAGNOSIS — E782 Mixed hyperlipidemia: Secondary | ICD-10-CM | POA: Diagnosis not present

## 2023-12-23 DIAGNOSIS — E1165 Type 2 diabetes mellitus with hyperglycemia: Secondary | ICD-10-CM

## 2023-12-23 DIAGNOSIS — I10 Essential (primary) hypertension: Secondary | ICD-10-CM | POA: Diagnosis not present

## 2023-12-23 DIAGNOSIS — E039 Hypothyroidism, unspecified: Secondary | ICD-10-CM | POA: Diagnosis not present

## 2023-12-24 ENCOUNTER — Other Ambulatory Visit: Payer: Self-pay | Admitting: Internal Medicine

## 2023-12-24 ENCOUNTER — Ambulatory Visit
Admission: RE | Admit: 2023-12-24 | Discharge: 2023-12-24 | Disposition: A | Discharge status: Home / Self Care | Payer: Medicare Other | Source: Ambulatory Visit | Attending: Radiation Oncology | Admitting: Radiation Oncology

## 2023-12-24 ENCOUNTER — Ambulatory Visit: Payer: Self-pay | Admitting: Internal Medicine

## 2023-12-24 ENCOUNTER — Encounter: Payer: Self-pay | Admitting: Radiation Oncology

## 2023-12-24 VITALS — BP 136/61 | HR 63 | Temp 97.0°F | Resp 15 | Ht 71.0 in | Wt 179.5 lb

## 2023-12-24 DIAGNOSIS — M1711 Unilateral primary osteoarthritis, right knee: Secondary | ICD-10-CM | POA: Diagnosis not present

## 2023-12-24 DIAGNOSIS — Z923 Personal history of irradiation: Secondary | ICD-10-CM | POA: Insufficient documentation

## 2023-12-24 DIAGNOSIS — C44311 Basal cell carcinoma of skin of nose: Secondary | ICD-10-CM | POA: Insufficient documentation

## 2023-12-24 DIAGNOSIS — J701 Chronic and other pulmonary manifestations due to radiation: Secondary | ICD-10-CM | POA: Insufficient documentation

## 2023-12-24 DIAGNOSIS — R911 Solitary pulmonary nodule: Secondary | ICD-10-CM

## 2023-12-24 LAB — CBC WITH DIFFERENTIAL/PLATELET
Basophils Absolute: 0 x10E3/uL (ref 0.0–0.2)
Basos: 0 %
EOS (ABSOLUTE): 0.1 x10E3/uL (ref 0.0–0.4)
Eos: 1 %
Hematocrit: 35.5 % — ABNORMAL LOW (ref 37.5–51.0)
Hemoglobin: 11.4 g/dL — ABNORMAL LOW (ref 13.0–17.7)
Immature Grans (Abs): 0 x10E3/uL (ref 0.0–0.1)
Immature Granulocytes: 0 %
Lymphocytes Absolute: 1.2 x10E3/uL (ref 0.7–3.1)
Lymphs: 16 %
MCH: 30.2 pg (ref 26.6–33.0)
MCHC: 32.1 g/dL (ref 31.5–35.7)
MCV: 94 fL (ref 79–97)
Monocytes Absolute: 0.7 x10E3/uL (ref 0.1–0.9)
Monocytes: 9 %
Neutrophils Absolute: 5.5 x10E3/uL (ref 1.4–7.0)
Neutrophils: 74 %
Platelets: 246 x10E3/uL (ref 150–450)
RBC: 3.78 x10E6/uL — ABNORMAL LOW (ref 4.14–5.80)
RDW: 12.4 % (ref 11.6–15.4)
WBC: 7.5 x10E3/uL (ref 3.4–10.8)

## 2023-12-24 LAB — HEMOGLOBIN A1C
Est. average glucose Bld gHb Est-mCnc: 151 mg/dL
Hgb A1c MFr Bld: 6.9 % — ABNORMAL HIGH (ref 4.8–5.6)

## 2023-12-24 LAB — CMP14+EGFR
ALT: 13 IU/L (ref 0–44)
AST: 25 IU/L (ref 0–40)
Albumin: 4.5 g/dL (ref 3.8–4.8)
Alkaline Phosphatase: 46 IU/L — ABNORMAL LOW (ref 47–123)
BUN/Creatinine Ratio: 23 (ref 10–24)
BUN: 34 mg/dL — ABNORMAL HIGH (ref 8–27)
Bilirubin Total: 0.3 mg/dL (ref 0.0–1.2)
CO2: 20 mmol/L (ref 20–29)
Calcium: 9.6 mg/dL (ref 8.6–10.2)
Chloride: 103 mmol/L (ref 96–106)
Creatinine, Ser: 1.51 mg/dL — ABNORMAL HIGH (ref 0.76–1.27)
Globulin, Total: 2.8 g/dL (ref 1.5–4.5)
Glucose: 76 mg/dL (ref 70–99)
Potassium: 5.4 mmol/L — ABNORMAL HIGH (ref 3.5–5.2)
Sodium: 141 mmol/L (ref 134–144)
Total Protein: 7.3 g/dL (ref 6.0–8.5)
eGFR: 47 mL/min/1.73 — ABNORMAL LOW (ref >59)

## 2023-12-24 LAB — LIPID PANEL
Chol/HDL Ratio: 3.1 ratio (ref 0.0–5.0)
Cholesterol, Total: 109 mg/dL (ref 100–199)
HDL: 35 mg/dL — ABNORMAL LOW (ref >39)
LDL Chol Calc (NIH): 55 mg/dL (ref 0–99)
Triglycerides: 101 mg/dL (ref 0–149)
VLDL Cholesterol Cal: 19 mg/dL (ref 5–40)

## 2023-12-24 LAB — TSH: TSH: 5.11 u[IU]/mL — ABNORMAL HIGH (ref 0.450–4.500)

## 2023-12-24 NOTE — Progress Notes (Signed)
 Radiation Oncology Follow up Note  Name: Erik Collins   Date:   12/24/2023 MRN:  [id number] DOB: December 11, 1945    This 78 y.o. 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  REFERRING PROVIDER: [name], MD  HPI: Patient is a 78 year old male now out 3 years completing SBRT to his right upper lobe for non-small cell lung cancer.  He had some other lesions on CT scan in the past which have resolved.  He is seen today in routine follow-up specifically denies cough hemoptysis chest tightness or any change in his pulmonary status..  Recent CT scan of his chest showed unchanged postoperative posttreatment appearance of the chest status post left lower lobectomy assist with scarring and atelectasis.  He also has fibrosis of the right upper lobe consistent with SBRT prior treatment.  No evidence of recurrent or metastatic disease.  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  COMPLICATIONS OF TREATMENT: none  FOLLOW UP COMPLIANCE: keeps appointments   PHYSICAL EXAM:  BP 136/61   Pulse 63   Temp (!) 97 F (36.1 C)   Resp 15   Ht 5' 11 (1.803 m)   Wt 179 lb 8 oz (81.4 kg)   BMI 25.04 kg/m  Well-developed well-nourished patient in NAD. HEENT reveals PERLA, EOMI, discs not visualized.  Oral cavity is clear. No oral mucosal lesions are identified. Neck is clear without evidence of cervical or supraclavicular adenopathy. Lungs are clear to A&P. Cardiac examination is essentially unremarkable with regular rate and rhythm without murmur rub or thrill. Abdomen is benign with no organomegaly or masses noted. Motor sensory and DTR levels are equal and  symmetric in the upper and lower extremities. Cranial nerves II through XII are grossly intact. Proprioception is intact. No peripheral adenopathy or edema is identified. No motor or sensory levels are noted. Crude visual fields are within normal range.  Range of motion of his right knee does elicit pain.  There is good motor strength in his right lower extremity.  RADIOLOGY RESULTS: CT scan reviewed compatible with above-stated findings  PLAN: At the present time patient is doing well with no evidence of disease from his prior SBRT of his chest.  We did discuss low-dose rate treatments for osteoarthritis.  I think he is a good candidate.  I would plan on delivering 3 Gray in 6 fractions.  Risks and benefits of treatment including extremely low side effect profile with him was 0 side effects .  I have arranged for CT simulation of his right knee.  Patient comprehends my recommendations well.  I would like to take this opportunity to thank you for allowing me to participate in the care of your patient.SABRA Marcey Penton, MD

## 2023-12-28 DIAGNOSIS — D631 Anemia in chronic kidney disease: Secondary | ICD-10-CM | POA: Diagnosis not present

## 2023-12-28 DIAGNOSIS — E785 Hyperlipidemia, unspecified: Secondary | ICD-10-CM | POA: Diagnosis not present

## 2023-12-28 DIAGNOSIS — N1831 Chronic kidney disease, stage 3a: Secondary | ICD-10-CM | POA: Diagnosis not present

## 2023-12-28 DIAGNOSIS — I10 Essential (primary) hypertension: Secondary | ICD-10-CM | POA: Diagnosis not present

## 2023-12-28 DIAGNOSIS — E1129 Type 2 diabetes mellitus with other diabetic kidney complication: Secondary | ICD-10-CM | POA: Diagnosis not present

## 2023-12-28 DIAGNOSIS — E875 Hyperkalemia: Secondary | ICD-10-CM | POA: Diagnosis not present

## 2023-12-28 DIAGNOSIS — R809 Proteinuria, unspecified: Secondary | ICD-10-CM | POA: Diagnosis not present

## 2024-01-01 ENCOUNTER — Encounter: Payer: Self-pay | Admitting: Internal Medicine

## 2024-01-01 ENCOUNTER — Ambulatory Visit: Admitting: Internal Medicine

## 2024-01-01 ENCOUNTER — Ambulatory Visit: Payer: Self-pay | Admitting: Internal Medicine

## 2024-01-01 VITALS — BP 132/66 | HR 65 | Ht 71.0 in | Wt 179.4 lb

## 2024-01-01 DIAGNOSIS — E1122 Type 2 diabetes mellitus with diabetic chronic kidney disease: Secondary | ICD-10-CM | POA: Diagnosis not present

## 2024-01-01 DIAGNOSIS — E039 Hypothyroidism, unspecified: Secondary | ICD-10-CM | POA: Diagnosis not present

## 2024-01-01 DIAGNOSIS — E1159 Type 2 diabetes mellitus with other circulatory complications: Secondary | ICD-10-CM | POA: Diagnosis not present

## 2024-01-01 DIAGNOSIS — E1169 Type 2 diabetes mellitus with other specified complication: Secondary | ICD-10-CM

## 2024-01-01 DIAGNOSIS — E782 Mixed hyperlipidemia: Secondary | ICD-10-CM

## 2024-01-01 DIAGNOSIS — J449 Chronic obstructive pulmonary disease, unspecified: Secondary | ICD-10-CM

## 2024-01-01 DIAGNOSIS — Z23 Encounter for immunization: Secondary | ICD-10-CM | POA: Diagnosis not present

## 2024-01-01 DIAGNOSIS — I152 Hypertension secondary to endocrine disorders: Secondary | ICD-10-CM

## 2024-01-01 DIAGNOSIS — E663 Overweight: Secondary | ICD-10-CM | POA: Insufficient documentation

## 2024-01-01 DIAGNOSIS — Z6825 Body mass index (BMI) 25.0-25.9, adult: Secondary | ICD-10-CM | POA: Diagnosis not present

## 2024-01-01 DIAGNOSIS — E1165 Type 2 diabetes mellitus with hyperglycemia: Secondary | ICD-10-CM

## 2024-01-01 DIAGNOSIS — Z85118 Personal history of other malignant neoplasm of bronchus and lung: Secondary | ICD-10-CM | POA: Diagnosis not present

## 2024-01-01 DIAGNOSIS — N1831 Chronic kidney disease, stage 3a: Secondary | ICD-10-CM

## 2024-01-01 LAB — POCT CBG (FASTING - GLUCOSE)-MANUAL ENTRY: Glucose Fasting, POC: 169 mg/dL — AB (ref 70–99)

## 2024-01-01 MED ORDER — LEVOTHYROXINE SODIUM 25 MCG PO TABS: 12.5000 ug | ORAL_TABLET | Freq: Every day | ORAL | 3 refills | Status: AC

## 2024-01-01 NOTE — Progress Notes (Addendum)
 Established Patient Office Visit  Subjective:  Patient ID: Erik Collins, male    DOB: 31-Jan-1946  Age: 78 y.o. MRN: [id number]  Chief Complaint  Patient presents with   Follow-up    4 month follow up    Patient is here today for follow up. He reports he is doing well today. Discussed labs in detail today. HbgA1c has improved to 6.9% from 7.4% reinforced healthy diet and exercise as tolerated. He is actively using his CGM.  TSH was abnormal on lab work. Repeat still above normal. There is no goiter and no tenderness. CT chest mentioned norma thyroid. Developed acquired hypothyroidism; patient reports being asymptomatic. However will start low dose levothyroxine 25 micrograms , by taking 1/2 tablet, monitor closely. He recently completed low dose chest CT screening for lung cancer. He has not smoked in the last 14 years. Continues to have knee pain.  Patient would like a flu shot today.    No other concerns at this time.   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   R nose lateral supratip - referred for radiation oncology   Cancer [facility])    hx lung cancer   CHF (congestive heart failure) (HCC)    Complication of anesthesia    knee surgery caused arrythmia requiring a temporary pacemaker   COPD (chronic obstructive pulmonary disease) (HCC)    Coronary artery disease    s/p CABG   Diabetes mellitus without complication (HCC)    Diverticulosis    Duodenitis    Dysrhythmia    during total knee replacement patient required a temporary pacemaker   GERD (gastroesophageal reflux disease)    Hypercholesterolemia    Hyperplastic colon polyp    Hypertension    Internal hemorrhoids    Lung cancer (HCC)    Lung cancer (HCC)    Myocardial infarction (HCC)    09/2009 f/up with Dr. Denyse Bathe   Sleep apnea     Past Surgical History:  Procedure Laterality Date   BACK SURGERY     CATARACT EXTRACTION W/PHACO Left  11/09/2019   Procedure: CATARACT EXTRACTION PHACO AND INTRAOCULAR LENS PLACEMENT (IOC) LEFT DIABETIC MALYUGIN 5.91 01:01.0 9.6%;  Surgeon: Mittie Gaskin, MD;  Location: [facility];  Service: Ophthalmology;  Laterality: Left;   CATARACT EXTRACTION W/PHACO Right 12/07/2019   Procedure: CATARACT EXTRACTION PHACO AND INTRAOCULAR LENS PLACEMENT (IOC) RIGHT DIABETIC MALYUGIN 6.38 01:05.1 9.8%;  Surgeon: Mittie Gaskin, MD;  Location: [facility]GERY [facility];  Service: Ophthalmology;  Laterality: Right;   COLONOSCOPY N/A 11/23/2019   Procedure: COLONOSCOPY;  Surgeon: Toledo, Ladell POUR, MD;  Location: [facility] ENDOSCOPY;  Service: Gastroenterology;  Laterality: N/A;   COLONOSCOPY WITH PROPOFOL  N/A 11/09/2014   Procedure: COLONOSCOPY WITH PROPOFOL ;  Surgeon: Donnice Vaughn Manes, MD;  Location: [facility] ENDOSCOPY;  Service: Endoscopy;  Laterality: N/A;   CORONARY ANGIOPLASTY     CORONARY ARTERY BYPASS GRAFT  09/2009   quadruple, done at [facility]   CORONARY ARTERY BYPASS GRAFT     ESOPHAGOGASTRODUODENOSCOPY (EGD) WITH PROPOFOL  N/A 11/09/2014   Procedure: ESOPHAGOGASTRODUODENOSCOPY (EGD) WITH PROPOFOL ;  Surgeon: Donnice Vaughn Manes, MD;  Location: [facility] ENDOSCOPY;  Service: Endoscopy;  Laterality: N/A;   left lower lobectomy  09/2009   at [facility] LAMINECTOMY/DECOMPRESSION MICRODISCECTOMY  12/17/2011   Procedure: LUMBAR LAMINECTOMY/DECOMPRESSION MICRODISCECTOMY 1 LEVEL;  Surgeon: Alm GORMAN Molt, MD;  Location: [facility] NEURO ORS;  Service: Neurosurgery;  Laterality: Right;  Right Lumbar  five-sacral one microdiscectomy   TEMPORARY PACEMAKER Right 07/10/2017   Procedure: TEMPORARY PACEMAKER;  Surgeon: Ammon Blunt, MD;  Location: [facility] INVASIVE CV LAB;  Service: Cardiovascular;  Laterality: Right;   TOTAL KNEE ARTHROPLASTY Left 07/08/2017   Procedure: TOTAL KNEE ARTHROPLASTY;  Surgeon: Cleotilde Barrio, MD;  Location: [facility] ORS;  Service: Orthopedics;  Laterality: Left;    Social History   Socioeconomic  History   Marital status: Married    Spouse name: Not on file   Number of children: Not on file   Years of education: Not on file   Highest education level: Not on file  Occupational History   Not on file  Tobacco Use   Smoking status: Former    Current packs/day: 0.00    Types: Cigarettes    Quit date: 09/19/2009    Years since quitting: 14.2   Smokeless tobacco: Never  Vaping Use   Vaping status: Never Used  Substance and Sexual Activity   Alcohol use: No   Drug use: No   Sexual activity: Not on file  Other Topics Concern   Not on file  Social History Narrative   Not on file   Social Drivers of Health   Financial Resource Strain: Patient Declined (04/13/2023)   Received from [facility]   Overall Financial Resource Strain (CARDIA)    Difficulty of Paying Living Expenses: Patient declined  Food Insecurity: Patient Declined (04/13/2023)   Received from [facility]   Hunger Vital Sign    Within the past 12 months, you worried that your food would run out before you got the money to buy more.: Patient declined    Within the past 12 months, the food you bought just didn't last and you didn't have money to get more.: Patient declined  Transportation Needs: Patient Declined (04/13/2023)   Received from [facility] - Transportation    In the past 12 months, has lack of transportation kept you from medical appointments or from getting medications?: Patient declined    Lack of Transportation (Non-Medical): Patient declined  Physical Activity: Not on file  Stress: Not on file  Social Connections: Not on file  Intimate Partner Violence: Not on file    Family History  Problem Relation Age of Onset   Heart disease Mother    Diabetes Mother    Heart disease Father    Stroke Father    Heart attack Father     Allergies  Allergen Reactions   Spironolactone    Statins     Outpatient Medications Prior to Visit   Medication Sig   acetaminophen  (TYLENOL ) 650 MG CR tablet Take 1,300 mg by mouth 2 (two) times daily.   aspirin  EC 81 MG tablet Take 81 mg by mouth daily.   carvedilol  (COREG ) 12.5 MG tablet TAKE 1 TABLET BY MOUTH 2 TIMES DAILY WITH A MEAL   chlorthalidone (HYGROTON) 25 MG tablet Take 25 mg by mouth daily.   ezetimibe  (ZETIA ) 10 MG tablet TAKE 1 TABLET BY MOUTH DAILY   gabapentin  (NEURONTIN ) 300 MG capsule Take one tablet by mouth at 12pm daily   gabapentin  (NEURONTIN ) 600 MG tablet TAKE 1 TABLET BY MOUTH TWICE A DAY   glucose blood (CONTOUR NEXT TEST) test strip USE TO TEST SUGARS 3 TIMES DAILY   insulin  glargine, 2 Unit Dial, (TOUJEO  MAX SOLOSTAR) 300 UNIT/ML Solostar Pen Inject 40 Units into the skin at bedtime.   JARDIANCE  25 MG TABS tablet TAKE  ONE TABLET BY MOUTH DAILY   Magnesium  400 MG CAPS Take by mouth.   metFORMIN  (GLUCOPHAGE ) 1000 MG tablet TAKE 1 TABLET TWICE DAILY   NEXLETOL  180 MG TABS TAKE 1 TABLET BY MOUTH DAILY   Omega-3 Fatty Acids (FISH OIL) 1200 MG CAPS Take 1,200 mg by mouth 2 (two) times daily.   omeprazole  (PRILOSEC) 20 MG capsule TAKE 1 CAPSULE EVERY DAY   vitamin B-12 (CYANOCOBALAMIN ) 500 MCG tablet Take 500 mcg by mouth daily.   No facility-administered medications prior to visit.    Review of Systems  Constitutional: Negative.  Negative for chills, fever and malaise/fatigue.  HENT: Negative.  Negative for congestion and sore throat.   Eyes: Negative.  Negative for blurred vision and pain.  Respiratory: Negative.  Negative for cough and shortness of breath.   Cardiovascular: Negative.  Negative for chest pain, palpitations and leg swelling.  Gastrointestinal: Negative.  Negative for abdominal pain, blood in stool, constipation, diarrhea, heartburn, melena, nausea and vomiting.  Genitourinary: Negative.  Negative for dysuria, flank pain, frequency and urgency.  Musculoskeletal:  Positive for joint pain. Negative for myalgias.  Skin: Negative.   Neurological:  Negative.  Negative for dizziness, tingling, sensory change, weakness and headaches.  Endo/Heme/Allergies: Negative.   Psychiatric/Behavioral: Negative.  Negative for depression and suicidal ideas. The patient is not nervous/anxious.        Objective:   BP 132/66   Pulse 65   Ht 5' 11 (1.803 m)   Wt 179 lb 6.4 oz (81.4 kg)   SpO2 97%   BMI 25.02 kg/m   Vitals:   01/01/24 1116  BP: 132/66  Pulse: 65  Height: 5' 11 (1.803 m)  Weight: 179 lb 6.4 oz (81.4 kg)  SpO2: 97%  BMI (Calculated): 25.03    Physical Exam Vitals and nursing note reviewed.  Constitutional:      General: He is not in acute distress.    Appearance: Normal appearance. He is not ill-appearing.  HENT:     Head: Normocephalic and atraumatic.     Nose: Nose normal.     Mouth/Throat:     Mouth: Mucous membranes are moist.     Pharynx: Oropharynx is clear.  Eyes:     Conjunctiva/sclera: Conjunctivae normal.     Pupils: Pupils are equal, round, and reactive to light.  Cardiovascular:     Rate and Rhythm: Normal rate and regular rhythm.     Pulses: Normal pulses.     Heart sounds: Normal heart sounds.  Pulmonary:     Effort: Pulmonary effort is normal.     Breath sounds: Normal breath sounds. No wheezing or rhonchi.  Abdominal:     General: Bowel sounds are normal. There is no distension.     Palpations: Abdomen is soft.     Tenderness: There is no abdominal tenderness.  Musculoskeletal:        General: Normal range of motion.     Cervical back: Normal range of motion and neck supple.     Right lower leg: No edema.     Left lower leg: No edema.  Skin:    General: Skin is warm and dry.     Capillary Refill: Capillary refill takes less than 2 seconds.  Neurological:     General: No focal deficit present.     Mental Status: He is alert and oriented to person, place, and time.     Sensory: No sensory deficit.     Motor: No weakness.  Psychiatric:  Mood and Affect: Mood normal.         Behavior: Behavior normal.        Judgment: Judgment normal.      Results for orders placed or performed in visit on 01/01/24  POCT CBG (Fasting - Glucose)  Result Value Ref Range   Glucose Fasting, POC 169 (A) 70 - 99 mg/dL    Recent Results (from the past 2160 hours)  I-STAT creatinine     Status: Abnormal   Collection Time: 12/11/23  8:11 AM  Result Value Ref Range   Creatinine, Ser 1.40 (H) 0.61 - 1.24 mg/dL  CBC with Differential/Platelet     Status: Abnormal   Collection Time: 12/23/23  8:28 AM  Result Value Ref Range   WBC 7.5 3.4 - 10.8 x10E3/uL   RBC 3.78 (L) 4.14 - 5.80 x10E6/uL   Hemoglobin 11.4 (L) 13.0 - 17.7 g/dL   Hematocrit 64.4 (L) 62.4 - 51.0 %   MCV 94 79 - 97 fL   MCH 30.2 26.6 - 33.0 pg   MCHC 32.1 31.5 - 35.7 g/dL   RDW 87.5 88.3 - 84.5 %   Platelets 246 150 - 450 x10E3/uL   Neutrophils 74 Not Estab. %   Lymphs 16 Not Estab. %   Monocytes 9 Not Estab. %   Eos 1 Not Estab. %   Basos 0 Not Estab. %   Neutrophils Absolute 5.5 1.4 - 7.0 x10E3/uL   Lymphocytes Absolute 1.2 0.7 - 3.1 x10E3/uL   Monocytes Absolute 0.7 0.1 - 0.9 x10E3/uL   EOS (ABSOLUTE) 0.1 0.0 - 0.4 x10E3/uL   Basophils Absolute 0.0 0.0 - 0.2 x10E3/uL   Immature Granulocytes 0 Not Estab. %   Immature Grans (Abs) 0.0 0.0 - 0.1 x10E3/uL  CMP14+EGFR     Status: Abnormal   Collection Time: 12/23/23  8:28 AM  Result Value Ref Range   Glucose 76 70 - 99 mg/dL   BUN 34 (H) 8 - 27 mg/dL   Creatinine, Ser 8.48 (H) 0.76 - 1.27 mg/dL   eGFR 47 (L) >40 fO/fpw/8.26   BUN/Creatinine Ratio 23 10 - 24   Sodium 141 134 - 144 mmol/L   Potassium 5.4 (H) 3.5 - 5.2 mmol/L   Chloride 103 96 - 106 mmol/L   CO2 20 20 - 29 mmol/L   Calcium 9.6 8.6 - 10.2 mg/dL   Total Protein 7.3 6.0 - 8.5 g/dL   Albumin 4.5 3.8 - 4.8 g/dL   Globulin, Total 2.8 1.5 - 4.5 g/dL   Bilirubin Total 0.3 0.0 - 1.2 mg/dL   Alkaline Phosphatase 46 (L) 47 - 123 IU/L   AST 25 0 - 40 IU/L   ALT 13 0 - 44 IU/L  Hemoglobin A1c      Status: Abnormal   Collection Time: 12/23/23  8:28 AM  Result Value Ref Range   Hgb A1c MFr Bld 6.9 (H) 4.8 - 5.6 %    Comment:          Prediabetes: 5.7 - 6.4          Diabetes: >6.4          Glycemic control for adults with diabetes: <7.0    Est. average glucose Bld gHb Est-mCnc 151 mg/dL  Lipid panel     Status: Abnormal   Collection Time: 12/23/23  8:28 AM  Result Value Ref Range   Cholesterol, Total 109 100 - 199 mg/dL   Triglycerides 898 0 - 149 mg/dL   HDL 35 (L) >  39 mg/dL   VLDL Cholesterol Cal 19 5 - 40 mg/dL   LDL Chol Calc (NIH) 55 0 - 99 mg/dL   Chol/HDL Ratio 3.1 0.0 - 5.0 ratio    Comment:                                   T. Chol/HDL Ratio                                             Men  Women                               1/2 Avg.Risk  3.4    3.3                                   Avg.Risk  5.0    4.4                                2X Avg.Risk  9.6    7.1                                3X Avg.Risk 23.4   11.0   TSH     Status: Abnormal   Collection Time: 12/23/23  8:28 AM  Result Value Ref Range   TSH 5.110 (H) 0.450 - 4.500 uIU/mL  POCT CBG (Fasting - Glucose)     Status: Abnormal   Collection Time: 01/01/24 11:22 AM  Result Value Ref Range   Glucose Fasting, POC 169 (A) 70 - 99 mg/dL      Assessment & Plan:  Start levothyroxine 12.5 mg daily in the morning before eating breakfast or taking other medications. Flu shot given today. Discussed labs in detail and reinforced healthy diet and exercise as tolerated. Problem List Items Addressed This Visit     Hypertension associated with diabetes (HCC) - Primary   Diabetes mellitus with stage 3 chronic kidney disease (HCC)   COPD (chronic obstructive pulmonary disease) (HCC)   Recurrent non-small cell lung cancer (HCC)   Combined hyperlipidemia associated with type 2 diabetes mellitus (HCC)   Acquired hypothyroidism   Relevant Medications   levothyroxine (SYNTHROID) 25 MCG tablet   Other Relevant Orders    TSH+T4F+T3Free   Type 2 diabetes mellitus with hyperglycemia, without long-term current use of insulin  (HCC)   Relevant Orders   POCT CBG (Fasting - Glucose) (Completed)   Needs flu shot   Relevant Orders   Flu Vaccine Trivalent High Dose (Fluad) (Completed)   Overweight with body mass index (BMI) of 25 to 25.9 in adult   CKD stage 3a, GFR 45-59 ml/min (HCC)    Return in about 4 weeks (around 01/29/2024).   Total time spent: 30 minutes. This time includes review of previous notes and results and patient face to face interaction during today's visit.    [name], MD  01/01/2024   This document may have been prepared by [facility] Voice Recognition software and as such may include unintentional dictation errors.

## 2024-01-05 ENCOUNTER — Ambulatory Visit
Admission: RE | Admit: 2024-01-05 | Discharge: 2024-01-05 | Disposition: A | Discharge status: Home / Self Care | Source: Ambulatory Visit | Attending: Radiation Oncology | Admitting: Radiation Oncology

## 2024-01-05 DIAGNOSIS — C44311 Basal cell carcinoma of skin of nose: Secondary | ICD-10-CM | POA: Diagnosis not present

## 2024-01-05 DIAGNOSIS — M1711 Unilateral primary osteoarthritis, right knee: Secondary | ICD-10-CM | POA: Diagnosis not present

## 2024-01-06 ENCOUNTER — Telehealth: Payer: Self-pay

## 2024-01-06 NOTE — Telephone Encounter (Signed)
 Filled and email pap [employer] Jardiance  and [employer] Jon pt is coming to office to sign paper work.

## 2024-01-07 DIAGNOSIS — C44311 Basal cell carcinoma of skin of nose: Secondary | ICD-10-CM | POA: Diagnosis not present

## 2024-01-07 DIAGNOSIS — M1711 Unilateral primary osteoarthritis, right knee: Secondary | ICD-10-CM | POA: Diagnosis not present

## 2024-01-12 ENCOUNTER — Telehealth: Payer: Self-pay

## 2024-01-12 NOTE — Progress Notes (Signed)
   01/12/2024  Patient ID: Erik Collins, male   DOB: 08/18/1945, 78 y.o.   MRN: [id number]  Contacted patient to make him aware that patient assistance renewal paperwork for Jardiance  and Touejo Max for 2026 is waiting at office for him to come by and sign.  Patient plans to come in next week, also aware to bring proof of income.  Will collaborate with provider to complete their portion once patient has completed this step.  Patient also provides consent to renew Nexletol  once re-enrollment window opens next month.  [name], PharmD Clinical Pharmacist [phone]

## 2024-01-18 ENCOUNTER — Other Ambulatory Visit: Payer: Self-pay

## 2024-01-18 ENCOUNTER — Ambulatory Visit
Admission: RE | Admit: 2024-01-18 | Discharge: 2024-01-18 | Disposition: A | Source: Ambulatory Visit | Attending: Radiation Oncology | Admitting: Radiation Oncology

## 2024-01-18 DIAGNOSIS — C44311 Basal cell carcinoma of skin of nose: Secondary | ICD-10-CM | POA: Insufficient documentation

## 2024-01-18 DIAGNOSIS — M1711 Unilateral primary osteoarthritis, right knee: Secondary | ICD-10-CM | POA: Diagnosis not present

## 2024-01-18 LAB — RAD ONC ARIA SESSION SUMMARY
Course Elapsed Days: 0
Plan Fractions Treated to Date: 1
Plan Prescribed Dose Per Fraction: 0.5 Gy
Plan Total Fractions Prescribed: 6
Plan Total Prescribed Dose: 3 Gy
Reference Point Dosage Given to Date: 0.5 Gy
Reference Point Session Dosage Given: 0.5 Gy
Session Number: 1

## 2024-01-19 ENCOUNTER — Telehealth: Payer: Self-pay

## 2024-01-19 NOTE — Progress Notes (Signed)
   01/19/2024  Patient ID: Erik Collins, male   DOB: September 27, 1945, 78 y.o.   MRN: [id number]  Submitted completed application to [employer] for 2026 renewal of Toujeo  Max Solostar PAP, pending company response.  Submitted completed application to [facility] [employer] for 2026 renewal of Jardiance  PAP, pending company response.  [name], PharmD Clinical Pharmacist [phone]

## 2024-01-20 ENCOUNTER — Other Ambulatory Visit: Payer: Self-pay

## 2024-01-20 ENCOUNTER — Ambulatory Visit
Admission: RE | Admit: 2024-01-20 | Discharge: 2024-01-20 | Disposition: A | Source: Ambulatory Visit | Attending: Radiation Oncology | Admitting: Radiation Oncology

## 2024-01-20 DIAGNOSIS — C44311 Basal cell carcinoma of skin of nose: Secondary | ICD-10-CM | POA: Diagnosis not present

## 2024-01-20 LAB — RAD ONC ARIA SESSION SUMMARY
Course Elapsed Days: 2
Plan Fractions Treated to Date: 2
Plan Prescribed Dose Per Fraction: 0.5 Gy
Plan Total Fractions Prescribed: 6
Plan Total Prescribed Dose: 3 Gy
Reference Point Dosage Given to Date: 1 Gy
Reference Point Session Dosage Given: 0.5 Gy
Session Number: 2

## 2024-01-23 ENCOUNTER — Other Ambulatory Visit: Payer: Self-pay | Admitting: Cardiovascular Disease

## 2024-01-23 DIAGNOSIS — I25119 Atherosclerotic heart disease of native coronary artery with unspecified angina pectoris: Secondary | ICD-10-CM

## 2024-01-23 DIAGNOSIS — I6523 Occlusion and stenosis of bilateral carotid arteries: Secondary | ICD-10-CM

## 2024-01-25 ENCOUNTER — Other Ambulatory Visit

## 2024-01-25 ENCOUNTER — Ambulatory Visit
Admission: RE | Admit: 2024-01-25 | Discharge: 2024-01-25 | Disposition: A | Source: Ambulatory Visit | Attending: Radiation Oncology | Admitting: Radiation Oncology

## 2024-01-25 ENCOUNTER — Other Ambulatory Visit: Payer: Self-pay

## 2024-01-25 DIAGNOSIS — E039 Hypothyroidism, unspecified: Secondary | ICD-10-CM | POA: Diagnosis not present

## 2024-01-25 DIAGNOSIS — C44311 Basal cell carcinoma of skin of nose: Secondary | ICD-10-CM | POA: Diagnosis not present

## 2024-01-25 LAB — RAD ONC ARIA SESSION SUMMARY
Course Elapsed Days: 7
Plan Fractions Treated to Date: 3
Plan Prescribed Dose Per Fraction: 0.5 Gy
Plan Total Fractions Prescribed: 6
Plan Total Prescribed Dose: 3 Gy
Reference Point Dosage Given to Date: 1.5 Gy
Reference Point Session Dosage Given: 0.5 Gy
Session Number: 3

## 2024-01-26 LAB — TSH+T4F+T3FREE
Free T4: 1.29 ng/dL (ref 0.82–1.77)
T3, Free: 2.4 pg/mL (ref 2.0–4.4)
TSH: 3.05 u[IU]/mL (ref 0.450–4.500)

## 2024-01-27 ENCOUNTER — Other Ambulatory Visit: Payer: Self-pay

## 2024-01-27 ENCOUNTER — Ambulatory Visit
Admission: RE | Admit: 2024-01-27 | Discharge: 2024-01-27 | Disposition: A | Discharge status: Home / Self Care | Source: Ambulatory Visit | Attending: Radiation Oncology | Admitting: Radiation Oncology

## 2024-01-27 DIAGNOSIS — C44311 Basal cell carcinoma of skin of nose: Secondary | ICD-10-CM | POA: Diagnosis not present

## 2024-01-27 LAB — RAD ONC ARIA SESSION SUMMARY
Course Elapsed Days: 9
Plan Fractions Treated to Date: 4
Plan Prescribed Dose Per Fraction: 0.5 Gy
Plan Total Fractions Prescribed: 6
Plan Total Prescribed Dose: 3 Gy
Reference Point Dosage Given to Date: 2 Gy
Reference Point Session Dosage Given: 0.5 Gy
Session Number: 4

## 2024-01-29 ENCOUNTER — Ambulatory Visit (INDEPENDENT_AMBULATORY_CARE_PROVIDER_SITE_OTHER): Admitting: Internal Medicine

## 2024-01-29 ENCOUNTER — Encounter: Payer: Self-pay | Admitting: Internal Medicine

## 2024-01-29 ENCOUNTER — Telehealth: Payer: Self-pay

## 2024-01-29 ENCOUNTER — Ambulatory Visit: Payer: Self-pay | Admitting: Internal Medicine

## 2024-01-29 VITALS — BP 130/66 | HR 68 | Ht 71.0 in | Wt 180.0 lb

## 2024-01-29 DIAGNOSIS — I25119 Atherosclerotic heart disease of native coronary artery with unspecified angina pectoris: Secondary | ICD-10-CM

## 2024-01-29 DIAGNOSIS — E782 Mixed hyperlipidemia: Secondary | ICD-10-CM

## 2024-01-29 DIAGNOSIS — E1169 Type 2 diabetes mellitus with other specified complication: Secondary | ICD-10-CM | POA: Diagnosis not present

## 2024-01-29 DIAGNOSIS — E039 Hypothyroidism, unspecified: Secondary | ICD-10-CM

## 2024-01-29 DIAGNOSIS — I152 Hypertension secondary to endocrine disorders: Secondary | ICD-10-CM | POA: Diagnosis not present

## 2024-01-29 DIAGNOSIS — J449 Chronic obstructive pulmonary disease, unspecified: Secondary | ICD-10-CM | POA: Diagnosis not present

## 2024-01-29 DIAGNOSIS — E1159 Type 2 diabetes mellitus with other circulatory complications: Secondary | ICD-10-CM

## 2024-01-29 DIAGNOSIS — E1165 Type 2 diabetes mellitus with hyperglycemia: Secondary | ICD-10-CM

## 2024-01-29 LAB — POCT CBG (FASTING - GLUCOSE)-MANUAL ENTRY: Glucose Fasting, POC: 189 mg/dL — AB (ref 70–99)

## 2024-01-29 NOTE — Progress Notes (Signed)
 Established Patient Office Visit  Subjective:  Patient ID: Erik Collins, male    DOB: 23-Nov-1945  Age: 78 y.o. MRN: [id number]  Chief Complaint  Patient presents with   Follow-up    4 week follow up    Patient comes in for his follow-up of thyroid profile.  His TSH has been abnormal/high her previous few months.  Patient was started on a small dose of Levoxyl  12.5 mg daily.  He is tolerating it well, denies chest pain, no palpitations, no shortness of breath.  His labs done yesterday show normal TSH with normal T3 and T4.  Will continue the current small dose for now.  Labs will be repeated in  2 months and will adjust further if needed.    No other concerns at this time.   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   R nose lateral supratip - referred for radiation oncology   Cancer [facility])    hx lung cancer   CHF (congestive heart failure) (HCC)    Complication of anesthesia    knee surgery caused arrythmia requiring a temporary pacemaker   COPD (chronic obstructive pulmonary disease) (HCC)    Coronary artery disease    s/p CABG   Diabetes mellitus without complication (HCC)    Diverticulosis    Duodenitis    Dysrhythmia    during total knee replacement patient required a temporary pacemaker   GERD (gastroesophageal reflux disease)    Hypercholesterolemia    Hyperplastic colon polyp    Hypertension    Internal hemorrhoids    Lung cancer (HCC)    Lung cancer (HCC)    Myocardial infarction (HCC)    09/2009 f/up with Dr. Denyse Bathe   Sleep apnea     Past Surgical History:  Procedure Laterality Date   BACK SURGERY     CATARACT EXTRACTION W/PHACO Left 11/09/2019   Procedure: CATARACT EXTRACTION PHACO AND INTRAOCULAR LENS PLACEMENT (IOC) LEFT DIABETIC MALYUGIN 5.91 01:01.0 9.6%;  Surgeon: Mittie Gaskin, MD;  Location: [facility];  Service: Ophthalmology;  Laterality: Left;   CATARACT  EXTRACTION W/PHACO Right 12/07/2019   Procedure: CATARACT EXTRACTION PHACO AND INTRAOCULAR LENS PLACEMENT (IOC) RIGHT DIABETIC MALYUGIN 6.38 01:05.1 9.8%;  Surgeon: Mittie Gaskin, MD;  Location: [facility];  Service: Ophthalmology;  Laterality: Right;   COLONOSCOPY N/A 11/23/2019   Procedure: COLONOSCOPY;  Surgeon: Toledo, Ladell POUR, MD;  Location: [facility] ENDOSCOPY;  Service: Gastroenterology;  Laterality: N/A;   COLONOSCOPY WITH PROPOFOL  N/A 11/09/2014   Procedure: COLONOSCOPY WITH PROPOFOL ;  Surgeon: Donnice Vaughn Manes, MD;  Location: [facility] ENDOSCOPY;  Service: Endoscopy;  Laterality: N/A;   CORONARY ANGIOPLASTY     CORONARY ARTERY BYPASS GRAFT  09/2009   quadruple, done at [facility]   CORONARY ARTERY BYPASS GRAFT     ESOPHAGOGASTRODUODENOSCOPY (EGD) WITH PROPOFOL  N/A 11/09/2014   Procedure: ESOPHAGOGASTRODUODENOSCOPY (EGD) WITH PROPOFOL ;  Surgeon: Donnice Vaughn Manes, MD;  Location: [facility] ENDOSCOPY;  Service: Endoscopy;  Laterality: N/A;   left lower lobectomy  09/2009   at [facility] LAMINECTOMY/DECOMPRESSION MICRODISCECTOMY  12/17/2011   Procedure: LUMBAR LAMINECTOMY/DECOMPRESSION MICRODISCECTOMY 1 LEVEL;  Surgeon: Alm GORMAN Molt, MD;  Location: [facility] NEURO ORS;  Service: Neurosurgery;  Laterality: Right;  Right Lumbar five-sacral one microdiscectomy   TEMPORARY PACEMAKER Right 07/10/2017   Procedure: TEMPORARY PACEMAKER;  Surgeon: Ammon Blunt, MD;  Location: [facility] INVASIVE CV LAB;  Service: Cardiovascular;  Laterality: Right;  TOTAL KNEE ARTHROPLASTY Left 07/08/2017   Procedure: TOTAL KNEE ARTHROPLASTY;  Surgeon: Cleotilde Barrio, MD;  Location: [facility] ORS;  Service: Orthopedics;  Laterality: Left;    Social History   Socioeconomic History   Marital status: Married    Spouse name: Not on file   Number of children: Not on file   Years of education: Not on file   Highest education level: Not on file  Occupational History   Not on file  Tobacco Use   Smoking status: Former     Current packs/day: 0.00    Types: Cigarettes    Quit date: 09/19/2009    Years since quitting: 14.3   Smokeless tobacco: Never  Vaping Use   Vaping status: Never Used  Substance and Sexual Activity   Alcohol use: No   Drug use: No   Sexual activity: Not on file  Other Topics Concern   Not on file  Social History Narrative   Not on file   Social Drivers of Health   Tobacco Use: Medium Risk (01/29/2024)   Patient History    Smoking Tobacco Use: Former    Smokeless Tobacco Use: Never    Passive Exposure: Not on file  Financial Resource Strain: Patient Declined (04/13/2023)   Received from [facility]   Overall Financial Resource Strain (CARDIA)    Difficulty of Paying Living Expenses: Patient declined  Food Insecurity: Patient Declined (04/13/2023)   Received from [facility]   Epic    Within the past 12 months, you worried that your food would run out before you got the money to buy more.: Patient declined    Within the past 12 months, the food you bought just didn't last and you didn't have money to get more.: Patient declined  Transportation Needs: Patient Declined (04/13/2023)   Received from [facility] - Transportation    In the past 12 months, has lack of transportation kept you from medical appointments or from getting medications?: Patient declined    Lack of Transportation (Non-Medical): Patient declined  Physical Activity: Not on file  Stress: Not on file  Social Connections: Not on file  Intimate Partner Violence: Not on file  Depression (PHQ2-9): Low Risk (12/24/2023)   Depression (PHQ2-9)    PHQ-2 Score: 0  Alcohol Screen: Not on file  Housing: Patient Declined (04/13/2023)   Received from [facility]   Epic    In the last 12 months, was there a time when you were not able to pay the mortgage or rent on time?: Patient declined    Number of Times Moved in the Last Year: Not on file     At any time in the past 12 months, were you homeless or living in a shelter (including now)?: Patient declined  Utilities: Patient Declined (04/13/2023)   Received from [facility] Utilities    Threatened with loss of utilities: Patient declined  Health Literacy: Not on file    Family History  Problem Relation Age of Onset   Heart disease Mother    Diabetes Mother    Heart disease Father    Stroke Father    Heart attack Father     Allergies[1]  Show/hide medication list[2]  Review of Systems  Constitutional: Negative.  Negative for chills, fever and malaise/fatigue.  HENT: Negative.  Negative for congestion and sore throat.   Eyes: Negative.  Negative for blurred vision and pain.  Respiratory: Negative.  Negative for cough and shortness of breath.   Cardiovascular: Negative.  Negative for chest pain, palpitations and leg swelling.  Gastrointestinal: Negative.  Negative for abdominal pain, blood in stool, constipation, diarrhea, heartburn, melena, nausea and vomiting.  Genitourinary: Negative.  Negative for dysuria, flank pain, frequency and urgency.  Musculoskeletal: Negative.  Negative for joint pain and myalgias.  Skin: Negative.   Neurological: Negative.  Negative for dizziness, tingling, sensory change, weakness and headaches.  Endo/Heme/Allergies: Negative.   Psychiatric/Behavioral: Negative.  Negative for depression and suicidal ideas. The patient is not nervous/anxious.        Objective:   BP 130/66   Pulse 68   Ht 5' 11 (1.803 m)   Wt 180 lb (81.6 kg)   SpO2 99%   BMI 25.10 kg/m   Vitals:   01/29/24 1137  BP: 130/66  Pulse: 68  Height: 5' 11 (1.803 m)  Weight: 180 lb (81.6 kg)  SpO2: 99%  BMI (Calculated): 25.12    Physical Exam Vitals and nursing note reviewed.  Constitutional:      General: He is not in acute distress.    Appearance: Normal appearance. He is not ill-appearing.  HENT:     Head: Normocephalic and  atraumatic.     Nose: Nose normal.     Mouth/Throat:     Mouth: Mucous membranes are moist.     Pharynx: Oropharynx is clear.  Eyes:     Conjunctiva/sclera: Conjunctivae normal.     Pupils: Pupils are equal, round, and reactive to light.  Cardiovascular:     Rate and Rhythm: Normal rate and regular rhythm.     Pulses: Normal pulses.     Heart sounds: Normal heart sounds.  Pulmonary:     Effort: Pulmonary effort is normal.     Breath sounds: Normal breath sounds. No wheezing or rhonchi.  Abdominal:     General: Bowel sounds are normal. There is no distension.     Palpations: Abdomen is soft.     Tenderness: There is no abdominal tenderness.  Musculoskeletal:        General: Normal range of motion.     Cervical back: Normal range of motion and neck supple.     Right lower leg: No edema.     Left lower leg: No edema.  Skin:    General: Skin is warm and dry.     Capillary Refill: Capillary refill takes less than 2 seconds.  Neurological:     General: No focal deficit present.     Mental Status: He is alert and oriented to person, place, and time.     Sensory: No sensory deficit.     Motor: No weakness.  Psychiatric:        Mood and Affect: Mood normal.        Behavior: Behavior normal.        Judgment: Judgment normal.      Results for orders placed or performed in visit on 01/29/24  POCT CBG (Fasting - Glucose)  Result Value Ref Range   Glucose Fasting, POC 189 (A) 70 - 99 mg/dL    Recent Results (from the past 2160 hours)  I-STAT creatinine     Status: Abnormal   Collection Time: 12/11/23  8:11 AM  Result Value Ref Range   Creatinine, Ser 1.40 (H) 0.61 - 1.24 mg/dL  CBC with Differential/Platelet     Status: Abnormal   Collection Time: 12/23/23  8:28 AM  Result Value Ref Range  WBC 7.5 3.4 - 10.8 x10E3/uL   RBC 3.78 (L) 4.14 - 5.80 x10E6/uL   Hemoglobin 11.4 (L) 13.0 - 17.7 g/dL   Hematocrit 64.4 (L) 62.4 - 51.0 %   MCV 94 79 - 97 fL   MCH 30.2 26.6 - 33.0 pg    MCHC 32.1 31.5 - 35.7 g/dL   RDW 87.5 88.3 - 84.5 %   Platelets 246 150 - 450 x10E3/uL   Neutrophils 74 Not Estab. %   Lymphs 16 Not Estab. %   Monocytes 9 Not Estab. %   Eos 1 Not Estab. %   Basos 0 Not Estab. %   Neutrophils Absolute 5.5 1.4 - 7.0 x10E3/uL   Lymphocytes Absolute 1.2 0.7 - 3.1 x10E3/uL   Monocytes Absolute 0.7 0.1 - 0.9 x10E3/uL   EOS (ABSOLUTE) 0.1 0.0 - 0.4 x10E3/uL   Basophils Absolute 0.0 0.0 - 0.2 x10E3/uL   Immature Granulocytes 0 Not Estab. %   Immature Grans (Abs) 0.0 0.0 - 0.1 x10E3/uL  CMP14+EGFR     Status: Abnormal   Collection Time: 12/23/23  8:28 AM  Result Value Ref Range   Glucose 76 70 - 99 mg/dL   BUN 34 (H) 8 - 27 mg/dL   Creatinine, Ser 8.48 (H) 0.76 - 1.27 mg/dL   eGFR 47 (L) >40 fO/fpw/8.26   BUN/Creatinine Ratio 23 10 - 24   Sodium 141 134 - 144 mmol/L   Potassium 5.4 (H) 3.5 - 5.2 mmol/L   Chloride 103 96 - 106 mmol/L   CO2 20 20 - 29 mmol/L   Calcium 9.6 8.6 - 10.2 mg/dL   Total Protein 7.3 6.0 - 8.5 g/dL   Albumin 4.5 3.8 - 4.8 g/dL   Globulin, Total 2.8 1.5 - 4.5 g/dL   Bilirubin Total 0.3 0.0 - 1.2 mg/dL   Alkaline Phosphatase 46 (L) 47 - 123 IU/L   AST 25 0 - 40 IU/L   ALT 13 0 - 44 IU/L  Hemoglobin A1c     Status: Abnormal   Collection Time: 12/23/23  8:28 AM  Result Value Ref Range   Hgb A1c MFr Bld 6.9 (H) 4.8 - 5.6 %    Comment:          Prediabetes: 5.7 - 6.4          Diabetes: >6.4          Glycemic control for adults with diabetes: <7.0    Est. average glucose Bld gHb Est-mCnc 151 mg/dL  Lipid panel     Status: Abnormal   Collection Time: 12/23/23  8:28 AM  Result Value Ref Range   Cholesterol, Total 109 100 - 199 mg/dL   Triglycerides 898 0 - 149 mg/dL   HDL 35 (L) >60 mg/dL   VLDL Cholesterol Cal 19 5 - 40 mg/dL   LDL Chol Calc (NIH) 55 0 - 99 mg/dL   Chol/HDL Ratio 3.1 0.0 - 5.0 ratio    Comment:                                   T. Chol/HDL Ratio                                             Men  Women  1/2 Avg.Risk  3.4    3.3                                   Avg.Risk  5.0    4.4                                2X Avg.Risk  9.6    7.1                                3X Avg.Risk 23.4   11.0   TSH     Status: Abnormal   Collection Time: 12/23/23  8:28 AM  Result Value Ref Range   TSH 5.110 (H) 0.450 - 4.500 uIU/mL  POCT CBG (Fasting - Glucose)     Status: Abnormal   Collection Time: 01/01/24 11:22 AM  Result Value Ref Range   Glucose Fasting, POC 169 (A) 70 - 99 mg/dL  Rad Onc Aria Session Summary     Status: None   Collection Time: 01/18/24  2:45 PM  Result Value Ref Range   Course ID [email or web address]    Course Intent Curative    Course Start Date 01/05/2024    Session Number 1    Course First Treatment Date 01/18/2024  2:43 PM    Course Last Treatment Date 01/18/2024  2:44 PM    Course Elapsed Days 0    Reference Point ID Ext_R    Reference Point Dosage Given to Date 0.5 Gy   Reference Point Session Dosage Given 0.5 Gy   Plan ID Ext_R    Plan Name Ext_Rt    Plan Fractions Treated to Date 1    Plan Total Fractions Prescribed 6    Plan Prescribed Dose Per Fraction 0.5 Gy   Plan Total Prescribed Dose 3.000000 Gy   Plan Primary Reference Point Ext_R   Rad Powell Blizzard Session Summary     Status: None   Collection Time: 01/20/24  2:25 PM  Result Value Ref Range   Course ID [email or web address]    Course Intent Curative    Course Start Date 01/05/2024    Session Number 2    Course First Treatment Date 01/18/2024  2:43 PM    Course Last Treatment Date 01/20/2024  2:24 PM    Course Elapsed Days 2    Reference Point ID Ext_R    Reference Point Dosage Given to Date 1 Gy   Reference Point Session Dosage Given 0.5 Gy   Plan ID Ext_R    Plan Name Ext_Rt    Plan Fractions Treated to Date 2    Plan Total Fractions Prescribed 6    Plan Prescribed Dose Per Fraction 0.5 Gy   Plan Total Prescribed Dose 3.000000 Gy   Plan Primary Reference Point Ext_R   TSH+T4F+T3Free     Status: None    Collection Time: 01/25/24 11:02 AM  Result Value Ref Range   TSH 3.050 0.450 - 4.500 uIU/mL   T3, Free 2.4 2.0 - 4.4 pg/mL   Free T4 1.29 0.82 - 1.77 ng/dL  Rad Onc Aria Session Summary     Status: None   Collection Time: 01/25/24  2:15 PM  Result Value Ref Range   Course ID [email or web address]    Course Intent Curative    Course Start Date 01/05/2024  Session Number 3    Course First Treatment Date 01/18/2024  2:43 PM    Course Last Treatment Date 01/25/2024  2:15 PM    Course Elapsed Days 7    Reference Point ID Ext_R    Reference Point Dosage Given to Date 1.5 Gy   Reference Point Session Dosage Given 0.5 Gy   Plan ID Ext_R    Plan Name Ext_Rt    Plan Fractions Treated to Date 3    Plan Total Fractions Prescribed 6    Plan Prescribed Dose Per Fraction 0.5 Gy   Plan Total Prescribed Dose 3.000000 Gy   Plan Primary Reference Point Ext_R   Rad Powell [place]izzard Session Summary     Status: None   Collection Time: 01/27/24  2:20 PM  Result Value Ref Range   Course ID [email or web address]    Course Intent Curative    Course Start Date 01/05/2024    Session Number 4    Course First Treatment Date 01/18/2024  2:43 PM    Course Last Treatment Date 01/27/2024  2:20 PM    Course Elapsed Days 9    Reference Point ID Ext_R    Reference Point Dosage Given to Date 2 Gy   Reference Point Session Dosage Given 0.5 Gy   Plan ID Ext_R    Plan Name Ext_Rt    Plan Fractions Treated to Date 4    Plan Total Fractions Prescribed 6    Plan Prescribed Dose Per Fraction 0.5 Gy   Plan Total Prescribed Dose 3.000000 Gy   Plan Primary Reference Point Ext_R   POCT CBG (Fasting - Glucose)     Status: Abnormal   Collection Time: 01/29/24 11:42 AM  Result Value Ref Range   Glucose Fasting, POC 189 (A) 70 - 99 mg/dL      Assessment & Plan:  Continue current medications. AWV next visit. Problem List Items Addressed This Visit       Cardiovascular and Mediastinum   Hypertension associated with diabetes (HCC)   Coronary  disease     Respiratory   COPD (chronic obstructive pulmonary disease) (HCC)     Endocrine   Combined hyperlipidemia associated with type 2 diabetes mellitus (HCC)   Acquired hypothyroidism   Type 2 diabetes mellitus with hyperglycemia, without long-term current use of insulin  (HCC) - Primary   Relevant Orders   POCT CBG (Fasting - Glucose) (Completed)    Return in about 2 months (around 03/31/2024).   Total time spent: 25 minutes. This time includes review of previous notes and results and patient face to face interaction during today's visit.    [name], MD  01/29/2024   This document may have been prepared by [facility] Voice Recognition software and as such may include unintentional dictation errors.     [1]  Allergies Allergen Reactions   Spironolactone    Statins   [2]  Outpatient Medications Prior to Visit  Medication Sig   acetaminophen  (TYLENOL ) 650 MG CR tablet Take 1,300 mg by mouth 2 (two) times daily.   aspirin  EC 81 MG tablet Take 81 mg by mouth daily.   carvedilol  (COREG ) 12.5 MG tablet TAKE 1 TABLET BY MOUTH 2 TIMES DAILY WITH A MEAL   chlorthalidone (HYGROTON) 25 MG tablet Take 25 mg by mouth daily.   ezetimibe  (ZETIA ) 10 MG tablet TAKE 1 TABLET BY MOUTH DAILY   gabapentin  (NEURONTIN ) 300 MG capsule Take one tablet by mouth at 12pm daily   gabapentin  (NEURONTIN ) 600 MG tablet TAKE  1 TABLET BY MOUTH TWICE A DAY   glucose blood (CONTOUR NEXT TEST) test strip USE TO TEST SUGARS 3 TIMES DAILY   insulin  glargine, 2 Unit Dial, (TOUJEO  MAX SOLOSTAR) 300 UNIT/ML Solostar Pen Inject 40 Units into the skin at bedtime.   JARDIANCE  25 MG TABS tablet TAKE ONE TABLET BY MOUTH DAILY   levothyroxine  (SYNTHROID ) 25 MCG tablet Take 0.5 tablets (12.5 mcg total) by mouth daily before breakfast.   Magnesium  400 MG CAPS Take by mouth.   metFORMIN  (GLUCOPHAGE ) 1000 MG tablet TAKE 1 TABLET TWICE DAILY   NEXLETOL  180 MG TABS TAKE 1 TABLET BY MOUTH DAILY   Omega-3 Fatty Acids  (FISH OIL) 1200 MG CAPS Take 1,200 mg by mouth 2 (two) times daily.   omeprazole  (PRILOSEC) 20 MG capsule TAKE 1 CAPSULE EVERY DAY   vitamin B-12 (CYANOCOBALAMIN ) 500 MCG tablet Take 500 mcg by mouth daily.   No facility-administered medications prior to visit.

## 2024-01-29 NOTE — Telephone Encounter (Signed)
 Patient wife informed that the patients patient assistance toujeo  came in today and is in the fridge at the DS Nurse station

## 2024-01-29 NOTE — Progress Notes (Signed)
° °  01/29/2024  Patient ID: Erik Collins, male   DOB: [birth year], 78 y.o.   MRN: [id number]  Patient's healthwell grant for Nexletol  has been re-approved through 02/18/2025  Card info placed below for reference:   Erik Collins, PharmD Clinical Pharmacist [phone]

## 2024-02-01 ENCOUNTER — Ambulatory Visit
Admission: RE | Admit: 2024-02-01 | Discharge: 2024-02-01 | Disposition: A | Source: Ambulatory Visit | Attending: Radiation Oncology | Admitting: Radiation Oncology

## 2024-02-01 ENCOUNTER — Other Ambulatory Visit: Payer: Self-pay

## 2024-02-01 DIAGNOSIS — Z510 Encounter for antineoplastic radiation therapy: Secondary | ICD-10-CM | POA: Diagnosis not present

## 2024-02-01 DIAGNOSIS — M1711 Unilateral primary osteoarthritis, right knee: Secondary | ICD-10-CM | POA: Diagnosis not present

## 2024-02-01 DIAGNOSIS — C44311 Basal cell carcinoma of skin of nose: Secondary | ICD-10-CM | POA: Diagnosis not present

## 2024-02-01 LAB — RAD ONC ARIA SESSION SUMMARY
Course Elapsed Days: 14
Plan Fractions Treated to Date: 5
Plan Prescribed Dose Per Fraction: 0.5 Gy
Plan Total Fractions Prescribed: 6
Plan Total Prescribed Dose: 3 Gy
Reference Point Dosage Given to Date: 2.5 Gy
Reference Point Session Dosage Given: 0.5 Gy
Session Number: 5

## 2024-02-03 ENCOUNTER — Ambulatory Visit
Admission: RE | Admit: 2024-02-03 | Discharge: 2024-02-03 | Attending: Radiation Oncology | Admitting: Radiation Oncology

## 2024-02-03 ENCOUNTER — Other Ambulatory Visit: Payer: Self-pay

## 2024-02-03 DIAGNOSIS — C44311 Basal cell carcinoma of skin of nose: Secondary | ICD-10-CM | POA: Diagnosis not present

## 2024-02-03 LAB — RAD ONC ARIA SESSION SUMMARY
Course Elapsed Days: 16
Plan Fractions Treated to Date: 6
Plan Prescribed Dose Per Fraction: 0.5 Gy
Plan Total Fractions Prescribed: 6
Plan Total Prescribed Dose: 3 Gy
Reference Point Dosage Given to Date: 3 Gy
Reference Point Session Dosage Given: 0.5 Gy
Session Number: 6

## 2024-02-04 NOTE — Radiation Completion Notes (Signed)
 Patient Name: Erik Collins, Erik Collins MRN: [id number] Date of Birth: [birth year] Referring Physician: FREDY BATHE, M.D. Date of Service: 2024-02-04 Radiation Oncologist: Marcey Penton, M.D. [facility]                             RADIATION ONCOLOGY END OF TREATMENT NOTE     Diagnosis: M17.11 Unilateral primary osteoarthritis, right knee Intent: Curative     HPI: Patient is a 78 year old male now out 3 years completing SBRT to his right upper lobe for non-small cell lung cancer.  He had some other lesions on CT scan in the past which have resolved.  He is seen today in routine follow-up specifically denies cough hemoptysis chest tightness or any change in his pulmonary status..  Recent CT scan of his chest showed unchanged postoperative posttreatment appearance of the chest status post left lower lobectomy assist with scarring and atelectasis.  He also has fibrosis of the right upper lobe consistent with SBRT prior treatment.  No evidence of recurrent or metastatic disease.  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      ==========DELIVERED PLANS==========  First Treatment Date: 2024-01-18 Last Treatment Date: 2024-02-03   Plan Name: Ext_R Site: Knee, Right Technique: Isodose Plan Mode: Photon Dose Per Fraction: 0.5 Gy Prescribed Dose (Delivered / Prescribed): 3 Gy / 3 Gy Prescribed Fxs (Delivered / Prescribed): 6 / 6     ==========ON TREATMENT VISIT DATES========== 2024-01-20     ==========UPCOMING VISITS========== 03/31/2024 [facility] MEDICARE WELL VISIT Bathe Fredy RAMAN, MD  03/17/2024 [facility] OFFICE VISIT Bathe Denyse LABOR, MD  03/10/2024 [facility] RAD ONCOLOGY FOLLOW UP 30 Chrystal, Marcey, MD        ==========APPENDIX - ON TREATMENT VISIT  NOTES==========   See weekly On Treatment Notes in Epic for details in the Media tab (listed as Progress notes on the On Treatment Visit Dates listed above).

## 2024-02-10 NOTE — Progress Notes (Signed)
" ° °  02/10/2024  Patient ID: Dempsey LOISE Shines, male   DOB: September 01, 1945, 78 y.o.   MRN: [id number]  Received notification from [facility] that patient's renewal application for Jardiance  has been approved through 02/16/2025.  [name], PharmD Clinical Pharmacist [phone]  "

## 2024-02-16 ENCOUNTER — Telehealth: Payer: Self-pay

## 2024-02-16 NOTE — Progress Notes (Signed)
" ° °  02/16/2024  Patient ID: Erik Collins, male   DOB: [birth year], 78 y.o.   MRN: [id number]  Contacted [employer] to follow up on status of patient's 2026 renewal app for Toujeo  [employer].  Spoke with company rep who states they have received the application but cannot begin processing until the new year 02/19/24. Will notify of decision within 5-7 business days from that date.  [name], PharmD Clinical Pharmacist [phone]  "

## 2024-02-23 ENCOUNTER — Telehealth: Payer: Self-pay

## 2024-02-23 NOTE — Progress Notes (Signed)
" ° °  02/23/2024  Patient ID: Erik Collins, male   DOB: 08-11-1945, 79 y.o.   MRN: [id number]  Contacted [employer] to follow up on status of Toujeo  2026 renewal application.   Application is processing. Rep states we should receive a decision in 3-5 business days. She reports 5 boxes were shipped on 01/29/24 so patient should have good supply on hand while this waits to be approved.  [name], PharmD Clinical Pharmacist [phone]  "

## 2024-03-01 ENCOUNTER — Telehealth: Payer: Self-pay

## 2024-03-01 NOTE — Progress Notes (Signed)
" ° °  03/01/2024  Patient ID: Erik Collins, male   DOB: [birth year], 79 y.o.   MRN: [id number]  Contacted [employer] to follow up on status of Toujeo  2026 renewal application.   Patient has been APPROVED to continue to receive Toujeo  through 02/16/25.  [name], PharmD Clinical Pharmacist [phone]  "

## 2024-03-07 ENCOUNTER — Ambulatory Visit: Admitting: Radiation Oncology

## 2024-03-10 ENCOUNTER — Encounter: Payer: Self-pay | Admitting: Radiation Oncology

## 2024-03-10 ENCOUNTER — Ambulatory Visit
Admission: RE | Admit: 2024-03-10 | Discharge: 2024-03-10 | Disposition: A | Discharge status: Home / Self Care | Source: Ambulatory Visit | Attending: Radiation Oncology | Admitting: Radiation Oncology

## 2024-03-10 VITALS — BP 141/72 | HR 63 | Temp 97.0°F | Resp 16 | Ht 71.0 in | Wt 187.8 lb

## 2024-03-10 DIAGNOSIS — M1711 Unilateral primary osteoarthritis, right knee: Secondary | ICD-10-CM | POA: Insufficient documentation

## 2024-03-10 DIAGNOSIS — Z923 Personal history of irradiation: Secondary | ICD-10-CM | POA: Diagnosis not present

## 2024-03-10 DIAGNOSIS — G629 Polyneuropathy, unspecified: Secondary | ICD-10-CM | POA: Insufficient documentation

## 2024-03-10 DIAGNOSIS — Z85118 Personal history of other malignant neoplasm of bronchus and lung: Secondary | ICD-10-CM | POA: Diagnosis not present

## 2024-03-10 NOTE — Progress Notes (Signed)
 Radiation Oncology Follow up Note  Name: Erik Collins   Date:   03/10/2024 MRN:  [id number] DOB: 06/29/1945    This 79 y.o. male presents to the clinic today for 1 month follow-up status post LDR 4 osteoarthritis of the right knee.  Patient is a 79 year old male now out 1 month having completed LDR for osteoarthritis of the right knee.  Is also status post SBRT treatment to his right upper lobe for non-small cell lung cancer.  REFERRING PROVIDER: [name], MD  HPI: Seen today in follow-up he states he has about a 30% pain reduction.  He does take gabapentin  for peripheral neuropathy which he says maybe adds to some of his knee pain.  He is not have any problems with flexion of the knee does continue to wear a soft knee type brace..  COMPLICATIONS OF TREATMENT: none  FOLLOW UP COMPLIANCE: keeps appointments   PHYSICAL EXAM:  BP (!) 141/72   Pulse 63   Temp (!) 97 F (36.1 C)   Resp 16   Ht 5' 11 (1.803 m)   Wt 187 lb 12.8 oz (85.2 kg)   BMI 26.19 kg/m  Flexion of the knee does not elicit pain motor and sensory levels are equal and symmetric in the lower extremities range of motion of his lower extremities does not elicit pain.  Proprioception is intact.  RADIOLOGY RESULTS: No current films for review  PLAN: Present time patient is received states he is has about a 30% pain reduction in his right knee.  He continues on gabapentin  for his peripheral neuropathy.  I have asked to see him back in 6 months for follow-up.  I have explained to him over time he may still see some improvement in pain.  Patient knows to call sooner with any concerns.  I would like to take this opportunity to thank you for allowing me to participate in the care of your patient.SABRA Marcey Penton, MD

## 2024-03-17 ENCOUNTER — Encounter: Payer: Self-pay | Admitting: Cardiovascular Disease

## 2024-03-17 ENCOUNTER — Ambulatory Visit: Admitting: Cardiovascular Disease

## 2024-03-17 ENCOUNTER — Other Ambulatory Visit: Payer: Self-pay | Admitting: Internal Medicine

## 2024-03-17 VITALS — BP 140/66 | HR 63 | Ht 71.0 in | Wt 180.0 lb

## 2024-03-17 DIAGNOSIS — E1169 Type 2 diabetes mellitus with other specified complication: Secondary | ICD-10-CM | POA: Diagnosis not present

## 2024-03-17 DIAGNOSIS — I152 Hypertension secondary to endocrine disorders: Secondary | ICD-10-CM

## 2024-03-17 DIAGNOSIS — Z6825 Body mass index (BMI) 25.0-25.9, adult: Secondary | ICD-10-CM

## 2024-03-17 DIAGNOSIS — E663 Overweight: Secondary | ICD-10-CM | POA: Diagnosis not present

## 2024-03-17 DIAGNOSIS — E1159 Type 2 diabetes mellitus with other circulatory complications: Secondary | ICD-10-CM | POA: Diagnosis not present

## 2024-03-17 DIAGNOSIS — I50812 Chronic right heart failure: Secondary | ICD-10-CM | POA: Diagnosis not present

## 2024-03-17 DIAGNOSIS — I25119 Atherosclerotic heart disease of native coronary artery with unspecified angina pectoris: Secondary | ICD-10-CM | POA: Diagnosis not present

## 2024-03-17 DIAGNOSIS — E782 Mixed hyperlipidemia: Secondary | ICD-10-CM | POA: Diagnosis not present

## 2024-03-17 DIAGNOSIS — I340 Nonrheumatic mitral (valve) insufficiency: Secondary | ICD-10-CM | POA: Diagnosis not present

## 2024-03-17 DIAGNOSIS — I6523 Occlusion and stenosis of bilateral carotid arteries: Secondary | ICD-10-CM

## 2024-03-17 DIAGNOSIS — N1831 Chronic kidney disease, stage 3a: Secondary | ICD-10-CM | POA: Diagnosis not present

## 2024-03-17 NOTE — Progress Notes (Signed)
 "     Cardiology Office Note   Date:  03/17/2024   ID:  Erik Collins, Erik Collins Nov 12, 1945, MRN [id number]  PCP:  Erik Fredy RAMAN, MD  Cardiologist:  [name], MD      History of Present Illness: Erik Collins is a 79 y.o. male who presents for  Chief Complaint  Patient presents with   Follow-up    3 month follow up     No chest pain or SOB.      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   R nose lateral supratip - referred for radiation oncology   Cancer [facility])    hx lung cancer   CHF (congestive heart failure) (HCC)    Complication of anesthesia    knee surgery caused arrythmia requiring a temporary pacemaker   COPD (chronic obstructive pulmonary disease) (HCC)    Coronary artery disease    s/p CABG   Diabetes mellitus without complication (HCC)    Diverticulosis    Duodenitis    Dysrhythmia    during total knee replacement patient required a temporary pacemaker   GERD (gastroesophageal reflux disease)    Hypercholesterolemia    Hyperplastic colon polyp    Hypertension    Internal hemorrhoids    Lung cancer (HCC)    Lung cancer (HCC)    Myocardial infarction (HCC)    09/2009 f/up with Dr. Denyse Erik   Sleep apnea      Past Surgical History:  Procedure Laterality Date   BACK SURGERY     CATARACT EXTRACTION W/PHACO Left 11/09/2019   Procedure: CATARACT EXTRACTION PHACO AND INTRAOCULAR LENS PLACEMENT (IOC) LEFT DIABETIC MALYUGIN 5.91 01:01.0 9.6%;  Surgeon: Mittie Gaskin, MD;  Location: [facility];  Service: Ophthalmology;  Laterality: Left;   CATARACT EXTRACTION W/PHACO Right 12/07/2019   Procedure: CATARACT EXTRACTION PHACO AND INTRAOCULAR LENS PLACEMENT (IOC) RIGHT DIABETIC MALYUGIN 6.38 01:05.1 9.8%;  Surgeon: Mittie Gaskin, MD;  Location: [facility];  Service: Ophthalmology;  Laterality: Right;   COLONOSCOPY N/A 11/23/2019   Procedure: COLONOSCOPY;  Surgeon:  Toledo, Ladell POUR, MD;  Location: [facility] ENDOSCOPY;  Service: Gastroenterology;  Laterality: N/A;   COLONOSCOPY WITH PROPOFOL  N/A 11/09/2014   Procedure: COLONOSCOPY WITH PROPOFOL ;  Surgeon: Donnice Vaughn Manes, MD;  Location: [facility] ENDOSCOPY;  Service: Endoscopy;  Laterality: N/A;   CORONARY ANGIOPLASTY     CORONARY ARTERY BYPASS GRAFT  09/2009   quadruple, done at [facility]   CORONARY ARTERY BYPASS GRAFT     ESOPHAGOGASTRODUODENOSCOPY (EGD) WITH PROPOFOL  N/A 11/09/2014   Procedure: ESOPHAGOGASTRODUODENOSCOPY (EGD) WITH PROPOFOL ;  Surgeon: Donnice Vaughn Manes, MD;  Location: [facility] ENDOSCOPY;  Service: Endoscopy;  Laterality: N/A;   left lower lobectomy  09/2009   at [facility] LAMINECTOMY/DECOMPRESSION MICRODISCECTOMY  12/17/2011   Procedure: LUMBAR LAMINECTOMY/DECOMPRESSION MICRODISCECTOMY 1 LEVEL;  Surgeon: Alm Collins Molt, MD;  Location: [facility] NEURO ORS;  Service: Neurosurgery;  Laterality: Right;  Right Lumbar five-sacral one microdiscectomy   TEMPORARY PACEMAKER Right 07/10/2017   Procedure: TEMPORARY PACEMAKER;  Surgeon: Ammon Blunt, MD;  Location: [facility] INVASIVE CV LAB;  Service: Cardiovascular;  Laterality: Right;   TOTAL KNEE ARTHROPLASTY Left 07/08/2017   Procedure: TOTAL KNEE ARTHROPLASTY;  Surgeon: Cleotilde Barrio, MD;  Location: [facility] ORS;  Service: Orthopedics;  Laterality: Left;     Current Outpatient Medications  Medication Sig Dispense Refill   acetaminophen  (TYLENOL ) 650 MG CR tablet Take 1,300 mg by mouth  2 (two) times daily.     aspirin  EC 81 MG tablet Take 81 mg by mouth daily.     carvedilol  (COREG ) 12.5 MG tablet TAKE 1 TABLET BY MOUTH 2 TIMES DAILY WITH A MEAL 180 tablet 3   chlorthalidone (HYGROTON) 25 MG tablet Take 25 mg by mouth daily.     ezetimibe  (ZETIA ) 10 MG tablet TAKE 1 TABLET BY MOUTH DAILY 90 tablet 1   gabapentin  (NEURONTIN ) 300 MG capsule Take one tablet by mouth at 12pm daily 90 capsule 1   gabapentin  (NEURONTIN ) 600 MG tablet TAKE 1 TABLET BY MOUTH TWICE A  DAY 180 tablet 3   glucose blood (CONTOUR NEXT TEST) test strip USE TO TEST SUGARS 3 TIMES DAILY 200 each 6   insulin  glargine, 2 Unit Dial, (TOUJEO  MAX SOLOSTAR) 300 UNIT/ML Solostar Pen Inject 40 Units into the skin at bedtime. 90 mL 1   JARDIANCE  25 MG TABS tablet TAKE ONE TABLET BY MOUTH DAILY 90 tablet 2   levothyroxine  (SYNTHROID ) 25 MCG tablet Take 0.5 tablets (12.5 mcg total) by mouth daily before breakfast. 15 tablet 3   Magnesium  400 MG CAPS Take by mouth.     metFORMIN  (GLUCOPHAGE ) 1000 MG tablet TAKE 1 TABLET TWICE DAILY 180 tablet 3   NEXLETOL  180 MG TABS TAKE 1 TABLET BY MOUTH DAILY 90 tablet 3   Omega-3 Fatty Acids (FISH OIL) 1200 MG CAPS Take 1,200 mg by mouth 2 (two) times daily.     omeprazole  (PRILOSEC) 20 MG capsule TAKE 1 CAPSULE EVERY DAY 90 capsule 3   vitamin B-12 (CYANOCOBALAMIN ) 500 MCG tablet Take 500 mcg by mouth daily.     No current facility-administered medications for this visit.    Allergies:   Spironolactone and Statins    Social History:   reports that he quit smoking about 14 years ago. His smoking use included cigarettes. He has never used smokeless tobacco. He reports that he does not drink alcohol and does not use drugs.   Family History:  family history includes Diabetes in his mother; Heart attack in his father; Heart disease in his father and mother; Stroke in his father.    ROS:     Review of Systems  Constitutional: Negative.   HENT: Negative.    Eyes: Negative.   Respiratory: Negative.    Gastrointestinal: Negative.   Genitourinary: Negative.   Musculoskeletal: Negative.   Skin: Negative.   Neurological: Negative.   Endo/Heme/Allergies: Negative.   Psychiatric/Behavioral: Negative.    All other systems reviewed and are negative.     All other systems are reviewed and negative.    PHYSICAL EXAM: VS:  BP (!) 140/66   Pulse 63   Ht 5' 11 (1.803 m)   Wt 180 lb (81.6 kg)   SpO2 97%   BMI 25.10 kg/m  , BMI Body mass index is  25.1 kg/m. Last weight:  Wt Readings from Last 3 Encounters:  03/17/24 180 lb (81.6 kg)  03/10/24 187 lb 12.8 oz (85.2 kg)  01/29/24 180 lb (81.6 kg)     Physical Exam Vitals reviewed.  Constitutional:      Appearance: Normal appearance. He is normal weight.  HENT:     Head: Normocephalic.     Nose: Nose normal.     Mouth/Throat:     Mouth: Mucous membranes are moist.  Eyes:     Pupils: Pupils are equal, round, and reactive to light.  Cardiovascular:     Rate and Rhythm: Normal rate  and regular rhythm.     Pulses: Normal pulses.     Heart sounds: Normal heart sounds.  Pulmonary:     Effort: Pulmonary effort is normal.  Abdominal:     General: Abdomen is flat. Bowel sounds are normal.  Musculoskeletal:        General: Normal range of motion.     Cervical back: Normal range of motion.  Skin:    General: Skin is warm.  Neurological:     General: No focal deficit present.     Mental Status: He is alert.  Psychiatric:        Mood and Affect: Mood normal.       EKG:   Recent Labs: 12/23/2023: ALT 13; BUN 34; Creatinine, Ser 1.51; Hemoglobin 11.4; Platelets 246; Potassium 5.4; Sodium 141 01/25/2024: TSH 3.050    Lipid Panel    Component Value Date/Time   CHOL 109 12/23/2023 0828   TRIG 101 12/23/2023 0828   HDL 35 (L) 12/23/2023 0828   CHOLHDL 3.1 12/23/2023 0828   LDLCALC 55 12/23/2023 0828      Other studies Reviewed: Additional studies/ records that were reviewed today include:  Review of the above records demonstrates:       No data to display            ASSESSMENT AND PLAN:    ICD-10-CM   1. Coronary artery disease involving native coronary artery of native heart with angina pectoris  I25.119 PCV ECHOCARDIOGRAM COMPLETE   No chest pain or SOB.    2. Hypertension associated with diabetes (HCC)  E11.59 PCV ECHOCARDIOGRAM COMPLETE   I15.2     3. Combined hyperlipidemia associated with type 2 diabetes mellitus (HCC)  E11.69 PCV ECHOCARDIOGRAM  COMPLETE   E78.2     4. Bilateral carotid artery stenosis  I65.23 PCV ECHOCARDIOGRAM COMPLETE    5. Overweight with body mass index (BMI) of 25 to 25.9 in adult  E66.3 PCV ECHOCARDIOGRAM COMPLETE   Z68.25     6. CKD stage 3a, GFR 45-59 ml/min (HCC)  N18.31 PCV ECHOCARDIOGRAM COMPLETE    7. Mixed hyperlipidemia  E78.2 PCV ECHOCARDIOGRAM COMPLETE    8. Chronic right-sided congestive heart failure (HCC)  I50.812 PCV ECHOCARDIOGRAM COMPLETE    9. Nonrheumatic mitral valve regurgitation  I34.0 PCV ECHOCARDIOGRAM COMPLETE   mild to moderate       Problem List Items Addressed This Visit       Cardiovascular and Mediastinum   Carotid stenosis   Relevant Orders   PCV ECHOCARDIOGRAM COMPLETE   Hypertension associated with diabetes (HCC)   Relevant Orders   PCV ECHOCARDIOGRAM COMPLETE   Congestive heart failure (HCC)   Relevant Orders   PCV ECHOCARDIOGRAM COMPLETE   Coronary disease - Primary   Relevant Orders   PCV ECHOCARDIOGRAM COMPLETE     Endocrine   Combined hyperlipidemia associated with type 2 diabetes mellitus (HCC)   Relevant Orders   PCV ECHOCARDIOGRAM COMPLETE     Genitourinary   CKD stage 3a, GFR 45-59 ml/min (HCC)   Relevant Orders   PCV ECHOCARDIOGRAM COMPLETE     Other   Hyperlipidemia   Relevant Orders   PCV ECHOCARDIOGRAM COMPLETE   Overweight with body mass index (BMI) of 25 to 25.9 in adult   Relevant Orders   PCV ECHOCARDIOGRAM COMPLETE   Other Visit Diagnoses       Nonrheumatic mitral valve regurgitation       mild to moderate   Relevant Orders   PCV  ECHOCARDIOGRAM COMPLETE          Disposition:   Return in about 3 months (around 06/15/2024) for echo prior to f/u.    Total time spent: 35 minutes  Signed,  Denyse Bathe, MD  03/17/2024 10:39 AM    [facility] "

## 2024-03-31 ENCOUNTER — Ambulatory Visit: Admitting: Internal Medicine

## 2024-04-15 ENCOUNTER — Other Ambulatory Visit

## 2024-06-09 ENCOUNTER — Ambulatory Visit: Admitting: Cardiovascular Disease

## 2024-08-04 ENCOUNTER — Ambulatory Visit: Admitting: Dermatology

## 2024-09-15 ENCOUNTER — Ambulatory Visit: Admitting: Radiation Oncology
